# Patient Record
Sex: Female | Born: 1935 | Race: Black or African American | Hispanic: No | Smoking: Former smoker
Health system: Southern US, Community
[De-identification: ages and names within clinical notes are randomized; demographics above are authoritative.]

## PROBLEM LIST (undated history)

## (undated) DIAGNOSIS — E870 Hyperosmolality and hypernatremia: Secondary | ICD-10-CM

## (undated) DIAGNOSIS — Z8543 Personal history of malignant neoplasm of ovary: Secondary | ICD-10-CM

## (undated) DIAGNOSIS — C569 Malignant neoplasm of unspecified ovary: Secondary | ICD-10-CM

## (undated) DIAGNOSIS — C50919 Malignant neoplasm of unspecified site of unspecified female breast: Secondary | ICD-10-CM

## (undated) DIAGNOSIS — J309 Allergic rhinitis, unspecified: Secondary | ICD-10-CM

## (undated) DIAGNOSIS — C801 Malignant (primary) neoplasm, unspecified: Secondary | ICD-10-CM

## (undated) DIAGNOSIS — F329 Major depressive disorder, single episode, unspecified: Secondary | ICD-10-CM

## (undated) DIAGNOSIS — E1165 Type 2 diabetes mellitus with hyperglycemia: Secondary | ICD-10-CM

## (undated) DIAGNOSIS — D638 Anemia in other chronic diseases classified elsewhere: Secondary | ICD-10-CM

## (undated) DIAGNOSIS — G309 Alzheimer's disease, unspecified: Secondary | ICD-10-CM

## (undated) DIAGNOSIS — F32A Depression, unspecified: Secondary | ICD-10-CM

## (undated) DIAGNOSIS — F0280 Dementia in other diseases classified elsewhere without behavioral disturbance: Secondary | ICD-10-CM

## (undated) DIAGNOSIS — E785 Hyperlipidemia, unspecified: Secondary | ICD-10-CM

## (undated) HISTORY — PX: BACK SURGERY: SHX140

## (undated) HISTORY — DX: Allergic rhinitis, unspecified: J30.9

## (undated) HISTORY — DX: Hypocalcemia: E83.51

## (undated) HISTORY — DX: Malignant (primary) neoplasm, unspecified: C80.1

## (undated) HISTORY — DX: Anemia in other chronic diseases classified elsewhere: D63.8

## (undated) HISTORY — DX: Alzheimer's disease, unspecified: G30.9

## (undated) HISTORY — DX: Major depressive disorder, single episode, unspecified: F32.9

## (undated) HISTORY — PX: BREAST SURGERY: SHX581

## (undated) HISTORY — DX: Depression, unspecified: F32.A

## (undated) HISTORY — DX: Personal history of malignant neoplasm of ovary: Z85.43

## (undated) HISTORY — DX: Malignant neoplasm of unspecified ovary: C56.9

## (undated) HISTORY — DX: Hyperlipidemia, unspecified: E78.5

## (undated) HISTORY — PX: KNEE SURGERY: SHX244

## (undated) HISTORY — DX: Dementia in other diseases classified elsewhere without behavioral disturbance: F02.80

## (undated) HISTORY — DX: Type 2 diabetes mellitus with hyperglycemia: E11.65

## (undated) HISTORY — DX: Malignant neoplasm of unspecified site of unspecified female breast: C50.919

## (undated) HISTORY — PX: SKIN CANCER EXCISION: SHX779

---

## 1997-10-25 ENCOUNTER — Encounter: Admission: RE | Admit: 1997-10-25 | Discharge: 1998-01-23 | Payer: Self-pay | Admitting: Internal Medicine

## 1997-11-27 ENCOUNTER — Other Ambulatory Visit: Admission: RE | Admit: 1997-11-27 | Discharge: 1997-11-27 | Payer: Self-pay | Admitting: Obstetrics and Gynecology

## 1997-12-22 ENCOUNTER — Ambulatory Visit (HOSPITAL_COMMUNITY): Admission: RE | Admit: 1997-12-22 | Discharge: 1997-12-22 | Payer: Self-pay | Admitting: Pulmonary Disease

## 1997-12-22 ENCOUNTER — Encounter: Payer: Self-pay | Admitting: Pulmonary Disease

## 1998-01-23 ENCOUNTER — Encounter: Admission: RE | Admit: 1998-01-23 | Discharge: 1998-04-06 | Payer: Self-pay | Admitting: Internal Medicine

## 1998-04-04 ENCOUNTER — Other Ambulatory Visit: Admission: RE | Admit: 1998-04-04 | Discharge: 1998-04-04 | Payer: Self-pay | Admitting: Obstetrics and Gynecology

## 1998-05-22 ENCOUNTER — Encounter: Admission: RE | Admit: 1998-05-22 | Discharge: 1998-08-20 | Payer: Self-pay | Admitting: Pulmonary Disease

## 1999-04-05 ENCOUNTER — Other Ambulatory Visit: Admission: RE | Admit: 1999-04-05 | Discharge: 1999-04-05 | Payer: Self-pay | Admitting: Obstetrics and Gynecology

## 1999-09-23 ENCOUNTER — Other Ambulatory Visit: Admission: RE | Admit: 1999-09-23 | Discharge: 1999-09-23 | Payer: Self-pay | Admitting: Obstetrics and Gynecology

## 2000-05-21 ENCOUNTER — Ambulatory Visit (HOSPITAL_COMMUNITY): Admission: RE | Admit: 2000-05-21 | Discharge: 2000-05-21 | Payer: Self-pay | Admitting: Obstetrics and Gynecology

## 2000-05-21 ENCOUNTER — Encounter: Payer: Self-pay | Admitting: Obstetrics and Gynecology

## 2000-06-17 ENCOUNTER — Encounter (INDEPENDENT_AMBULATORY_CARE_PROVIDER_SITE_OTHER): Payer: Self-pay | Admitting: Specialist

## 2000-06-17 ENCOUNTER — Other Ambulatory Visit: Admission: RE | Admit: 2000-06-17 | Discharge: 2000-06-17 | Payer: Self-pay | Admitting: Gastroenterology

## 2000-06-18 ENCOUNTER — Other Ambulatory Visit: Admission: RE | Admit: 2000-06-18 | Discharge: 2000-06-18 | Payer: Self-pay | Admitting: Obstetrics and Gynecology

## 2000-07-23 ENCOUNTER — Encounter: Payer: Self-pay | Admitting: Neurosurgery

## 2000-07-23 ENCOUNTER — Ambulatory Visit: Admission: RE | Admit: 2000-07-23 | Discharge: 2000-07-23 | Payer: Self-pay | Admitting: Neurosurgery

## 2000-08-17 ENCOUNTER — Encounter: Payer: Self-pay | Admitting: Neurosurgery

## 2000-08-19 ENCOUNTER — Encounter: Payer: Self-pay | Admitting: Neurosurgery

## 2000-08-19 ENCOUNTER — Inpatient Hospital Stay (HOSPITAL_COMMUNITY): Admission: RE | Admit: 2000-08-19 | Discharge: 2000-08-21 | Payer: Self-pay | Admitting: Neurosurgery

## 2000-08-26 ENCOUNTER — Inpatient Hospital Stay (HOSPITAL_COMMUNITY): Admission: RE | Admit: 2000-08-26 | Discharge: 2000-09-02 | Payer: Self-pay | Admitting: Neurosurgery

## 2001-05-25 ENCOUNTER — Ambulatory Visit (HOSPITAL_COMMUNITY): Admission: RE | Admit: 2001-05-25 | Discharge: 2001-05-25 | Payer: Self-pay | Admitting: Obstetrics and Gynecology

## 2001-05-25 ENCOUNTER — Encounter: Payer: Self-pay | Admitting: Obstetrics and Gynecology

## 2001-05-28 ENCOUNTER — Other Ambulatory Visit: Admission: RE | Admit: 2001-05-28 | Discharge: 2001-05-28 | Payer: Self-pay | Admitting: Obstetrics and Gynecology

## 2001-06-03 ENCOUNTER — Encounter: Admission: RE | Admit: 2001-06-03 | Discharge: 2001-06-03 | Payer: Self-pay | Admitting: Obstetrics and Gynecology

## 2001-06-03 ENCOUNTER — Encounter: Payer: Self-pay | Admitting: Obstetrics and Gynecology

## 2001-09-24 ENCOUNTER — Encounter: Payer: Self-pay | Admitting: Orthopedic Surgery

## 2001-09-24 ENCOUNTER — Ambulatory Visit (HOSPITAL_COMMUNITY): Admission: RE | Admit: 2001-09-24 | Discharge: 2001-09-24 | Payer: Self-pay | Admitting: Orthopedic Surgery

## 2001-11-12 ENCOUNTER — Encounter: Payer: Self-pay | Admitting: Orthopedic Surgery

## 2001-11-12 ENCOUNTER — Encounter: Admission: RE | Admit: 2001-11-12 | Discharge: 2001-11-12 | Payer: Self-pay | Admitting: Orthopedic Surgery

## 2001-11-16 ENCOUNTER — Ambulatory Visit (HOSPITAL_BASED_OUTPATIENT_CLINIC_OR_DEPARTMENT_OTHER): Admission: RE | Admit: 2001-11-16 | Discharge: 2001-11-17 | Payer: Self-pay | Admitting: Orthopedic Surgery

## 2001-11-17 ENCOUNTER — Inpatient Hospital Stay (HOSPITAL_COMMUNITY): Admission: AD | Admit: 2001-11-17 | Discharge: 2001-11-19 | Payer: Self-pay | Admitting: Orthopedic Surgery

## 2002-05-30 ENCOUNTER — Ambulatory Visit (HOSPITAL_COMMUNITY): Admission: RE | Admit: 2002-05-30 | Discharge: 2002-05-30 | Payer: Self-pay | Admitting: Obstetrics and Gynecology

## 2002-05-30 ENCOUNTER — Encounter: Payer: Self-pay | Admitting: Obstetrics and Gynecology

## 2002-08-08 ENCOUNTER — Ambulatory Visit (HOSPITAL_BASED_OUTPATIENT_CLINIC_OR_DEPARTMENT_OTHER): Admission: RE | Admit: 2002-08-08 | Discharge: 2002-08-08 | Payer: Self-pay | Admitting: Pulmonary Disease

## 2002-09-01 ENCOUNTER — Ambulatory Visit (HOSPITAL_BASED_OUTPATIENT_CLINIC_OR_DEPARTMENT_OTHER): Admission: RE | Admit: 2002-09-01 | Discharge: 2002-09-01 | Payer: Self-pay | Admitting: Orthopedic Surgery

## 2003-05-29 ENCOUNTER — Encounter: Admission: RE | Admit: 2003-05-29 | Discharge: 2003-06-13 | Payer: Self-pay | Admitting: Neurology

## 2003-05-31 ENCOUNTER — Ambulatory Visit (HOSPITAL_COMMUNITY): Admission: RE | Admit: 2003-05-31 | Discharge: 2003-05-31 | Payer: Self-pay | Admitting: Obstetrics and Gynecology

## 2003-06-07 ENCOUNTER — Encounter: Admission: RE | Admit: 2003-06-07 | Discharge: 2003-06-07 | Payer: Self-pay | Admitting: Obstetrics and Gynecology

## 2003-06-07 ENCOUNTER — Encounter (INDEPENDENT_AMBULATORY_CARE_PROVIDER_SITE_OTHER): Payer: Self-pay | Admitting: *Deleted

## 2003-06-14 ENCOUNTER — Encounter (HOSPITAL_COMMUNITY): Admission: RE | Admit: 2003-06-14 | Discharge: 2003-09-12 | Payer: Self-pay | Admitting: General Surgery

## 2003-07-03 ENCOUNTER — Ambulatory Visit (HOSPITAL_COMMUNITY): Admission: RE | Admit: 2003-07-03 | Discharge: 2003-07-04 | Payer: Self-pay | Admitting: General Surgery

## 2003-07-03 ENCOUNTER — Encounter: Admission: RE | Admit: 2003-07-03 | Discharge: 2003-07-03 | Payer: Self-pay | Admitting: General Surgery

## 2003-07-03 ENCOUNTER — Encounter (INDEPENDENT_AMBULATORY_CARE_PROVIDER_SITE_OTHER): Payer: Self-pay | Admitting: *Deleted

## 2003-07-11 ENCOUNTER — Ambulatory Visit (HOSPITAL_COMMUNITY): Admission: RE | Admit: 2003-07-11 | Discharge: 2003-07-11 | Payer: Self-pay | Admitting: Neurology

## 2003-07-20 ENCOUNTER — Ambulatory Visit (HOSPITAL_COMMUNITY): Admission: RE | Admit: 2003-07-20 | Discharge: 2003-07-20 | Payer: Self-pay | Admitting: Pulmonary Disease

## 2003-07-27 ENCOUNTER — Ambulatory Visit: Admission: RE | Admit: 2003-07-27 | Discharge: 2003-09-27 | Payer: Self-pay | Admitting: Radiation Oncology

## 2003-07-27 ENCOUNTER — Ambulatory Visit (HOSPITAL_COMMUNITY): Admission: RE | Admit: 2003-07-27 | Discharge: 2003-07-27 | Payer: Self-pay | Admitting: Neurosurgery

## 2003-08-03 ENCOUNTER — Inpatient Hospital Stay (HOSPITAL_COMMUNITY): Admission: RE | Admit: 2003-08-03 | Discharge: 2003-08-05 | Payer: Self-pay | Admitting: Neurosurgery

## 2003-08-09 ENCOUNTER — Ambulatory Visit (HOSPITAL_COMMUNITY): Admission: RE | Admit: 2003-08-09 | Discharge: 2003-08-09 | Payer: Self-pay | Admitting: Neurosurgery

## 2003-09-20 ENCOUNTER — Ambulatory Visit (HOSPITAL_COMMUNITY): Admission: RE | Admit: 2003-09-20 | Discharge: 2003-09-20 | Payer: Self-pay | Admitting: Oncology

## 2003-09-21 ENCOUNTER — Encounter: Admission: RE | Admit: 2003-09-21 | Discharge: 2003-09-21 | Payer: Self-pay | Admitting: Oncology

## 2003-10-18 ENCOUNTER — Ambulatory Visit: Admission: RE | Admit: 2003-10-18 | Discharge: 2003-10-18 | Payer: Self-pay | Admitting: Oncology

## 2004-02-07 ENCOUNTER — Ambulatory Visit: Payer: Self-pay | Admitting: Pulmonary Disease

## 2004-03-29 ENCOUNTER — Ambulatory Visit: Payer: Self-pay | Admitting: Oncology

## 2004-05-10 ENCOUNTER — Ambulatory Visit: Payer: Self-pay | Admitting: Pulmonary Disease

## 2004-05-31 ENCOUNTER — Encounter: Admission: RE | Admit: 2004-05-31 | Discharge: 2004-05-31 | Payer: Self-pay | Admitting: Oncology

## 2004-08-08 ENCOUNTER — Ambulatory Visit: Payer: Self-pay | Admitting: Pulmonary Disease

## 2004-08-21 ENCOUNTER — Ambulatory Visit: Payer: Self-pay

## 2004-09-09 ENCOUNTER — Ambulatory Visit: Payer: Self-pay | Admitting: Pulmonary Disease

## 2004-10-03 ENCOUNTER — Ambulatory Visit: Payer: Self-pay | Admitting: Oncology

## 2004-11-13 ENCOUNTER — Ambulatory Visit: Payer: Self-pay | Admitting: Pulmonary Disease

## 2005-02-05 ENCOUNTER — Ambulatory Visit: Payer: Self-pay | Admitting: Oncology

## 2005-02-11 ENCOUNTER — Ambulatory Visit: Payer: Self-pay | Admitting: Pulmonary Disease

## 2005-04-22 ENCOUNTER — Ambulatory Visit: Payer: Self-pay | Admitting: Gastroenterology

## 2005-04-28 ENCOUNTER — Ambulatory Visit: Payer: Self-pay | Admitting: Gastroenterology

## 2005-04-28 ENCOUNTER — Encounter (INDEPENDENT_AMBULATORY_CARE_PROVIDER_SITE_OTHER): Payer: Self-pay | Admitting: Specialist

## 2005-06-02 ENCOUNTER — Encounter: Admission: RE | Admit: 2005-06-02 | Discharge: 2005-06-02 | Payer: Self-pay | Admitting: Oncology

## 2005-06-04 ENCOUNTER — Ambulatory Visit: Payer: Self-pay | Admitting: Pulmonary Disease

## 2005-06-09 ENCOUNTER — Ambulatory Visit: Payer: Self-pay

## 2005-07-28 ENCOUNTER — Ambulatory Visit: Payer: Self-pay | Admitting: Pulmonary Disease

## 2005-08-04 ENCOUNTER — Ambulatory Visit: Payer: Self-pay | Admitting: Pulmonary Disease

## 2005-08-07 ENCOUNTER — Ambulatory Visit (HOSPITAL_COMMUNITY): Admission: RE | Admit: 2005-08-07 | Discharge: 2005-08-07 | Payer: Self-pay | Admitting: Pulmonary Disease

## 2005-08-07 ENCOUNTER — Ambulatory Visit: Payer: Self-pay | Admitting: Pulmonary Disease

## 2005-08-15 ENCOUNTER — Ambulatory Visit: Payer: Self-pay | Admitting: Oncology

## 2005-08-15 LAB — CBC WITH DIFFERENTIAL/PLATELET
BASO%: 1.6 % (ref 0.0–2.0)
EOS%: 1.6 % (ref 0.0–7.0)
HCT: 38.2 % (ref 34.8–46.6)
LYMPH%: 24.5 % (ref 14.0–48.0)
MCH: 30 pg (ref 26.0–34.0)
MCHC: 33.3 g/dL (ref 32.0–36.0)
MCV: 90.1 fL (ref 81.0–101.0)
MONO#: 0.3 10*3/uL (ref 0.1–0.9)
MONO%: 7.6 % (ref 0.0–13.0)
NEUT%: 64.7 % (ref 39.6–76.8)
Platelets: 269 10*3/uL (ref 145–400)
RBC: 4.24 10*6/uL (ref 3.70–5.32)
WBC: 4.5 10*3/uL (ref 3.9–10.0)

## 2005-08-15 LAB — COMPREHENSIVE METABOLIC PANEL
ALT: 8 U/L (ref 0–40)
Alkaline Phosphatase: 60 U/L (ref 39–117)
CO2: 29 mEq/L (ref 19–32)
Creatinine, Ser: 0.73 mg/dL (ref 0.40–1.20)
Sodium: 139 mEq/L (ref 135–145)
Total Bilirubin: 0.4 mg/dL (ref 0.3–1.2)
Total Protein: 6.5 g/dL (ref 6.0–8.3)

## 2005-08-15 LAB — CANCER ANTIGEN 27.29: CA 27.29: 17 U/mL (ref 0–39)

## 2005-09-24 ENCOUNTER — Encounter: Admission: RE | Admit: 2005-09-24 | Discharge: 2005-09-24 | Payer: Self-pay | Admitting: Oncology

## 2005-10-07 ENCOUNTER — Ambulatory Visit: Payer: Self-pay | Admitting: Pulmonary Disease

## 2005-11-19 ENCOUNTER — Ambulatory Visit: Payer: Self-pay | Admitting: Pulmonary Disease

## 2005-12-31 ENCOUNTER — Ambulatory Visit: Payer: Self-pay | Admitting: Pulmonary Disease

## 2006-01-05 ENCOUNTER — Ambulatory Visit: Payer: Self-pay | Admitting: Endocrinology

## 2006-01-13 ENCOUNTER — Ambulatory Visit: Payer: Self-pay | Admitting: Endocrinology

## 2006-02-04 ENCOUNTER — Ambulatory Visit: Payer: Self-pay | Admitting: Endocrinology

## 2006-03-20 ENCOUNTER — Ambulatory Visit: Payer: Self-pay | Admitting: Oncology

## 2006-03-25 LAB — CBC WITH DIFFERENTIAL/PLATELET
BASO%: 0.4 % (ref 0.0–2.0)
EOS%: 0.9 % (ref 0.0–7.0)
HCT: 40.6 % (ref 34.8–46.6)
LYMPH%: 16.7 % (ref 14.0–48.0)
MCH: 28.1 pg (ref 26.0–34.0)
MCHC: 31 g/dL — ABNORMAL LOW (ref 32.0–36.0)
MONO#: 0.3 10*3/uL (ref 0.1–0.9)
NEUT%: 77 % — ABNORMAL HIGH (ref 39.6–76.8)
RBC: 4.47 10*6/uL (ref 3.70–5.32)
WBC: 6.9 10*3/uL (ref 3.9–10.0)
lymph#: 1.1 10*3/uL (ref 0.9–3.3)

## 2006-03-25 LAB — COMPREHENSIVE METABOLIC PANEL
ALT: <8 U/L (ref 0–35)
AST: 10 U/L (ref 0–37)
CO2: 27 mEq/L (ref 19–32)
Chloride: 106 mEq/L (ref 96–112)
Creatinine, Ser: 0.73 mg/dL (ref 0.40–1.20)
Sodium: 144 mEq/L (ref 135–145)
Total Bilirubin: 0.4 mg/dL (ref 0.3–1.2)
Total Protein: 6 g/dL (ref 6.0–8.3)

## 2006-03-25 LAB — LACTATE DEHYDROGENASE: LDH: 163 U/L (ref 94–250)

## 2006-04-16 ENCOUNTER — Ambulatory Visit: Payer: Self-pay | Admitting: Pulmonary Disease

## 2006-04-23 ENCOUNTER — Ambulatory Visit: Payer: Self-pay | Admitting: Pulmonary Disease

## 2006-05-19 ENCOUNTER — Ambulatory Visit: Payer: Self-pay | Admitting: Licensed Clinical Social Worker

## 2006-05-19 ENCOUNTER — Ambulatory Visit: Payer: Self-pay | Admitting: Endocrinology

## 2006-06-04 ENCOUNTER — Encounter: Admission: RE | Admit: 2006-06-04 | Discharge: 2006-06-04 | Payer: Self-pay | Admitting: Oncology

## 2006-06-30 ENCOUNTER — Ambulatory Visit: Payer: Self-pay | Admitting: Endocrinology

## 2006-07-01 ENCOUNTER — Ambulatory Visit: Payer: Self-pay | Admitting: Endocrinology

## 2006-07-14 ENCOUNTER — Ambulatory Visit: Payer: Self-pay | Admitting: Endocrinology

## 2006-08-25 ENCOUNTER — Ambulatory Visit: Payer: Self-pay | Admitting: Endocrinology

## 2006-09-08 ENCOUNTER — Ambulatory Visit: Payer: Self-pay | Admitting: Endocrinology

## 2006-09-14 ENCOUNTER — Ambulatory Visit: Payer: Self-pay | Admitting: Oncology

## 2006-09-16 LAB — COMPREHENSIVE METABOLIC PANEL
AST: 15 U/L (ref 0–37)
Albumin: 4 g/dL (ref 3.5–5.2)
Alkaline Phosphatase: 76 U/L (ref 39–117)
BUN: 19 mg/dL (ref 6–23)
Glucose, Bld: 49 mg/dL — ABNORMAL LOW (ref 70–99)
Potassium: 4.1 mEq/L (ref 3.5–5.3)
Sodium: 141 mEq/L (ref 135–145)
Total Bilirubin: 0.3 mg/dL (ref 0.3–1.2)
Total Protein: 6.7 g/dL (ref 6.0–8.3)

## 2006-09-16 LAB — CBC WITH DIFFERENTIAL/PLATELET
EOS%: 0.6 % (ref 0.0–7.0)
LYMPH%: 24.1 % (ref 14.0–48.0)
MCH: 29.8 pg (ref 26.0–34.0)
MCV: 86.9 fL (ref 81.0–101.0)
MONO%: 5.3 % (ref 0.0–13.0)
Platelets: 294 10*3/uL (ref 145–400)
RBC: 4.48 10*6/uL (ref 3.70–5.32)
RDW: 15.1 % — ABNORMAL HIGH (ref 11.3–14.5)

## 2006-09-16 LAB — CANCER ANTIGEN 27.29: CA 27.29: 6 U/mL (ref 0–39)

## 2006-09-29 ENCOUNTER — Ambulatory Visit: Payer: Self-pay | Admitting: Endocrinology

## 2006-10-29 ENCOUNTER — Ambulatory Visit: Payer: Self-pay | Admitting: Endocrinology

## 2006-11-02 ENCOUNTER — Ambulatory Visit: Payer: Self-pay | Admitting: Pulmonary Disease

## 2006-11-02 LAB — CONVERTED CEMR LAB
ALT: 11 units/L (ref 0–35)
Albumin: 3.5 g/dL (ref 3.5–5.2)
Alkaline Phosphatase: 74 units/L (ref 39–117)
BUN: 15 mg/dL (ref 6–23)
Basophils Absolute: 0 10*3/uL (ref 0.0–0.1)
Bilirubin, Direct: 0.1 mg/dL (ref 0.0–0.3)
CO2: 30 meq/L (ref 19–32)
Creatinine, Ser: 0.8 mg/dL (ref 0.4–1.2)
Eosinophils Absolute: 0.1 10*3/uL (ref 0.0–0.6)
Eosinophils Relative: 1.4 % (ref 0.0–5.0)
GFR calc Af Amer: 90 mL/min
GFR calc non Af Amer: 74 mL/min
Lymphocytes Relative: 26.3 % (ref 12.0–46.0)
MCV: 88.7 fL (ref 78.0–100.0)
Monocytes Absolute: 0.6 10*3/uL (ref 0.2–0.7)
Neutrophils Relative %: 61.8 % (ref 43.0–77.0)
Potassium: 4.2 meq/L (ref 3.5–5.1)
Sodium: 143 meq/L (ref 135–145)
Total Bilirubin: 0.7 mg/dL (ref 0.3–1.2)
Total Protein: 6.5 g/dL (ref 6.0–8.3)
WBC: 5.7 10*3/uL (ref 4.5–10.5)

## 2006-12-22 ENCOUNTER — Encounter: Payer: Self-pay | Admitting: Pulmonary Disease

## 2007-01-16 DIAGNOSIS — E119 Type 2 diabetes mellitus without complications: Secondary | ICD-10-CM | POA: Insufficient documentation

## 2007-01-16 DIAGNOSIS — K5730 Diverticulosis of large intestine without perforation or abscess without bleeding: Secondary | ICD-10-CM | POA: Insufficient documentation

## 2007-01-16 DIAGNOSIS — F411 Generalized anxiety disorder: Secondary | ICD-10-CM

## 2007-01-16 DIAGNOSIS — H409 Unspecified glaucoma: Secondary | ICD-10-CM | POA: Insufficient documentation

## 2007-01-16 DIAGNOSIS — J209 Acute bronchitis, unspecified: Secondary | ICD-10-CM | POA: Insufficient documentation

## 2007-01-16 DIAGNOSIS — M1990 Unspecified osteoarthritis, unspecified site: Secondary | ICD-10-CM | POA: Insufficient documentation

## 2007-01-16 DIAGNOSIS — R609 Edema, unspecified: Secondary | ICD-10-CM | POA: Insufficient documentation

## 2007-01-16 DIAGNOSIS — I872 Venous insufficiency (chronic) (peripheral): Secondary | ICD-10-CM | POA: Insufficient documentation

## 2007-01-16 DIAGNOSIS — M545 Low back pain: Secondary | ICD-10-CM | POA: Insufficient documentation

## 2007-01-16 DIAGNOSIS — D126 Benign neoplasm of colon, unspecified: Secondary | ICD-10-CM | POA: Insufficient documentation

## 2007-01-16 DIAGNOSIS — E039 Hypothyroidism, unspecified: Secondary | ICD-10-CM | POA: Insufficient documentation

## 2007-03-16 ENCOUNTER — Ambulatory Visit: Payer: Self-pay | Admitting: Oncology

## 2007-03-19 LAB — COMPREHENSIVE METABOLIC PANEL
ALT: 9 U/L (ref 0–35)
BUN: 14 mg/dL (ref 6–23)
CO2: 30 mEq/L (ref 19–32)
Calcium: 8.2 mg/dL — ABNORMAL LOW (ref 8.4–10.5)
Chloride: 101 mEq/L (ref 96–112)
Creatinine, Ser: 0.82 mg/dL (ref 0.40–1.20)
Glucose, Bld: 280 mg/dL — ABNORMAL HIGH (ref 70–99)
Total Bilirubin: 0.4 mg/dL (ref 0.3–1.2)

## 2007-03-19 LAB — CBC WITH DIFFERENTIAL/PLATELET
Basophils Absolute: 0 10*3/uL (ref 0.0–0.1)
Eosinophils Absolute: 0.1 10*3/uL (ref 0.0–0.5)
HCT: 39.2 % (ref 34.8–46.6)
HGB: 13.4 g/dL (ref 11.6–15.9)
LYMPH%: 19.6 % (ref 14.0–48.0)
MCHC: 34.1 g/dL (ref 32.0–36.0)
MONO#: 0.2 10*3/uL (ref 0.1–0.9)
NEUT%: 75.5 % (ref 39.6–76.8)
Platelets: 310 10*3/uL (ref 145–400)
WBC: 6.7 10*3/uL (ref 3.9–10.0)
lymph#: 1.3 10*3/uL (ref 0.9–3.3)

## 2007-03-26 ENCOUNTER — Encounter: Payer: Self-pay | Admitting: Pulmonary Disease

## 2007-04-17 ENCOUNTER — Emergency Department (HOSPITAL_COMMUNITY): Admission: EM | Admit: 2007-04-17 | Discharge: 2007-04-17 | Payer: Self-pay | Admitting: Emergency Medicine

## 2007-04-22 ENCOUNTER — Encounter: Payer: Self-pay | Admitting: Pulmonary Disease

## 2007-05-21 ENCOUNTER — Emergency Department (HOSPITAL_COMMUNITY): Admission: EM | Admit: 2007-05-21 | Discharge: 2007-05-21 | Payer: Self-pay | Admitting: Emergency Medicine

## 2007-06-07 ENCOUNTER — Encounter: Admission: RE | Admit: 2007-06-07 | Discharge: 2007-06-07 | Payer: Self-pay | Admitting: Oncology

## 2007-07-10 ENCOUNTER — Emergency Department (HOSPITAL_COMMUNITY): Admission: EM | Admit: 2007-07-10 | Discharge: 2007-07-10 | Payer: Self-pay | Admitting: Emergency Medicine

## 2007-08-30 ENCOUNTER — Ambulatory Visit: Payer: Self-pay | Admitting: Gastroenterology

## 2007-09-15 ENCOUNTER — Ambulatory Visit: Payer: Self-pay | Admitting: Gastroenterology

## 2007-09-15 ENCOUNTER — Encounter: Payer: Self-pay | Admitting: Gastroenterology

## 2007-09-20 ENCOUNTER — Ambulatory Visit: Payer: Self-pay | Admitting: Oncology

## 2007-09-20 ENCOUNTER — Encounter: Payer: Self-pay | Admitting: Gastroenterology

## 2007-09-23 LAB — CBC WITH DIFFERENTIAL/PLATELET
BASO%: 0.7 % (ref 0.0–2.0)
Eosinophils Absolute: 0.1 10*3/uL (ref 0.0–0.5)
LYMPH%: 18.1 % (ref 14.0–48.0)
MCHC: 34.1 g/dL (ref 32.0–36.0)
MCV: 88.7 fL (ref 81.0–101.0)
MONO%: 5.4 % (ref 0.0–13.0)
NEUT#: 5.9 10*3/uL (ref 1.5–6.5)
Platelets: 300 10*3/uL (ref 145–400)
RBC: 4.05 10*6/uL (ref 3.70–5.32)
RDW: 15.9 % — ABNORMAL HIGH (ref 11.3–14.5)
WBC: 7.9 10*3/uL (ref 3.9–10.0)

## 2007-09-24 LAB — COMPREHENSIVE METABOLIC PANEL
ALT: 8 U/L (ref 0–35)
AST: 10 U/L (ref 0–37)
Albumin: 4 g/dL (ref 3.5–5.2)
CO2: 27 mEq/L (ref 19–32)
Calcium: 7.6 mg/dL — ABNORMAL LOW (ref 8.4–10.5)
Chloride: 104 mEq/L (ref 96–112)
Creatinine, Ser: 0.78 mg/dL (ref 0.40–1.20)
Potassium: 4.4 mEq/L (ref 3.5–5.3)

## 2007-09-24 LAB — LACTATE DEHYDROGENASE: LDH: 148 U/L (ref 94–250)

## 2007-10-05 ENCOUNTER — Encounter: Admission: RE | Admit: 2007-10-05 | Discharge: 2007-10-05 | Payer: Self-pay | Admitting: Oncology

## 2007-12-26 ENCOUNTER — Emergency Department (HOSPITAL_COMMUNITY): Admission: EM | Admit: 2007-12-26 | Discharge: 2007-12-26 | Payer: Self-pay | Admitting: Emergency Medicine

## 2007-12-28 ENCOUNTER — Emergency Department (HOSPITAL_COMMUNITY): Admission: EM | Admit: 2007-12-28 | Discharge: 2007-12-28 | Payer: Self-pay | Admitting: Emergency Medicine

## 2008-02-17 ENCOUNTER — Ambulatory Visit: Payer: Self-pay | Admitting: Psychology

## 2008-04-05 ENCOUNTER — Ambulatory Visit: Payer: Self-pay | Admitting: Oncology

## 2008-04-11 LAB — CBC WITH DIFFERENTIAL/PLATELET
BASO%: 0.5 % (ref 0.0–2.0)
HCT: 36.3 % (ref 34.8–46.6)
MCHC: 33.8 g/dL (ref 32.0–36.0)
MONO#: 0.4 10*3/uL (ref 0.1–0.9)
RBC: 3.93 10*6/uL (ref 3.70–5.32)
WBC: 6.9 10*3/uL (ref 3.9–10.0)
lymph#: 1.2 10*3/uL (ref 0.9–3.3)

## 2008-04-12 LAB — COMPREHENSIVE METABOLIC PANEL
CO2: 27 mEq/L (ref 19–32)
Calcium: 9.1 mg/dL (ref 8.4–10.5)
Chloride: 101 mEq/L (ref 96–112)
Creatinine, Ser: 0.91 mg/dL (ref 0.40–1.20)
Glucose, Bld: 150 mg/dL — ABNORMAL HIGH (ref 70–99)
Total Bilirubin: 0.4 mg/dL (ref 0.3–1.2)
Total Protein: 6.7 g/dL (ref 6.0–8.3)

## 2008-04-12 LAB — LACTATE DEHYDROGENASE: LDH: 135 U/L (ref 94–250)

## 2008-04-12 LAB — CANCER ANTIGEN 27.29: CA 27.29: 14 U/mL (ref 0–39)

## 2008-05-10 IMAGING — CR DG FOOT COMPLETE 3+V*R*
3 series · 3 of 3 positions shown · non-contrast
Comparison: none

CLINICAL DATA: Pain.  Fall.
 RIGHT FOOT ? 3 VIEWS:
 There is a fracture at the base of the 5th metatarsal which is nondisplaced.  Patient has a hallux valgus deformity with associated soft tissue bunion.  Prominent accessory ossicle of the navicular bone noted.  The foot appears swollen.

[view not recorded (1 of 3)]
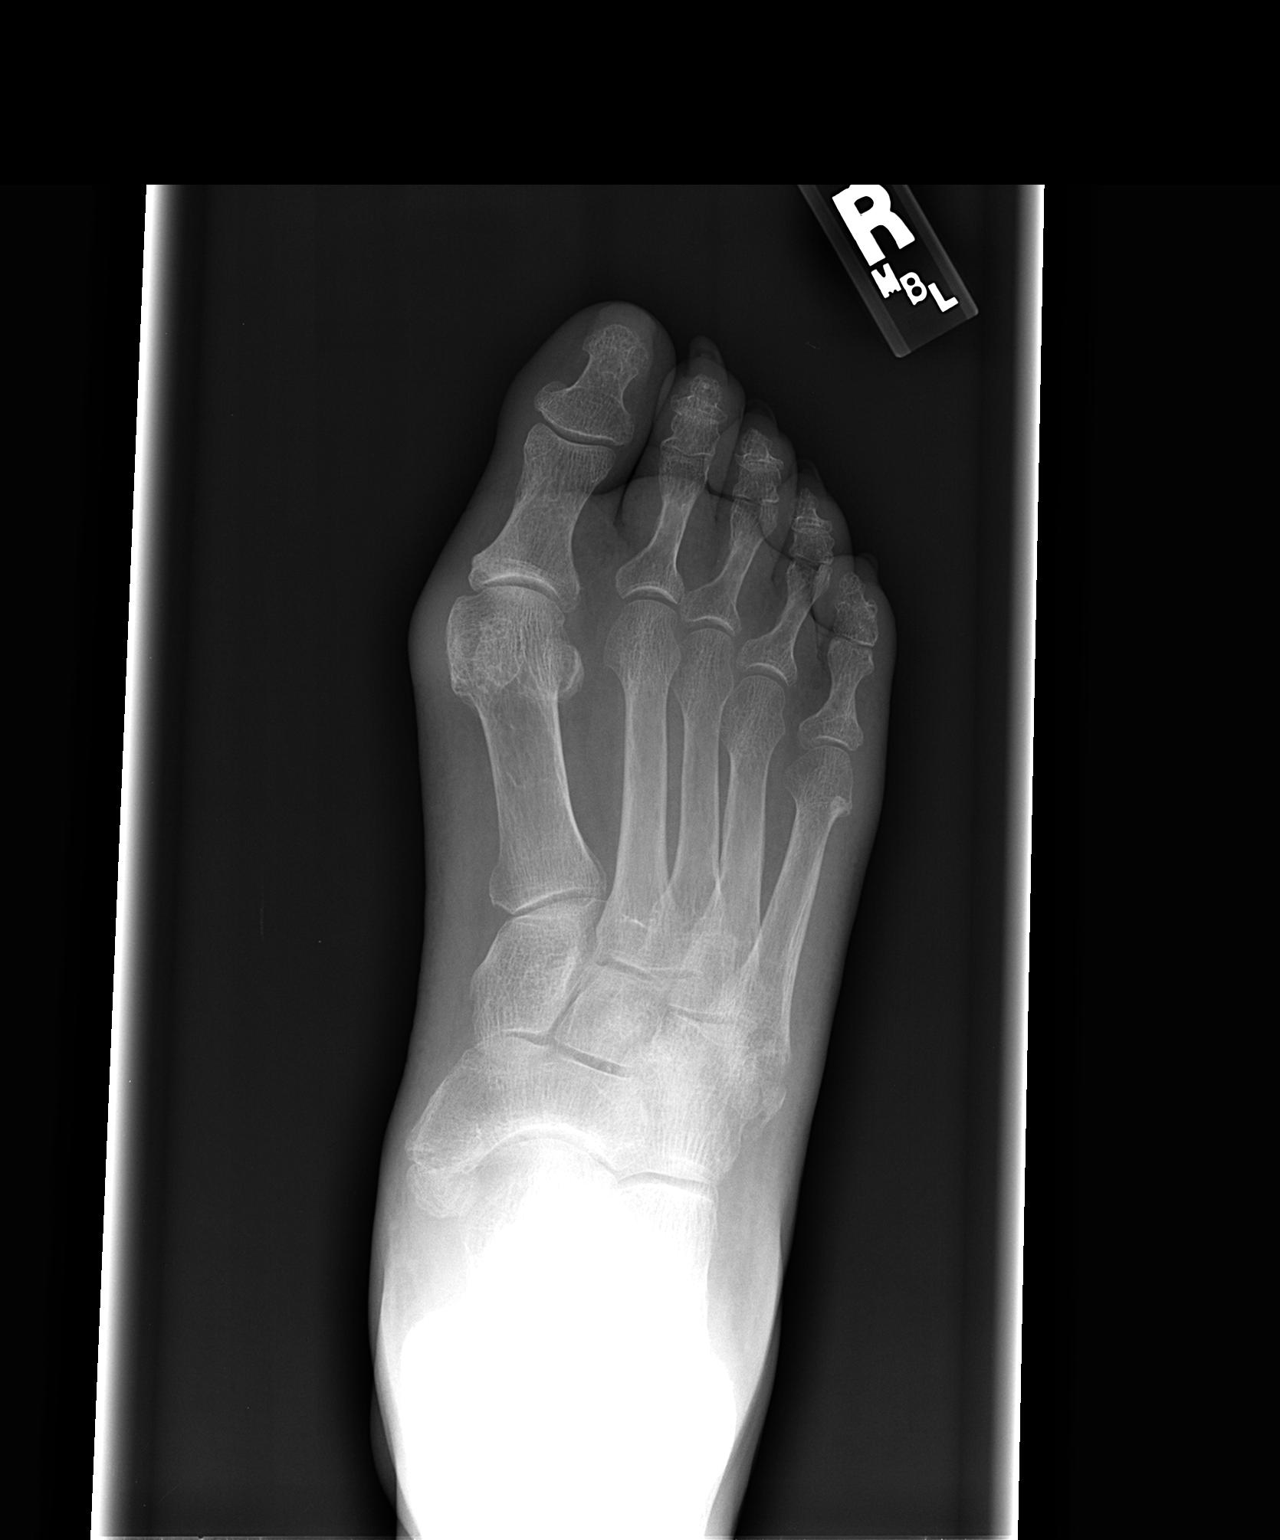

[view not recorded (2 of 3)]
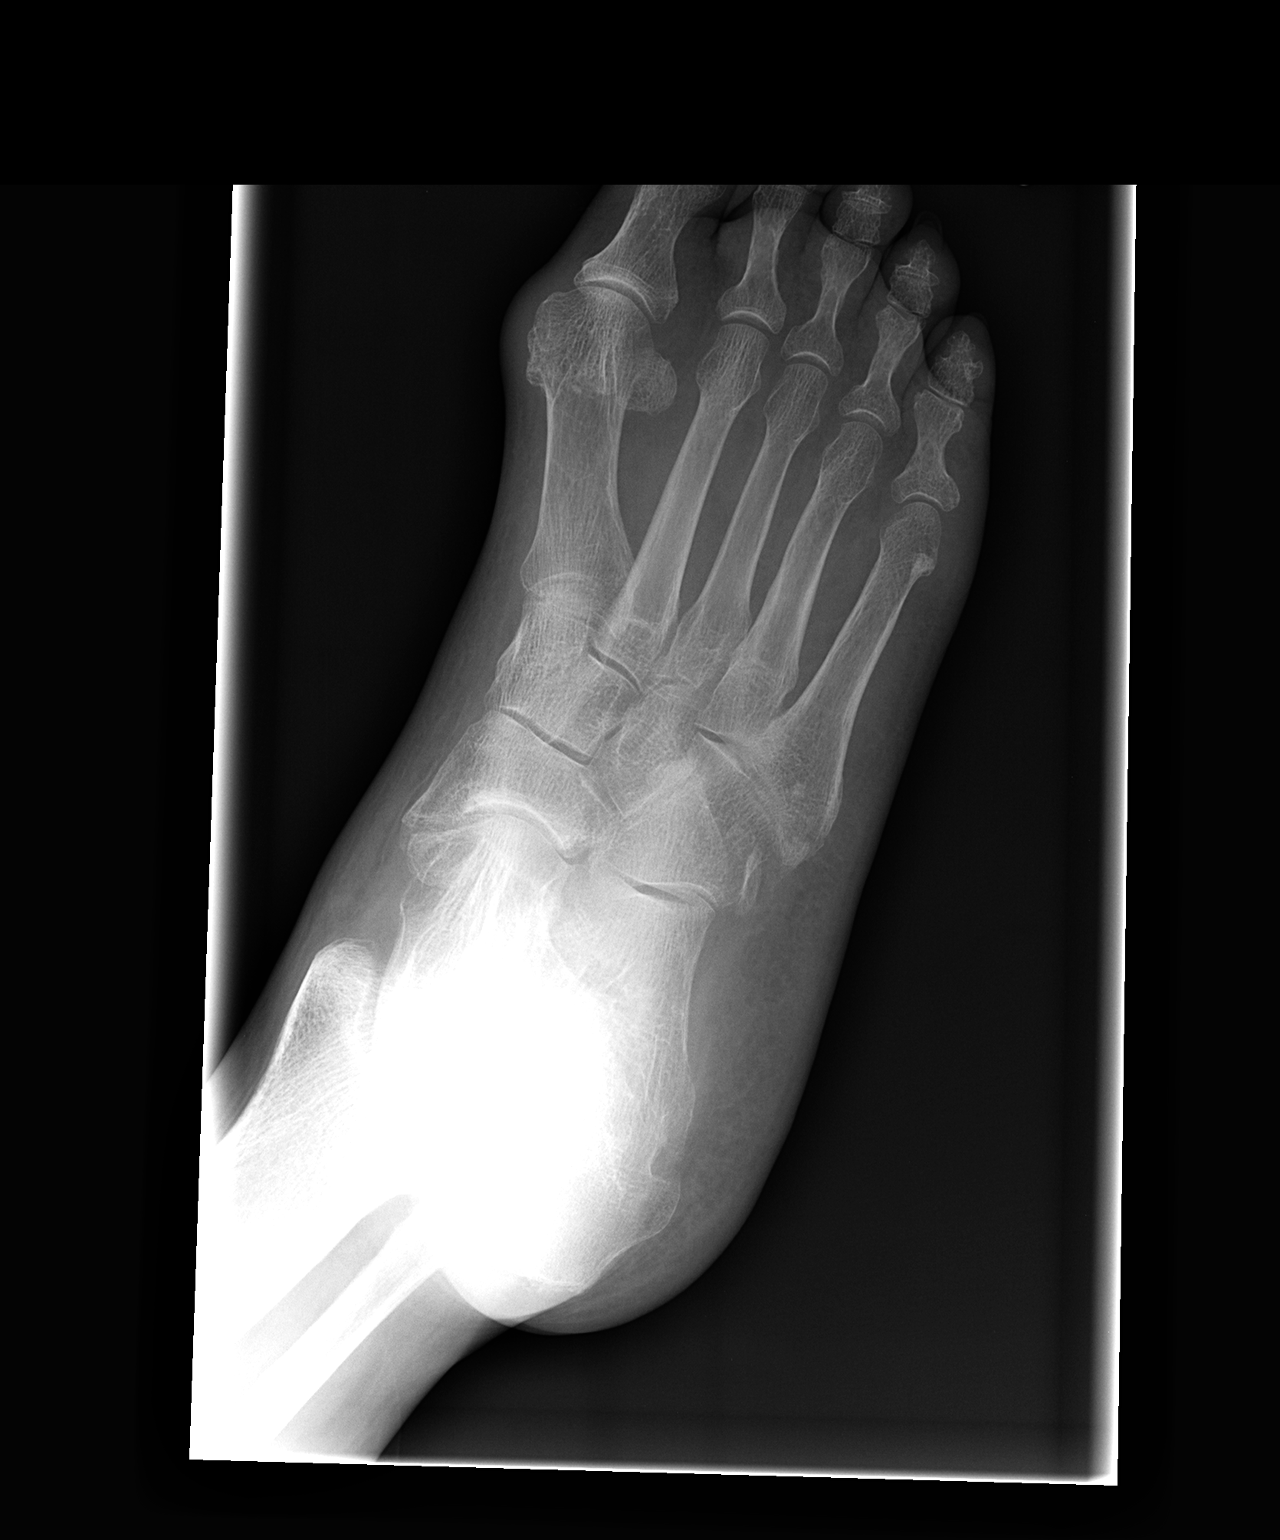

[view not recorded (3 of 3)]
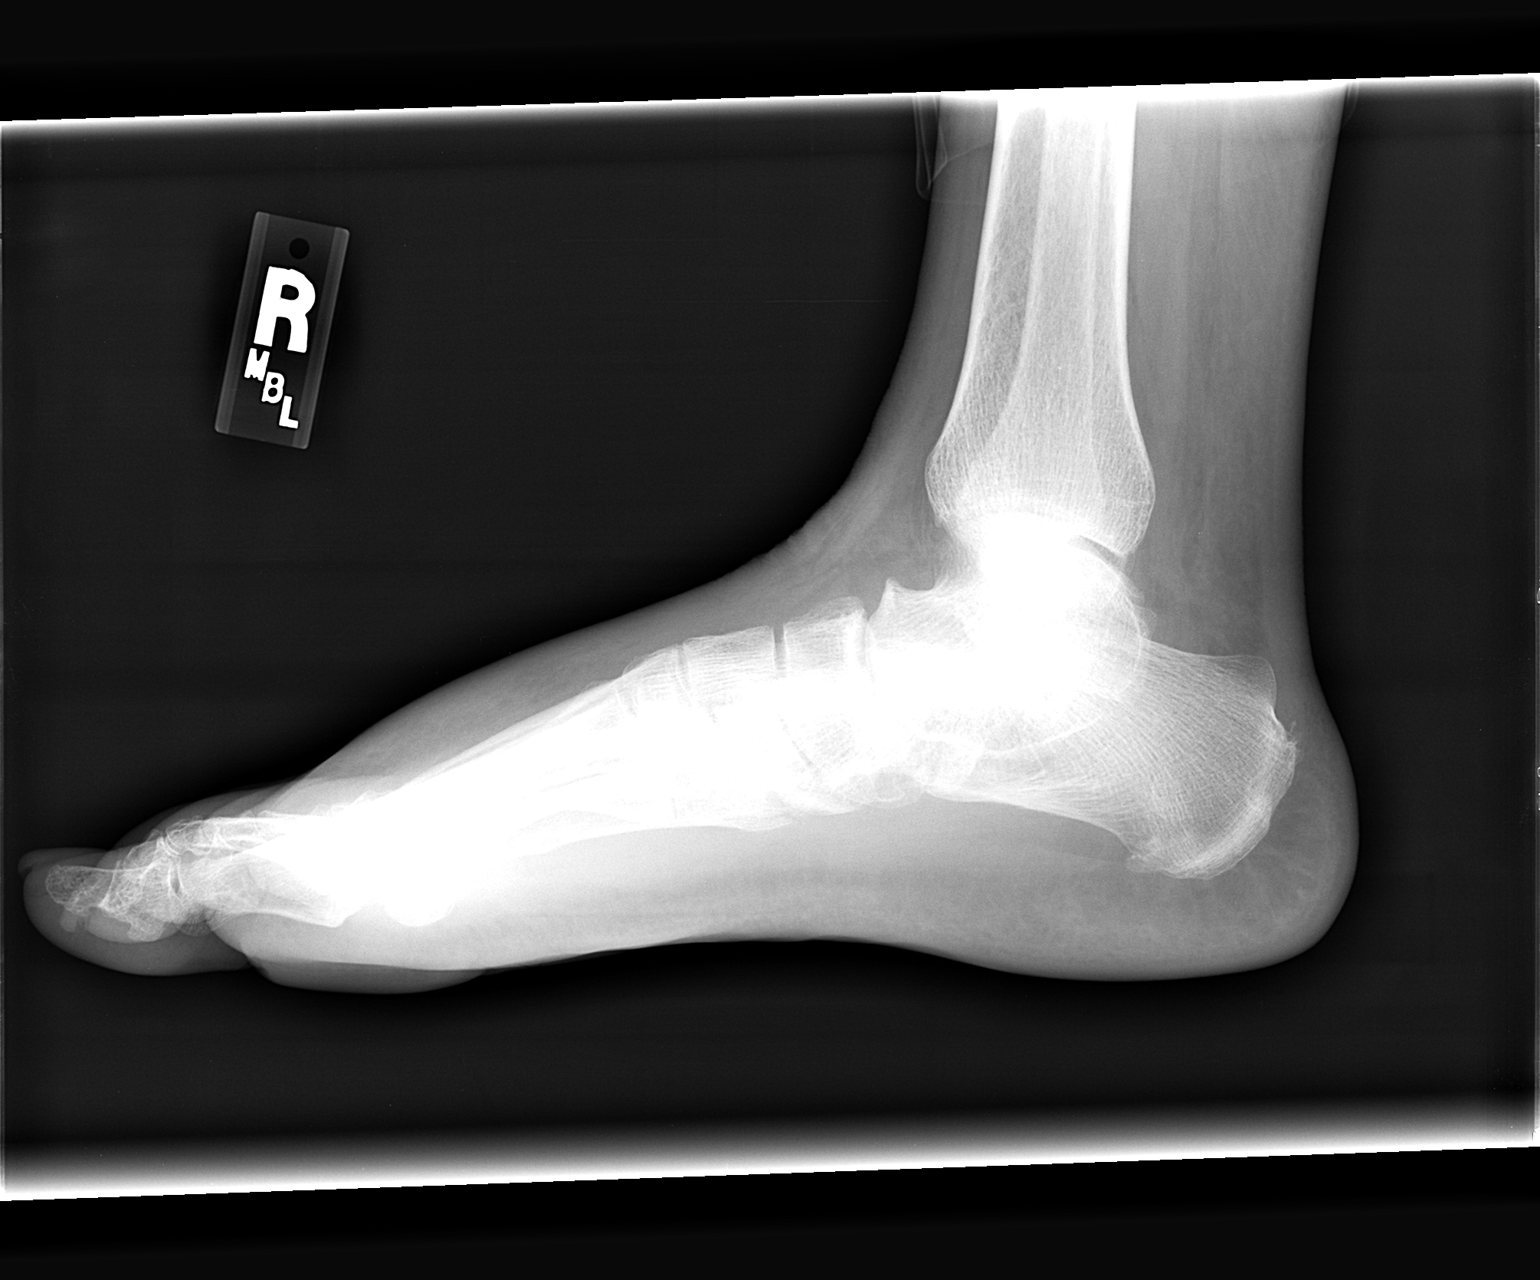

[3 of 3 positions shown; findings below may reference images not displayed]

IMPRESSION: 1.  Fracture base of 5th metatarsal.
 2.  Diffuse soft tissue swelling of the foot.
 3.  Hallux valgus.

## 2008-06-07 ENCOUNTER — Encounter: Admission: RE | Admit: 2008-06-07 | Discharge: 2008-06-07 | Payer: Self-pay | Admitting: Oncology

## 2008-06-20 ENCOUNTER — Encounter: Payer: Self-pay | Admitting: Gastroenterology

## 2008-06-29 DIAGNOSIS — Z860100 Personal history of colon polyps, unspecified: Secondary | ICD-10-CM | POA: Insufficient documentation

## 2008-06-29 DIAGNOSIS — C569 Malignant neoplasm of unspecified ovary: Secondary | ICD-10-CM | POA: Insufficient documentation

## 2008-06-30 ENCOUNTER — Ambulatory Visit: Payer: Self-pay | Admitting: Gastroenterology

## 2008-06-30 DIAGNOSIS — K219 Gastro-esophageal reflux disease without esophagitis: Secondary | ICD-10-CM

## 2008-06-30 DIAGNOSIS — C50919 Malignant neoplasm of unspecified site of unspecified female breast: Secondary | ICD-10-CM

## 2008-06-30 LAB — CONVERTED CEMR LAB
Albumin: 3.5 g/dL (ref 3.5–5.2)
BUN: 12 mg/dL (ref 6–23)
Basophils Absolute: 0 10*3/uL (ref 0.0–0.1)
Basophils Relative: 0.4 % (ref 0.0–3.0)
Creatinine, Ser: 0.8 mg/dL (ref 0.4–1.2)
Eosinophils Absolute: 0 10*3/uL (ref 0.0–0.7)
Ferritin: 17.9 ng/mL (ref 10.0–291.0)
GFR calc non Af Amer: 89.1 mL/min (ref >60)
Glucose, Bld: 95 mg/dL (ref 70–99)
Iron: 62 ug/dL (ref 42–145)
Lymphocytes Relative: 18.9 % (ref 12.0–46.0)
MCHC: 33.5 g/dL (ref 30.0–36.0)
Monocytes Absolute: 0.4 10*3/uL (ref 0.1–1.0)
Monocytes Relative: 6 % (ref 3.0–12.0)
Neutro Abs: 4.5 10*3/uL (ref 1.4–7.7)
Neutrophils Relative %: 74.5 % (ref 43.0–77.0)
Platelets: 244 10*3/uL (ref 150.0–400.0)
Potassium: 3.8 meq/L (ref 3.5–5.1)
Sed Rate: 20 mm/hr (ref 0–22)
Sodium: 143 meq/L (ref 135–145)
Vitamin B-12: 348 pg/mL (ref 211–911)

## 2008-07-14 ENCOUNTER — Telehealth: Payer: Self-pay | Admitting: Gastroenterology

## 2008-07-16 ENCOUNTER — Encounter: Payer: Self-pay | Admitting: Gastroenterology

## 2008-07-24 ENCOUNTER — Telehealth: Payer: Self-pay | Admitting: Gastroenterology

## 2008-07-26 ENCOUNTER — Encounter: Payer: Self-pay | Admitting: Gastroenterology

## 2008-07-26 ENCOUNTER — Ambulatory Visit: Payer: Self-pay | Admitting: Gastroenterology

## 2008-07-26 DIAGNOSIS — K2990 Gastroduodenitis, unspecified, without bleeding: Secondary | ICD-10-CM

## 2008-07-26 DIAGNOSIS — K2970 Gastritis, unspecified, without bleeding: Secondary | ICD-10-CM

## 2008-07-26 LAB — CONVERTED CEMR LAB: UREASE: NEGATIVE

## 2008-07-28 ENCOUNTER — Encounter: Payer: Self-pay | Admitting: Gastroenterology

## 2008-08-09 ENCOUNTER — Encounter: Payer: Self-pay | Admitting: Gastroenterology

## 2008-08-09 ENCOUNTER — Emergency Department (HOSPITAL_COMMUNITY): Admission: EM | Admit: 2008-08-09 | Discharge: 2008-08-09 | Payer: Self-pay | Admitting: Emergency Medicine

## 2008-08-29 ENCOUNTER — Ambulatory Visit: Payer: Self-pay | Admitting: Gastroenterology

## 2008-08-29 DIAGNOSIS — R413 Other amnesia: Secondary | ICD-10-CM | POA: Insufficient documentation

## 2008-09-29 ENCOUNTER — Encounter: Admission: RE | Admit: 2008-09-29 | Discharge: 2008-09-29 | Payer: Self-pay | Admitting: Internal Medicine

## 2008-10-03 ENCOUNTER — Ambulatory Visit: Payer: Self-pay | Admitting: Oncology

## 2008-10-05 LAB — CBC WITH DIFFERENTIAL/PLATELET
Basophils Absolute: 0 10*3/uL (ref 0.0–0.1)
EOS%: 1.5 % (ref 0.0–7.0)
HCT: 34.6 % — ABNORMAL LOW (ref 34.8–46.6)
HGB: 11.5 g/dL — ABNORMAL LOW (ref 11.6–15.9)
LYMPH%: 29.1 % (ref 14.0–49.7)
MCH: 29.9 pg (ref 25.1–34.0)
MCV: 90.1 fL (ref 79.5–101.0)
NEUT%: 62.3 % (ref 38.4–76.8)
Platelets: 262 10*3/uL (ref 145–400)
lymph#: 1.4 10*3/uL (ref 0.9–3.3)

## 2008-10-06 LAB — COMPREHENSIVE METABOLIC PANEL
AST: 11 U/L (ref 0–37)
BUN: 16 mg/dL (ref 6–23)
Calcium: 8.4 mg/dL (ref 8.4–10.5)
Chloride: 107 mEq/L (ref 96–112)
Creatinine, Ser: 0.98 mg/dL (ref 0.40–1.20)
Total Bilirubin: 0.4 mg/dL (ref 0.3–1.2)

## 2008-10-06 LAB — LACTATE DEHYDROGENASE: LDH: 140 U/L (ref 94–250)

## 2008-10-06 LAB — VITAMIN D 25 HYDROXY (VIT D DEFICIENCY, FRACTURES): Vit D, 25-Hydroxy: 26 ng/mL — ABNORMAL LOW (ref 30–89)

## 2008-11-22 ENCOUNTER — Encounter: Admission: RE | Admit: 2008-11-22 | Discharge: 2008-11-22 | Payer: Self-pay | Admitting: Oncology

## 2009-05-18 ENCOUNTER — Inpatient Hospital Stay (HOSPITAL_COMMUNITY): Admission: RE | Admit: 2009-05-18 | Discharge: 2009-05-22 | Payer: Self-pay | Admitting: Orthopedic Surgery

## 2009-06-22 ENCOUNTER — Inpatient Hospital Stay (HOSPITAL_COMMUNITY): Admission: RE | Admit: 2009-06-22 | Discharge: 2009-06-25 | Payer: Self-pay | Admitting: Orthopedic Surgery

## 2009-08-20 ENCOUNTER — Encounter: Admission: RE | Admit: 2009-08-20 | Discharge: 2009-08-20 | Payer: Self-pay | Admitting: General Surgery

## 2010-04-07 ENCOUNTER — Encounter: Payer: Self-pay | Admitting: Oncology

## 2010-04-08 ENCOUNTER — Encounter: Payer: Self-pay | Admitting: Internal Medicine

## 2010-04-08 ENCOUNTER — Encounter: Payer: Self-pay | Admitting: Oncology

## 2010-04-16 NOTE — Procedures (Signed)
Summary: Jill Francis  Jill Francis   Imported By: Lennie Odor 08/17/2009 15:18:23  _____________________________________________________________________  External Attachment:    Type:   Image     Comment:   External Document

## 2010-06-05 LAB — DIFFERENTIAL
Eosinophils Relative: 1 % (ref 0–5)
Lymphocytes Relative: 27 % (ref 12–46)
Lymphs Abs: 1.3 10*3/uL (ref 0.7–4.0)
Monocytes Relative: 5 % (ref 3–12)
Neutrophils Relative %: 67 % (ref 43–77)

## 2010-06-05 LAB — TYPE AND SCREEN

## 2010-06-05 LAB — GLUCOSE, CAPILLARY
Glucose-Capillary: 101 mg/dL — ABNORMAL HIGH (ref 70–99)
Glucose-Capillary: 109 mg/dL — ABNORMAL HIGH (ref 70–99)
Glucose-Capillary: 127 mg/dL — ABNORMAL HIGH (ref 70–99)
Glucose-Capillary: 156 mg/dL — ABNORMAL HIGH (ref 70–99)
Glucose-Capillary: 160 mg/dL — ABNORMAL HIGH (ref 70–99)
Glucose-Capillary: 177 mg/dL — ABNORMAL HIGH (ref 70–99)
Glucose-Capillary: 190 mg/dL — ABNORMAL HIGH (ref 70–99)
Glucose-Capillary: 98 mg/dL (ref 70–99)

## 2010-06-05 LAB — URINALYSIS, ROUTINE W REFLEX MICROSCOPIC
Hgb urine dipstick: NEGATIVE
Ketones, ur: NEGATIVE mg/dL
Leukocytes, UA: NEGATIVE
Nitrite: NEGATIVE
Protein, ur: NEGATIVE mg/dL
Specific Gravity, Urine: 1.031 — ABNORMAL HIGH (ref 1.005–1.030)
Urobilinogen, UA: 0.2 mg/dL (ref 0.0–1.0)
pH: 5 (ref 5.0–8.0)

## 2010-06-05 LAB — BASIC METABOLIC PANEL
CO2: 31 mEq/L (ref 19–32)
GFR calc Af Amer: >60 mL/min (ref >60)
Potassium: 3.5 mEq/L (ref 3.5–5.1)
Sodium: 140 mEq/L (ref 135–145)

## 2010-06-05 LAB — CBC
HCT: 38.9 % (ref 36.0–46.0)
Hemoglobin: 12.9 g/dL (ref 12.0–15.0)
MCV: 93.8 fL (ref 78.0–100.0)
RBC: 4.15 MIL/uL (ref 3.87–5.11)
RDW: 14.7 % (ref 11.5–15.5)

## 2010-06-05 LAB — APTT: aPTT: 34 seconds (ref 24–37)

## 2010-06-05 LAB — PROTIME-INR
INR: 0.89 (ref 0.00–1.49)
Prothrombin Time: 12 seconds (ref 11.6–15.2)

## 2010-06-05 LAB — ABO/RH: ABO/RH(D): A POS

## 2010-06-09 LAB — BASIC METABOLIC PANEL
BUN: 14 mg/dL (ref 6–23)
BUN: 7 mg/dL (ref 6–23)
BUN: 7 mg/dL (ref 6–23)
CO2: 26 mEq/L (ref 19–32)
Chloride: 105 mEq/L (ref 96–112)
Creatinine, Ser: 0.75 mg/dL (ref 0.4–1.2)
GFR calc Af Amer: >60 mL/min (ref >60)
GFR calc Af Amer: >60 mL/min (ref >60)
GFR calc non Af Amer: >60 mL/min (ref >60)
GFR calc non Af Amer: >60 mL/min (ref >60)
Glucose, Bld: 218 mg/dL — ABNORMAL HIGH (ref 70–99)
Potassium: 3.5 mEq/L (ref 3.5–5.1)
Potassium: 3.8 mEq/L (ref 3.5–5.1)
Potassium: 4.1 mEq/L (ref 3.5–5.1)
Potassium: 4.1 mEq/L (ref 3.5–5.1)
Sodium: 138 mEq/L (ref 135–145)
Sodium: 138 mEq/L (ref 135–145)

## 2010-06-09 LAB — CBC
HCT: 25.5 % — ABNORMAL LOW (ref 36.0–46.0)
HCT: 26.1 % — ABNORMAL LOW (ref 36.0–46.0)
HCT: 31.6 % — ABNORMAL LOW (ref 36.0–46.0)
HCT: 36 % (ref 36.0–46.0)
Hemoglobin: 10.3 g/dL — ABNORMAL LOW (ref 12.0–15.0)
Hemoglobin: 8.8 g/dL — ABNORMAL LOW (ref 12.0–15.0)
MCHC: 32.5 g/dL (ref 30.0–36.0)
MCV: 92.9 fL (ref 78.0–100.0)
MCV: 93.2 fL (ref 78.0–100.0)
MCV: 94.5 fL (ref 78.0–100.0)
Platelets: 188 10*3/uL (ref 150–400)
Platelets: 201 10*3/uL (ref 150–400)
Platelets: 241 10*3/uL (ref 150–400)
RBC: 2.74 MIL/uL — ABNORMAL LOW (ref 3.87–5.11)
RDW: 14.4 % (ref 11.5–15.5)
RDW: 14.7 % (ref 11.5–15.5)
WBC: 7.6 10*3/uL (ref 4.0–10.5)
WBC: 8.1 10*3/uL (ref 4.0–10.5)

## 2010-06-09 LAB — GLUCOSE, CAPILLARY
Glucose-Capillary: 120 mg/dL — ABNORMAL HIGH (ref 70–99)
Glucose-Capillary: 123 mg/dL — ABNORMAL HIGH (ref 70–99)
Glucose-Capillary: 129 mg/dL — ABNORMAL HIGH (ref 70–99)
Glucose-Capillary: 137 mg/dL — ABNORMAL HIGH (ref 70–99)
Glucose-Capillary: 142 mg/dL — ABNORMAL HIGH (ref 70–99)
Glucose-Capillary: 144 mg/dL — ABNORMAL HIGH (ref 70–99)
Glucose-Capillary: 156 mg/dL — ABNORMAL HIGH (ref 70–99)
Glucose-Capillary: 164 mg/dL — ABNORMAL HIGH (ref 70–99)
Glucose-Capillary: 177 mg/dL — ABNORMAL HIGH (ref 70–99)
Glucose-Capillary: 222 mg/dL — ABNORMAL HIGH (ref 70–99)

## 2010-06-09 LAB — URINALYSIS, ROUTINE W REFLEX MICROSCOPIC
Bilirubin Urine: NEGATIVE
Ketones, ur: 40 mg/dL — AB
Nitrite: NEGATIVE
Protein, ur: NEGATIVE mg/dL
Specific Gravity, Urine: 1.025 (ref 1.005–1.030)
Urobilinogen, UA: 0.2 mg/dL (ref 0.0–1.0)
Urobilinogen, UA: 0.2 mg/dL (ref 0.0–1.0)
pH: 5 (ref 5.0–8.0)

## 2010-06-09 LAB — DIFFERENTIAL
Basophils Absolute: 0 10*3/uL (ref 0.0–0.1)
Eosinophils Absolute: 0.1 10*3/uL (ref 0.0–0.7)
Lymphocytes Relative: 23 % (ref 12–46)
Lymphs Abs: 1.3 10*3/uL (ref 0.7–4.0)
Neutro Abs: 3.8 10*3/uL (ref 1.7–7.7)

## 2010-06-09 LAB — HEMOGLOBIN AND HEMATOCRIT, BLOOD: HCT: 26.1 % — ABNORMAL LOW (ref 36.0–46.0)

## 2010-06-09 LAB — URINE MICROSCOPIC-ADD ON

## 2010-06-09 LAB — TYPE AND SCREEN: Antibody Screen: NEGATIVE

## 2010-06-09 LAB — ABO/RH: ABO/RH(D): A POS

## 2010-06-25 LAB — URINALYSIS, ROUTINE W REFLEX MICROSCOPIC
Ketones, ur: 15 mg/dL — AB
Nitrite: NEGATIVE
Protein, ur: NEGATIVE mg/dL
Urobilinogen, UA: 1 mg/dL (ref 0.0–1.0)
pH: 5.5 (ref 5.0–8.0)

## 2010-06-25 LAB — DIFFERENTIAL
Basophils Absolute: 0 10*3/uL (ref 0.0–0.1)
Basophils Relative: 0 % (ref 0–1)
Eosinophils Relative: 1 % (ref 0–5)
Lymphocytes Relative: 17 % (ref 12–46)

## 2010-06-25 LAB — POCT CARDIAC MARKERS
CKMB, poc: <1 ng/mL — ABNORMAL LOW (ref 1.0–8.0)
Myoglobin, poc: 115 ng/mL (ref 12–200)
Troponin i, poc: <0.05 ng/mL (ref 0.00–0.09)

## 2010-06-25 LAB — CBC
HCT: 39.1 % (ref 36.0–46.0)
Hemoglobin: 13 g/dL (ref 12.0–15.0)
MCHC: 33.3 g/dL (ref 30.0–36.0)
MCV: 92.7 fL (ref 78.0–100.0)
Platelets: 253 10*3/uL (ref 150–400)
RBC: 4.22 MIL/uL (ref 3.87–5.11)
RDW: 14.8 % (ref 11.5–15.5)
WBC: 5.2 10*3/uL (ref 4.0–10.5)

## 2010-06-25 LAB — BASIC METABOLIC PANEL
BUN: 17 mg/dL (ref 6–23)
CO2: 30 mEq/L (ref 19–32)
Calcium: 9.1 mg/dL (ref 8.4–10.5)
GFR calc non Af Amer: >60 mL/min (ref >60)
Glucose, Bld: 66 mg/dL — ABNORMAL LOW (ref 70–99)
Potassium: 4.2 mEq/L (ref 3.5–5.1)

## 2010-06-25 LAB — GLUCOSE, CAPILLARY
Glucose-Capillary: 156 mg/dL — ABNORMAL HIGH (ref 70–99)
Glucose-Capillary: 217 mg/dL — ABNORMAL HIGH (ref 70–99)

## 2010-06-25 LAB — URINE MICROSCOPIC-ADD ON

## 2010-06-25 LAB — HEMOCCULT GUIAC POC 1CARD (OFFICE): Fecal Occult Bld: NEGATIVE

## 2010-06-25 LAB — BRAIN NATRIURETIC PEPTIDE: Pro B Natriuretic peptide (BNP): 87 pg/mL (ref 0.0–100.0)

## 2010-07-24 ENCOUNTER — Encounter (INDEPENDENT_AMBULATORY_CARE_PROVIDER_SITE_OTHER): Payer: Self-pay | Admitting: General Surgery

## 2010-07-29 ENCOUNTER — Other Ambulatory Visit (HOSPITAL_BASED_OUTPATIENT_CLINIC_OR_DEPARTMENT_OTHER): Payer: Self-pay | Admitting: Internal Medicine

## 2010-07-29 DIAGNOSIS — Z9889 Other specified postprocedural states: Secondary | ICD-10-CM

## 2010-07-30 NOTE — Letter (Signed)
November 03, 2006    Dorisann Frames, M.D.  [address], [place] [address]   RE:  SCHWANDA, Jill Francis  MRN:  [id number]  /  DOB:  16-Apr-1927   Dear Dr. Talmage Nap:   Ms. India Jolin is a 77 year old lady whom I have seen for general  medical purposes.  She has insulin dependent diabetes and has previously  seen Dr. Everardo All to aid in control.  She is a delightful lady and has  requested a second opinion regarding her diabetes, and I have requested  an appointment to see you.   As noted, she has a long history of diabetes and has been on insulin  since 2007.  On reviewing her chart, it appears as though she has been  on a combination of insulin regimens, including Lantus and Humalog, and  most recently on Novolin 70/30, and is supposed to be taking 40 units  b.i.d.  The patient notes, however, that her sugars fluctuate quite  widely, and that she has had some low sugar reactions, which caused her  great concern.  Her random blood sugar yesterday showed 89 and her  hemoglobin A1c was 8.8.  We are very much appreciative of your seeing  her for diabetic treatment.   Bailie's other medical problems include a remote history of asthmatic  bronchitis and allergic rhinitis; diverticulosis and colon polyps;  history of breast cancer that is followed by Dr. Pierce Crane at the  oncology center; hypothyroidism for which she takes Synthroid 0.125 mg  p.o. daily;  degenerative arthritis and osteopenia, for which she takes Fosamax 70 mg  weekly, along with multivitamin and calcium supplementation daily.   Thank you very much for evaluation of this nice lady.  I look forward to  hearing your comments.    Sincerely,      Lonzo Cloud. Kriste Basque, MD  Electronically Signed    SMN/MedQ  DD: 11/03/2006  DT: 11/04/2006  Job #: [id number]

## 2010-08-02 NOTE — Consult Note (Signed)
[facility]                            ENDOCRINOLOGY CONSULTATION   Jill Francis, Jill Francis                      MRN:          [id number]  DATE:01/05/2006                            DOB:          [birth year]    REFERRING PHYSICIAN:  Lonzo Cloud. Kriste Basque, MD   REASON FOR REFERRAL:  Diabetes.   HISTORY OF PRESENT ILLNESS:  A 77 year old woman with a 15-year history of  diabetes.  She has no complications.  She has been on insulin for several  years.  Her diabetes is complicated by recurrent cellulitis of the left  ankle.  She also has several years of slight swelling of both ankles, but no  associated numbness.  She states she does not check her glucose at home as  often as she should.   PAST MEDICAL HISTORY:  1. Glaucoma.  2. Ovarian cancer.  3. Breast cancer.  4. Osteoporosis.  5. Hypothyroidism.  6. Anxiety.  7. Osteoarthritis.  8. Venous insufficiency.   MEDICATIONS FOR DIABETES:  1. Actos 45 mg a day.  2. Glucovance 5/500 one twice daily.  3. NPH insulin 15 units q.h.s.   SOCIAL HISTORY:  She is retired.  She has been divorced for many years.   FAMILY HISTORY:  Negative for diabetes in her immediate family.   REVIEW OF SYSTEMS:  She has lost a few pounds recently.  She denies chest  pain and hypoglycemia.   PHYSICAL EXAMINATION:  VITAL SIGNS:  Blood pressure 118/59, heart rate 64,  temperature 97.9.  The weight is 212.  GENERAL:  In no distress.  She is obese.  SKIN:  Not diaphoretic.  No rash.  HEENT:  No proptosis, no periorbital swelling.  Pharynx - no erythema.  NECK:  No goiter.  CHEST:  Clear to auscultation.  No respiratory distress.  CARDIOVASCULAR:  No JVD.  There was 1+ bilateral pretibial edema.  Regular  rate and rhythm.  No murmur.  EXTREMITIES:  Feet normal color and temperature, except for some venous  stasis discoloration.  There is no ulcer present on her feet.  NEUROLOGIC:  Alert and oriented.  Does not appear anxious  or depressed.  Sensation is decreased on the feet, worse on the left than the right.   IMPRESSION:  1. Hemoglobin A1C on October 07, 2005 is 8.8.  Despite her lack of family      history, she most likely has insulin-requiring type 2 diabetes.  2. Edema is a relative contraindication to her Actos.  3. History of cellulitis, which may be worsened by her edema.   PLAN:  1. We discussed the risk of diabetes and the importance of diet and      exercise therapy.  2. Discontinue oral agents.  3. Increase NPH insulin p.r.n. elevated glucoses.  4. Return in 7-10 days with a home glucose record.            ______________________________  Cleophas Dunker. Everardo All, MD     SAE/MedQ  DD:  01/06/2006  DT:  01/07/2006  Job #:  [id number]   cc:   Lonzo Cloud. Kriste Basque, MD

## 2010-08-02 NOTE — Op Note (Signed)
NAME:  Jill Francis, Jill Francis                         ACCOUNT NO.:  [id number]   MEDICAL RECORD NO.:  [id number]                   PATIENT TYPE:  AMB   LOCATION:  [facility]                                  FACILITY:  [facility]   PHYSICIAN:  Katy Fitch. Naaman Plummer., M.D.          DATE OF BIRTH:  [birth year]   DATE OF PROCEDURE:  11/16/2001  DATE OF DISCHARGE:                                 OPERATIVE REPORT   PREOPERATIVE DIAGNOSES:  1. Chronic retracted right massive rotator cuff tear.  2. Chronic stage III impingement, right shoulder with acromioclavicular     arthropathy and type 3 acromion with calcified coracoacromial ligament.  3. Acromioclavicular degenerative arthritis.   POSTOPERATIVE DIAGNOSES:  1. Chronic retracted right massive rotator cuff tear.  2. Chronic stage III impingement, right shoulder with acromioclavicular     arthropathy and type 3 acromion with calcified coracoacromial ligament.  3. Acromioclavicular degenerative arthritis.   OPERATION PERFORMED:  1. Reconstruction of massive right rotator cuff tear with partial resection     of greater tuberosity, generous acromioplasty and repair of     supraspinatus, infraspinatus and teres minor to greater tuberosity, right     humerus.  2. Resection of right distal clavicle.   SURGEON:  Katy Fitch. Sypher, M.D.   ASSISTANT:  Jonni Sanger, P.A.   DEGREE OF DIFFICULTY OF PROCEDURE::  Extremely difficult due to chronic  retracted necrotic rotator cuff tear.   ANESTHESIA:  General orotracheal anesthesia supplemented by interscalene  block.   SUPERVISING ANESTHESIOLOGIST:  Dr. Katrinka Blazing.   INDICATIONS FOR PROCEDURE:  The patient is a 77 year old right hand dominant  woman long term patient of our practice who has had previous left rotator  cuff reconstruction.  Recently, she developed progressive pain, loss of  range of motion in her right shoulder and inability to sleep.  She has  multiple background medical problems including  hypertension and diabetes.  She is allergic to SULFA.   After evaluation in the office she was noted to have signs of chronic  retracted rotator cuff tear.  MRI confirmed severe AC arthropathy, a type 3  acromion and a large retracted rotator cuff tear.  Due to severe and chronic  pain as well as loss of ability to elevate and use the right upper extremity  for overhead activities, she was brought to the operating room at this time  in an effort to reconstruct the rotator cuff as best as possible.  Preoperatively, she was advised that we could not guarantee a complete  repair of her cuff or recovery of full function of her shoulder; however,  our goal is to provide her pain relief, improve strength and improve range  of motion.   Preoperatively, she was advised of the potential risks and benefits of  surgery and has gone through previous left rotator cuff surgery and is quite  familiar with the rehab process.  Questions were invited and answered.  DESCRIPTION OF PROCEDURE:  The patient was brought to the operating room and  placed in supine position on the operating table.  Following anesthesia  consult by Dr. Katrinka Blazing and review of pertinent lab data an interscalene block  was placed.  She was transferred to the recovery room and placed in supine  position on the operating table.  Subsequently general orotracheal  anesthesia was induced followed by careful positioning in the beach chair  position with the aid of a torso and head holder designed for shoulder  arthroscopy.   The entire right upper extremity and forequarter were prepped with DuraPrep  and draped with impervious arthroscopy drapes.  The procedure commenced with  planning of surgical incision between the distal clavicle and the anterior  margin of the acromion.  A longitudinal incision was fashioned approximately  5 cm in length down to the level of the anterior third of the deltoid at its  insertion on the acromion.  The  cutting cautery was used to incise the  capsule of the [facility] joint and to elevate the anterior third of the deltoid off  of the acromion.  The coracoacromial ligament was very calcified and a large  type 3 anterior acromial spur was noted.  The periosteum overlying the  distal clavicle was elevated a distance of 18 mm followed by use of an  oscillating saw to remove the distal clavicle.  A very satisfactory  resection was accomplished with removal of a very large AC osteophyte.   The acromion was then leveled to a type 1 morphology with the combined use  of a power saw, power bur and osteotome.  A hand rasp was used to smooth the  lateral margin posteriorly.  Care was taken to preserve the insertion of the  posterior and lateral deltoid.  A small deltoid splitting incision was  fashioned approximately 2 cm in length affording an excellent view of the  complex rotator cuff predicament.  There was a chronic retracted necrotic  tear of the entire supraspinatus, infraspinatus and most of the teres minor.  There was a hypertrophic osteophyte of the greater tuberosity.   Osteotomes were used along with the rongeur to remove the greater tuberosity  and lower its profile approximately 4 mm.  A large cancellous bed was  created.  A rongeur was used to smooth the margins of the necrotic cuff and  a scalpel was used to create a triangular shaped defect.  This was then  gathered with mattress sutures of #2 Kevlar followed by use of a 6.5 mm  suture anchor in the cancellous to place two mattress sutures.   The supraspinatus, infraspinatus and teres minor were advanced into the  defect created in the greater tuberosity and two Kevlar sutures were placed  with McLaughlin technique, tied over the lateral cortex relatively distal on  the humerus to obtain excellent purchase.  The cuff defect was reduced by  more than 85% to a very small area of cancellous bone exposed laterally. The infraspinatus,  supraspinatus and teres minor tendons were advanced back  to the tuberosity region.  They were in direct continuity with cancellous  bone and should heal with satisfactory scar in approximately three months.   Given the complexity of the patient's tear and the poor blood supply to the  tendon, however, we will need to be very judicious in our postoperative  rehabilitation program.  The wound was thoroughly lavaged with sterile  saline followed by triple antibiotic solution.  The anterior  third of the  deltoid was repaired to the trapezium closing the dead space at the site of  distal clavicle resection.  The anterior deltoid was repaired to acromion  with mattress sutures of #2 Kevlar followed by repair of the deltoid split  with mattress sutures of the 0 Vicryl.  The wound was then repaired with  subdermal sutures of 3-0 Vicryl and intradermal 3-0 Prolene suture.   A compressive dressing was applied with Xeroflo, sterile gauze and HypaFix  followed by placement of an abduction DonJoy pillow.  There were no apparent  complications.  The patient tolerated surgery and anesthesia well.  She was  transferred to the recovery room with stable vital signs.  She will be  admitted to the recovery care center for  observation of her vitals signs and probable admission to the hospital for  approximately 48 hours postoperative observation given her multiple medical  problems and the fact that her primary caretaker has had recent cardiac  illness and in general, improved support would be beneficial for the  patient.                                                Katy Fitch Naaman Plummer., M.D.    RVS/MEDQ  D:  11/16/2001  T:  11/16/2001  Job:  [id number]   cc:   Lonzo Cloud. Kriste Basque, M.D. [facility]

## 2010-08-02 NOTE — Letter (Signed)
October 15, 2005      RE:  Jill, Francis  MRN:  [id number]  /  DOB:  1927/09/12   To Whom It May Concern,   Mrs. Jill Francis is a 77 year old lady whom I follow for general medical  purposes.  She has multiple medical problems including asthmatic bronchitis,  diabetes mellitus, hypothyroidism, and degenerative arthritis.  It has come  to my attention that Mrs. Jill Francis had signed up for a fitness contract at a  local fitness center.  She did this without consulting me prior to arranging  for the fitness program.  Due to her multiple medical problems including her  arthritis and peripheral neuropathy and recent cellulitis infection in her  leg, I feel it is not appropriate for Mrs. Jill Francis to engage in a regular  exercise program as indicated at the fitness center.   This letter is a request to cancel Mrs. Jill Francis's fitness contract due to  health reasons.  If I can be of further assistance in this matter, please  feel free to contact my office.    Sincerely,      Lonzo Cloud. Kriste Basque, MD   SMN/MedQ  DD:  10/15/2005  DT:  10/15/2005  Job #:  [id number]

## 2010-08-02 NOTE — H&P (Signed)
[facility]  Patient:    TASHAWNDA, BLEILER                      MRN: [id number] Adm. Date:  [id number] Attending:  Barton Fanny                         History and Physical  HISTORY OF PRESENT ILLNESS:  The patient is a 76 year old right-handed black female who was evaluated for lumbar stenosis.  The patient has been having difficulty with discomfort that extends to her buttock, posterior thigh, and legs with walking, typically worse with the left as compared to the right lower extremity.  She describes numbness and tingling in her feet and ankles, left worse than right.  She denies any weakness.  She denies any low back pain.  She has found the symptoms have gradually worsened over the past year and a half.  She has a significant history of diabetes, managed by her primary physician, Lonzo Cloud. Kriste Basque, M.D., a history of ovarian cancer, status post resection, followed by Samul Dada, M.D., but has currently not required radiation therapy or chemotherapy.  Nerve conduction studies of the lower extremities did not reveal a significant polyneuropathy, but MRI of the lumbar spine was obtained and revealed significant lumbar stenosis.  The patient is admitted now for a multilevel lumbar laminectomy for relief of lumbar stenosis.  PAST MEDICAL HISTORY:  Notable for history of diabetes, treated for the past 15 years.  History of ovarian cancer, status post resection in 1999.  She has not required adjuvant therapy.  There is no history of hypertension, myocardial infarction, stroke, peptic ulcer disease, or lung disease.  PAST SURGICAL HISTORY:  Tonsillectomy, hysterectomy, left rotator cuff surgery, thyroid and parathyroid surgery by Velora Heckler, M.D., oophorectomy, and umbilical herniorrhaphy.  ALLERGIES:  SULFA and IODINE.  MEDICATIONS:  Glucophage 1000 mg b.i.d., Amaryl 4 mg q.a.m., Actos 45 mg q.p.m., Humulin N 4 units q.h.s., Advair  500/50 one puff b.i.d., Singulair 10 mg p.o. q.d., Ventolin two puffs q.4h. p.r.n., Synthroid 0.125 mg q.d.  FAMILY HISTORY:  Her mother died of pancreatic cancer.  Her father died of a mesothelioma.  There is a family history of diabetes as well.  SOCIAL HISTORY:  The patient is widowed.  She is retired.  She smokes about a half-pack a day.  She has been smoking for 50 years.  She does not drink alcoholic beverages.  She denies a history of substance abuse.  REVIEW OF SYSTEMS:  Patient has had some swelling of her feet and ankles that apparently was due to Vioxx.  She has had a cataract removal, history of glaucoma, and she had a recent bout of asthmatic bronchitis that was treated by Dr. Kriste Basque and has resolved, and she has been cleared for surgery.  PHYSICAL EXAMINATION:  VITAL SIGNS:  Temperature is 97.7, pulse is 77, blood pressure 116/54, respiratory rate 18.  Height 5 feet 6 inches, weight 218 pounds.  GENERAL:  The patient is an obese black female in no acute distress.  CHEST:  Clear to auscultation.  She has symmetrical respiratory excursion.  CARDIAC:  Regular rate and rhythm, normal S1, S2.  There is no murmur.  ABDOMEN:  Soft, nondistended.  Bowel sounds are present.  EXTREMITIES:  No clubbing, cyanosis, or edema.  MUSCULOSKELETAL:  No tenderness to palpation of the lumbar spinous processes or paralumbar musculature.  She is  able to flex 90 degrees, able to extend well.  Straight leg raising is negative bilaterally.  NEUROLOGIC:  Neurologic examination shows 5/5 strength in the lower extremities, including dorsiflexor, plantar flexor, and extensor hallucis longus.  Sensation is intact to pinprick throughout the lower extremities. Reflexes are absent at the quadriceps and gastrocnemius.  They are symmetrical bilaterally.  Toes are downgoing bilaterally.  She has a normal gait and stance.  DIAGNOSTIC STUDIES:  MRI of the lumbar spine shows multifactorial  multilevel lumbar stenosis, marked to severe at L4-5, moderate at L3-4, due to a combination of degenerative disk disease and ligament and facet hypertrophy.  IMPRESSION:  Patient with neurogenic claudication, left worse than right, secondary to multilevel, multifactorial lumbar stenosis, L4-5 worse than L3-4.  PLAN:  The patient will be admitted for a multilevel L3 to L5 lumbar laminectomy.  We have discussed the nature of surgery, alternatives to the surgical procedure, typical length of surgery, hospital stay, and overall recuperation, her limitations postoperatively, and risks of surgery, including risks of infection, bleeding, possibility of transfusion, the risks of damage to nerve structures with pain, numbness, weakness, and paresthesias, the risk of dural tear and CSF leak and the possible need for further surgery, anesthetic risks of myocardial infarction, stroke, pneumonia, and death. Understanding all this, she does wish to proceed with surgery and is admitted for such.DD:  08/19/00 TD:  08/19/00 Job: [id number]

## 2010-08-02 NOTE — H&P (Signed)
NAME:  Jill Francis, Jill Francis                         ACCOUNT NO.:  [id number]   MEDICAL RECORD NO.:  [id number]                   PATIENT TYPE:  INP   LOCATION:  3011                                 FACILITY:  [facility]   PHYSICIAN:  Hewitt Shorts, M.D.            DATE OF BIRTH:  06-Dec-1927   DATE OF ADMISSION:  08/03/2003  DATE OF DISCHARGE:                                HISTORY & PHYSICAL   HISTORY OF PRESENT ILLNESS:  The patient is a 77 year old woman, a former  patient of mine, status post an L3 to S1 decompressive lumbar laminectomy in  June of 2002.  Surgery was complicated by a postoperative deep venous  thrombosis and was treated with Coumadin for 9 months.  She has more  recently developed severe, disabling, unremitting pain in the left buttock,  through the lateral left hip and thigh into the anterolateral left leg and  into the dorsum of the left foot towards the left great toe.  The patient  has undergone extensive workup including x-rays and MRI scan.  These showed  a dynamic degenerative spondylolisthesis of L4 on 5 that increases with  flexion and decreases with extension, with large L4-5 disk herniation with  fragments extending foraminally and extraforaminally to the left,  corresponding to her radicular pain.  As such, the patient is admitted now  for L4-5 diskectomy, posterior lumbar interbody fusion procedure and  posterolateral arthrodesis with interbody implants and bone graft.   PAST MEDICAL HISTORY:  Past medical history is notable for:  1. Diabetes.  2. Ovarian cancer.  3. Breast cancer.  4. Asthma.   No history of hypertension, myocardial infarction, stroke or peptic ulcer  disease.   PAST SURGICAL HISTORY:  Previous surgery includes:  1. Right breast lumpectomy a month ago.  2. L3 to S1 decompressive lumbar laminectomy 3 years ago.  3. Ovarian cancer surgery in 1998.   ALLERGIES:  She reports an allergy to SULFA.   CURRENT MEDICATIONS:  Current  medications include:  1. Actos 45 mg q.a.m.  2. Glucovance 5/500 mg b.i.d.  3. Potassium chloride 10 mEq q.a.m.  4. Humulin N 15 units nightly.  5. Fosamax 70 mg q.wk.  6. Synthroid 0.125 mg q.a.m.  7. Neurontin 600 mg t.i.d.  8. Pilocarpine 1 drop in right eye nightly.   FAMILY HISTORY:  Her parents have both passed on from cancer.  Her family  history is relative for diabetes.   SOCIAL HISTORY:  The patient is unmarried.  She is retired.  She smokes a  half a pack a day; she has been smoking for 53 years.  She does not drink  alcoholic beverages.  She denies a history of substance abuse.   REVIEW OF SYSTEMS:  Review of systems is notable for those difficulties  described in the history of present illness and past medical history but is  otherwise unremarkable.   PHYSICAL EXAMINATION:  GENERAL:  The patient is an obese black female in  obvious pain and discomfort.  VITAL SIGNS:  Temperature 98.6, pulse is 59, blood pressure 119/64,  respiratory rate 20.  Height 5 foot 5, weight 217 pounds.  LUNGS:  Lungs are clear to auscultation.  She has symmetric respiratory  excursion.  HEART:  Heart has a regular rate and rhythm with normal S1 and S2.  There is  no murmur.  ABDOMEN:  Abdomen is soft and nondistended.  Bowel sounds are present.  EXTREMITIES:  Extremity examination shows no clubbing, cyanosis or edema.  MUSCULOSKELETAL:  Examination shows no tenderness to palpation over the  lumbar spinous processes or paralumbar musculature.  She is limited in  mobility.  She is limited in flexion to about 45 degrees; she is able to  extend more easily.  Straight leg raising is negative bilaterally.  NEUROLOGIC:  Examination shows 5/5 strength to dorsiflexion, plantar flexion  and extensor hallucis longi bilaterally.  Sensation is intact to pinprick  through the distal lower extremities.  Reflexes are absent at the quadriceps  and gastrocnemii; they are symmetrical bilaterally.  Toes are  downgoing  bilaterally.  Her gait and stance favor the left lower extremity due to  pain.   IMPRESSION:  Acute left lumbar radiculopathy secondary to L4-5 disk  herniation, superimposed upon a degenerative dynamic spondylolisthesis at L4  and 5 with significant underlying degenerative disk disease and spondylosis.  Decision is made to proceed with decompression and arthrodesis.   RECOMMENDATIONS AND PLAN:  The patient will be admitted for bilateral L4-5  lumbar diskectomy, posterior lumbar interbody fusion procedure and  posterolateral arthrodesis with interbody implants and posterior  instrumentation and bone graft.  I discussed the nature of the surgery,  typical length of surgery, hospital stay and overall recuperation, the  limitations postoperatively, the need for postoperative immobilization in a  lumbar corset and risks of surgery including risks of infection and bleeding  with possible need for transfusion, the risk of nerve dysfunction with pain,  weakness, numbness or paresthesias, the risks of failure of the arthrodesis  and anesthetic risks of myocardial infarction, stroke, pneumonia and death,  also risk of deep venous thrombosis particularly with her history of DVT in  the past; understanding all of this though, she wishes to proceed with  surgery because of the severe disabling nature of her pain and she is  therefore admitted for such.                                                Hewitt Shorts, M.D.    RWN/MEDQ  D:  08/03/2003  T:  08/04/2003  Job:  [id number]

## 2010-08-02 NOTE — Op Note (Signed)
NAME:  QUINTA, EIMER                         ACCOUNT NO.:  [id number]   MEDICAL RECORD NO.:  [id number]                   PATIENT TYPE:  OIB   LOCATION:  5734                                 FACILITY:  [facility]   PHYSICIAN:  Adolph Pollack, M.D.            DATE OF BIRTH:  Aug 24, 1927   DATE OF PROCEDURE:  07/03/2003  DATE OF DISCHARGE:                                 OPERATIVE REPORT   PREOPERATIVE DIAGNOSIS:  Right breast cancer.   POSTOPERATIVE DIAGNOSIS:  Right breast cancer.   OPERATION PERFORMED:  1. Lymphazurin blue injection into the right breast.  2. Right axillary sentinel lymph node biopsy.  3. Right partial mastectomy after needle localization.   SURGEON:  Adolph Pollack, M.D.   ANESTHESIA:  General.   INDICATIONS FOR PROCEDURE:  Ms. Lober is a 77 year old female going for her  routine annual mammogram and was found to have an abnormality in the right  breast at the 2 o'clock position and an ultrasound was performed and the  mass was demonstrated.  A biopsy was performed under radiologic guidance and  was positive for invasive mammary cancer.  MRI was negative showing any  other disease.  She now presents for breast conservation therapy.  Preoperatively, she has had a successful needle localization and also an  injection of radioactive technetium sulfur colloid into the right  periareolar area.   DESCRIPTION OF PROCEDURE:  She was seen in the holding area and brought to  the operating room, placed supine on the operating table and general  anesthetic was administered.  Lymphatic mapping was performed with a  NeoProbe and a hot spot in the right axilla was marked with counts as high  as 800, background being 0.  Lymphazurin blue dye was then injected into the  four quadrants in the periareolar region after cleansing the skin with  alcohol.  The right breast and axillary regions were then sterilely prepped  and draped.  A curvilinear incision was made in the  right axillary region  and carried down to the subcutaneous tissue.  Using the NeoProbe, a hot blue  node was identified with counts as high as 3300.  Using careful blunt  dissection and cautery, this lymph node was removed and sent for Touch Prep  which was reported to be negative by Dr. Delila Spence.  There was also another  lymph nodes that was not hot but was in the region and this was sent  separately.   Local anesthetic consisting of 0.5% plain Marcaine was infiltrated to the  site.  Once hemostasis was adequate, I then closed the wound in two layers  closing subcutaneous tissue with a 3-0 Vicryl running suture and closed the  skin with a 4-0 Monocryl subcuticular stitch.  Steri-Strips were applied.   Next, the right breast was approached in the upper inner quadrant.  A radial  incision was made the upper inner quadrant 2 o'clock position  and carrying  the incision toward the nipple areolar complex.  The subcutaneous flaps were  made and the wire was passed into the wound.  Using electrocautery, partial  mastectomy of the upper inner quadrant was performed.  The specimen was  marked anteriorly with two sutures and medially with one.  It was sent for  specimen mammography and the area of concern was in the specimen.   The partial mastectomy bed was inspected and bleeding was controlled with  interrupted Vicryl sutures and with cautery.  The wound was irrigated.  Once  hemostasis was adequate, I then closed the wound in two layers closing  subcutaneous tissue loosely with interrupted 3-0 Vicryl sutures and closed  the skin with a 4-0 Monocryl subcuticular stitch.  Steri-Strips were  applied.  Sterile dressings were then applied to both wounds.   The patient tolerated the procedure well without any apparent complications.  She subsequently was extubated and taken to the recovery room in  satisfactory condition.  Plan will be to keep her overnight for 23 hour  observation.                                                Adolph Pollack, M.D.    Kari Baars  D:  07/03/2003  T:  07/04/2003  Job:  [id number]   cc:   Malachi Pro. Ambrose Mantle, M.D.  [address]  [address]  [place]  [place] [address]  Fax: [phone]   Lonzo Cloud. Kriste Basque, M.D. [facility]   [facility]

## 2010-08-02 NOTE — Discharge Summary (Signed)
NAME:  Jill Francis, Jill Francis                         ACCOUNT NO.:  [id number]   MEDICAL RECORD NO.:  [id number]                   PATIENT TYPE:  INP   LOCATION:  5018                                 FACILITY:  [facility]   PHYSICIAN:  Katy Fitch. Sypher, M.D.              DATE OF BIRTH:  [birth year]   DATE OF ADMISSION:  11/17/2001  DATE OF DISCHARGE:                                 DISCHARGE SUMMARY   ADMISSION DIAGNOSES:  1. Status post right shoulder arthroscopy with repair of large rotator cuff     tear.  2. Noninsulin-dependent diabetes mellitus.  3. Hypothyroidism.  4. Hiatal hernia..  5. Mild peripheral neuropathy.  6. History of peripheral edema.  7. Status post spinal surgery June 2002.   DISCHARGE DIAGNOSES:  1. Status post right shoulder arthroscopy with repair of large rotator cuff     tear.  2. Noninsulin-dependent diabetes mellitus.  3. Hypothyroidism.  4. Hiatal hernia..  5. Mild peripheral neuropathy.  6. History of peripheral edema.  7. Status post spinal surgery June 2002.   OPERATION PERFORMED:  Right shoulder arthroscopy with repair of large  rotator cuff tear of the right shoulder.   OPERATING SURGEON:  Katy Fitch. Sypher, M.D.   HISTORY:  The patient is a 77 year old female who was brought to the  operating room on November 16, 2001 for repair of a right rotator cuff tear  with right shoulder arthroscopy, subacromial decompression and distal  clavicle excision.  The patient had this procedure done at [facility] on an outpatient basis.  We had initially  planned to admit her for one night for postoperative pain medication and IV  antibiotics and care.  She had a lot of postoperative nausea.  She has also  recently developed problems with social issues which would not allow her to  go home as she lives alone and could not care for herself in her present  condition.  She had a very large rotator cuff tear which necessitated repair  and  the use of a 45-degree adduction pillow.  She basically has no use of  her right upper extremity at this time.  In addition to her problems with  peripheral neuropathy, she is having a great deal of problems getting  around.  She also is status post spinal surgery.  She is morbidly obese and  has problems with mobility.  It was decided initially to admit her to [facility]  for postoperative care including antiemetics, diabetic  control and initiation of physical therapy for her right shoulder.   LABORATORY DATA:  Labs on admission revealed slightly elevated glucose at  185.  The remainder of labs were within normal limits.   Chest x-ray revealed mild cardiomegaly.  There is no active lung disease.   EKG was read as normal sinus rhythm with low voltage QRS and borderline  changes.  HOSPITAL COURSE:  The patient was admitted on November 17, 2001.  She was  placed on IV PCA for pain control in addition to p.o. medications.  She  continued her adduction pillow.  CBGs were running elevated in the 200-250  range. She was having quite a bit of nausea and was not eating well.  We  continued her pain control, diabetes management and hydration.  Her second  day postoperative on November 18, 2001, CBGs were improving. Vital signs  were also improving.  Temperature was 99.5.  Nausea was better. She was  eating fairly well, complaining of moderate to severe pain.  She was voiding  without difficulty, having minimal flatus and no bowel movement.  She was  using a combination of p.o. pain medications and muscle relaxants.  On exam  her dressing was dry.  Neurovascularly she was intact and her adduction  pillow was readjusted.  At this time we contacted her case manager for  discharge disposition.  She has no family or friends to help take care of  her.  She was seen by nurse case manager and she was able to obtain a bed at  [facility] in [place].  We discussed this  with the  patient at length and she was in agreement with this transfer.   DISCHARGE MEDICATIONS:  1. Glycosamine 1500 mg p.o. daily  2. Centrum Silver p.o. daily.  3. Demadex 20 mg p.o. daily.  4. Synthroid 0.125 mg p.o. daily.  5. Actos 45 mg one p.o. daily.  6. Actonel 35 mg q. week.  7. Glucovance 500 mg p.o. b.i.d.  8. Percocet 5 mg 1-2 p.o. q.4-6 h. p.r.n. pain.  9. Robaxin 500 mg p.o. q.8h. p.r.n. muscle spasms.  10.      Motrin 600 mg one p.o. q.6 h.  11.      Enema of choice and laxative of choice.  12.      Ice to her shoulder p.r.n.   DIET:  2000 calorie ADA.   ACTIVITY:  She can be out of bed with assistance.  She needs to be seen by  physical therapy on a daily basis, working on pendulum exercises.  She will  continue with her adduction pillow full time.   WOUND CARE:  Can be changed on an as-needed basis.  We will leave her  sutures in for approximately two weeks' time at which time they will be  removed.   RETURN APPOINTMENT:  We will need to see the patient in our office as soon  as possible following her discharge from the nursing center to continue with  her wound care and physical therapy.  The patient should not be doing any  active full flexion or abduction of her shoulder until otherwise notified.   CONDITION ON DISCHARGE:  Stable.   DISCHARGE DIAGNOSIS:  Status post right shoulder arthroscopy with repair of  large rotator cuff tear.     Marveen Reeks. P.A.-C [place]                  Katy Fitch. Sypher, M.D.    RPD/MEDQ  D:  11/18/2001  T:  11/18/2001  Job:  [id number]

## 2010-08-02 NOTE — Op Note (Signed)
[place]. [facility]  Patient:    Jill Francis, Jill Francis                      MRN: [id number] Proc. Date: 08/19/00 Adm. Date:  [id number] Attending:  Barton Fanny                           Operative Report  PREOPERATIVE DIAGNOSIS:  Lumbar stenosis.  POSTOPERATIVE DIAGNOSIS:  Lumbar stenosis.  OPERATION PERFORMED:  L3 to S1 lumbar laminectomy.  SURGEON:  Hewitt Shorts, M.D.  ASSISTANT:  Danae Orleans. Venetia Maxon, M.D.  ANESTHESIA:  General endotracheal.  INDICATIONS FOR PROCEDURE:  The patient is a 77 year old woman who presented with neurogenic claudication secondary to multilevel lumbar stenosis.  The decision was made to proceed with decompressive lumbar laminectomy.  DESCRIPTION OF PROCEDURE:  The patient was brought to the operating room and placed under general endotracheal anesthesia.  The patient was turned to a prone position.  The lumbar region was prepped with DuraPrep and draped in a sterile fashion.  A localizing x-ray was taken.  The L4-5 and L3-4 levels were identified.  The midline was infiltrated with local anesthetic with epinephrine and then a midline incision was made and carried down through the subcutaneous tissues.  Bipolar cautery and electrocautery were used to maintain hemostasis.  Dissection was carried down to the lumbar fascia which was incised bilaterally.  The paraspinal muscles were dissected from the spinous process and lamina in subperiosteal fashion.  Self-retaining retractors were placed and another x-ray was taken for localization.  Then we proceeded with a multilevel lumbar laminectomy using double action rongeurs, the [facility] Max drill and Kerrison punches.  The worst stenosis was at the L4-5 level.  The next worst level at L3-4 and in fact, there was some stenosis at L5-S1 such that we had to remove the superior aspect of the S1 lamina and spinous process.  The thecal sac was well decompressed.  Care was taken  to leave the thecal sac undisturbed.  We examined the foramina and ensured that the nerve roots were decompressed at the S1 nerve root, L5 nerve root, L4 nerve root and L3 nerve roots bilaterally.  In the end after good decompression was achieved, the wound was irrigated extensively with bacitracin solution and then checked for hemostasis which was established and confirmed.  We placed Gelfoam soaked in thrombin in the laminectomy defect and then proceeded with closure.  The paraspinal muscles were approximated with interrupted undyed 1 Vicryl sutures.  The deep fascia closed with interrupted undyed 1 Vicryl sutures and the subcutaneous layer was closed with interrupted undyed 1 Vicryl sutures and the subcutaneous and subcuticular layer were closed with interrupted inverted 2-0 undyed Vicryl sutures.  Skin edges were approximated with Dermabond.  The patient tolerated the procedure well. Estimated blood loss was 100 cc.  Sponge, needle and instrument counts were correct.  Following surgery the patient is to be turned to a supine position, reversed from anesthetic, extubated and transferred to the recovery room for further care. DD:  08/19/00 TD:  08/19/00 Job: [id number]

## 2010-08-02 NOTE — Op Note (Signed)
NAME:  Jill Francis, Jill Francis                         ACCOUNT NO.:  [id number]   MEDICAL RECORD NO.:  [id number]                   PATIENT TYPE:  INP   LOCATION:  3011                                 FACILITY:  [facility]   PHYSICIAN:  Hewitt Shorts, M.D.            DATE OF BIRTH:  [birth year]   DATE OF PROCEDURE:  08/03/2003  DATE OF DISCHARGE:                                 OPERATIVE REPORT   PREOPERATIVE DIAGNOSES:  L4-5 lumbar disk herniation, L4-5 dynamic  degenerative spondylolisthesis, lumbar spondylosis, lumbar degenerative disk  disease, and lumbar radiculopathy.   POSTOPERATIVE DIAGNOSES:  L4-5 lumbar disk herniation, L4-5 dynamic  degenerative spondylolisthesis, lumbar spondylosis, lumbar degenerative disk  disease, and lumbar radiculopathy.   PROCEDURE:  Bilateral L4-5 facetectomy, microdiskectomy, and posterior  lumbar interbody fusion with Globus interbody implants and Vitoss with bone  marrow aspirate, and L4-5 posterolateral arthrodesis with 90D posterior  instrumentation and Vitoss bone marrow aspirate, and microdissection.   SURGEON:  Hewitt Shorts, M.D.   ASSISTANT:  Coletta Memos, M.D.   ANESTHESIA:  General endotracheal.   INDICATIONS:  The patient is a 77 year old woman who is three years status  post an L3 to S1 decompressive lumbar laminectomy.  She presented with an  acute left lumbar radiculopathy with severe pain.  She was found to have an  L4-5 disk herniation as well as a dynamic degenerative spondylolisthesis at  the L4-5 level, __________ for underlying spinal stenosis and degenerative  disk disease.  A decision was made to proceed with decompression and  arthrodesis.   PROCEDURE:  Patient brought to the operating room and placed under general  endotracheal anesthesia.  The patient was turned to a prone position.  The  lumbar region was prepped with Betadine soap and solution, draped in a  sterile fashion.  The __________ was infiltrated with local  anesthetic with  epinephrine.  The previous midline incision was incised and reopened, and  dissection was carried down through the subcutaneous tissue with bipolar  electrocautery and electrocautery used to maintain hemostasis.  Dissection  was carried down to the lumbar fascia, and we continued to dissect down  through the midline.  We were able to identify the superior aspect of the  remaining portion of the L3 spinous process.  We were able to dissect  subperiosteally along the lamina, and from there we were able to dissect  inferiorly, exposing the facet complexes at L3-4, L4-5, and L5-S1.  We were  then able to further dissect laterally, exposing the transverse processes of  L4 and L5 bilaterally.  The microscope was then draped and brought into the  field to provide additional magnification, illumination, and visualization,  and the reminder of the decompression was performed using microdissection  and microsurgical technique.  Careful dissection was carried out along the  edges of the scar along the previous laminectomy margin.  We then took x-  rays to confirm the localization  of the L4-5 level.  We then proceeded with  facetectomy bilaterally at L4-5, exposing the L4-5 neural foramen  bilaterally and decompressing it.  The thecal sac and exiting L4 and L5  nerve roots were identified bilaterally and we were able to identify the  annulus of the disk.  We began the diskectomy on the left side, incising the  annulus and entering into the disk space.  We then were mobilizing disk  material and a very large of disk herniation, which had extruded foraminally  and extraforaminally to the left side at L4-5 and was compressing the left  L4 nerve root, was removed with good decompression of the neural structures.  We then incised the annulus on the right side and proceeded with a  diskectomy, removing spondylitic overgrowth from the posterior aspect of the  L4 and L5 vertebral bodies  bilaterally.  It was found that the disk space  overall had less height on the right side as compared to the left side.  The  cartilaginous end plates of the vertebral bodies were removed using a  variety of curettes, and good decompression of the thecal sac and L5 and L4  nerve roots was achieved bilaterally.  In the end all loose fragments of  disk material were removed and once the end plates had been properly  prepared, we used intervertebral disk space sizers.  We selected an 11 mm  implant for the left side and a 9 mm implant for the right side.  We then  went ahead and identified the pedicle entry sites for the L5 pedicles  bilaterally.  The overlying cortex was perforated, and we were able to  carefully pass a pedicle probe into the pedicles bilaterally.  We then  aspirated bone marrow aspirate from each of the pedicles, which was injected  over a 10 mL strip of Vitoss foam.  It was saturated with the bone marrow  aspirate, and then we packed the Globus interbody cages with the Vitoss with  bone marrow aspirate and placed in a 9 mm implant on the right and an 11 mm  implant on the left.  We then took additional Vitoss with bone marrow  aspirate and packed it in the intervertebral disk space lateral to the  implanted cages.  This was tamped into place with a small tamp.  We then  draped the C-arm fluoroscopic unit.  It was brought into the field.  The  pedicles were well-probed at L5 with good trajectories.  We then identified  the pedicle entry sites at L4.  The overlying cortex was again opened with a  drill and we were able to probe with a pedicle probe into the L4 pedicle.  All four pedicles were examined with a ball probe, and no cut-outs were  found.  Good bone was found.  Each of them was tapped with a 5.25 tap, and  we placed 6.75 x 45 mm screws at L5 and 6.75 x 40 mm screws at L5.  Each  screw hole was tapped, examined with a ball probe.  No cut-outs were found, good  threading was found, and all four screws were placed.  We then measured  the space between the screws and cut a 60 mm rod in half.  It was positioned  within the screw heads and secured with locking caps, all four of which were  fully tightened.  We then decorticated the transverse process of L4 and L5  bilaterally and packed the remaining Vitoss  with bone marrow aspirate over  the transverse process and intertransverse space and then the wound was  irrigated, as it had been throughout the procedure on numerous occasions,  with bacitracin solution.  Once hemostasis was established and confirmed, we  proceeded with closure.  The paraspinal muscles were approximated with  interrupted, undyed 1 Vicryl suture, the deep fascia closed with  interrupted, undyed 1 Vicryl suture, the deep subcutaneous layer was closed  with interrupted undyed inverted 1 Vicryl sutures, and the subcutaneous and  subcuticular layer were closed with interrupted, inverted 2-0 undyed Vicryl  sutures.  The skin was reapproximated with Dermabond.  The patient tolerated  the procedure well.  The estimated blood loss was 200 mL.  We did use the  Cell Saver during this surgery, but there was insufficient blood loss to  spin down the collected blood.  Sponge and needle count were correct.  Following the surgery the patient was to be turned back to a supine  position, to be reversed from the anesthetic, extubated, and transferred to  the recovery room for further care.                                               Hewitt Shorts, M.D.    RWN/MEDQ  D:  08/03/2003  T:  08/04/2003  Job:  [id number]

## 2010-08-02 NOTE — H&P (Signed)
[place]  Patient:    Jill Francis, Jill Francis                      MRN: [id number] Adm. Date:  [id number] Attending:  Coletta Memos                         History and Physical  REASON FOR ADMISSION:  Deep venous thrombosis.  HISTORY OF ILLNESS:  Jill Francis is a 77 year old woman who underwent lumbar laminectomy by Dr. Newell Coral on August 19, 2000.  She was discharged to [facility] earlier this week and called Dr. Franky Macho yesterday with complaint of leg swelling.  She came today for vascular lab evaluation and deep venous thrombosis was identified in the left superficial femoral vein. The patient is admitted for anticoagulation.  PAST MEDICAL HISTORY: Significant for diabetes, hypothyroidism, COPD.  PHYSICAL EXAMINATION:  She is awake, alert, pleasant, cooperative, elderly white female.  She has full strength in both lower extremities and no complaint of numbness.  Her incision is well apposed and healing nicely.  She has areflexic at both knees and both ankles, great toes are downgoing to plantar stimulation.  RECOMMENDATIONS: I discussed options with the patient including risks of anticoagulation such as bleeding or paralysis versus no anticoagulation with the risk of pulmonary embolism.  I advised bed rest, heparin, followed by Coumadin for three to six months and the option of a vena cava filter was also explained to the patient. She deferred to my judgement, but I recommended not going ahead with surgical procedure with placement of a filter but to go ahead with anticoagulation for this deep venous thrombosis. DD:  08/26/00 TD:  08/26/00 Job: [id number]

## 2010-08-02 NOTE — Op Note (Signed)
NAME:  Jill Francis, Jill Francis                         ACCOUNT NO.:  [id number]   MEDICAL RECORD NO.:  [id number]                   PATIENT TYPE:  AMB   LOCATION:  DSC                                  FACILITY:  [facility]   PHYSICIAN:  Katy Fitch. Naaman Plummer., M.D.          DATE OF BIRTH:  September 10, 1927   DATE OF PROCEDURE:  09/01/2002  DATE OF DISCHARGE:                                 OPERATIVE REPORT   PREOPERATIVE DIAGNOSIS:  Chronic entrapment neuropathy, left median nerve at  carpal tunnel.   POSTOPERATIVE DIAGNOSIS:  Chronic entrapment neuropathy, left median nerve  at carpal tunnel.   OPERATION:  Release of left transverse carpal ligament.   SURGEON:  Katy Fitch. Sypher, M.D.   ASSISTANT:  Jonni Sanger, P.A.   ANESTHESIA:  General by LMA supervised by the anesthesiologist,   INDICATIONS:  Jill Francis is a 77 year old woman with a history of type 2  diabetes, hypothyroidism, and hypertension.  She is a primary care patient  of Lonzo Cloud. Kriste Basque, M.D.   She has had a history of a right rotator cuff tear that was repaired and  bilateral carpal tunnel syndrome.   Electrodiagnostic studies confirm significant carpal tunnel syndrome, worse  left than right.   After informed consent, she is brought to the operating room at this time  for release of her left transverse carpal ligament.   PROCEDURE:  Jill Francis is brought to the operating room and placed in  supine position on the operating table.  Following induction of general  anesthesia by LMA, the left arm was prepped with Betadine soap and solution  and sterilely draped.   Following exsanguination of the left limb with an Esmarch bandage, an  arterial tourniquet on the proximal brachium was inflated to 220 mmHg.   The procedure commenced with a short incision in the line of the ring finger  in the palm.  The subcutaneous tissues were carefully divided to reveal the  palmar fascia.  This was split longitudinally to reveal the  common sensory  branch of the median nerve.   These were followed back to the transverse carpal ligament, which was  carefully isolated from the median nerve.   The ligament is released along its ulnar border, extending into the distal  forearm.  This widely opened the carpal canal.  No masses or other  predicaments were noted.   Bleeding points along the margin of the released ligament were  electrocauterized with bipolar current, followed by repair of the skin with  intradermal 3-0 Prolene suture.   A compressive dressing is applied with the volar plaster splint maintaining  the wrist in 5 degrees or dorsiflexion.  Katy Fitch Naaman Plummer., M.D.    RVS/MEDQ  D:  09/01/2002  T:  09/02/2002  Job:  [id number]

## 2010-08-02 NOTE — Assessment & Plan Note (Signed)
[facility]                             PULMONARY OFFICE NOTE   Jill Francis, Jill Francis                      MRN:          [id number]  DATE:04/16/2006                            DOB:          Oct 07, 1927    HISTORY OF PRESENT ILLNESS:  The patient is a 77 year old African-  American female patient of Dr. Kriste Basque.  She has a known history of  diabetes mellitus, osteoarthritis, asthmatic bronchitis,  who presents  for an acute office visit.  The patient complains over the last 3 days  she has had some irritation along the left big toenail with some noted  drainage this morning.  The patient denies any chest pain, shortness of  breath, calf pain, swelling, or fever.  The patient has had a previous  history of cellulitis in the past.  The patient denies any recent travel  or antibiotic use.   PAST MEDICAL HISTORY:  Reviewed.   CURRENT MEDICATIONS:  Reviewed.   PHYSICAL EXAM:  The patient is an obese female in no acute distress.  She is afebrile with stable vital signs.  O2 saturation is 100% on room  air.  HEENT:  Unremarkable.  NECK:  Supple without adenopathy.  LUNGS:  Sounds are clear bilaterally.  CARDIAC:  Irregular rate.  ABDOMEN:  Morbidly obese, soft, and nontender.  EXTREMITIES:  Warm without any calf cyanosis, clubbing, or edema.  Along  the left great toe at the lateral nailbed is noted swelling and mild  erythema with some clear drainage.  Pulses are intact bilaterally.   IMPRESSION AND PLAN:  Left great toe cellulitis.  The patient is to  begin Duricef 500 mg b.i.d. x7 days.  Warm soaks to the area and keep  area clean and dry, and apply some antibiotic ointment twice daily.  The  patient will recheck here in 1 week or sooner if needed.      Rubye Oaks, NP  Electronically Signed      Lonzo Cloud. Kriste Basque, MD  Electronically Signed   TP/MedQ  DD: 04/16/2006  DT: 04/16/2006  Job #: [id number]

## 2010-08-02 NOTE — Discharge Summary (Signed)
[place]. [facility]  Patient:    Jill Francis, Jill Francis                      MRN: [id number] Adm. Date:  [id number] Disc. Date: 09/02/00 Attending:  Coletta Memos CC:         [facility]   Discharge Summary  ADMISSION HISTORY AND PHYSICAL:  The patient is a 77 year old woman who underwent a decompressive lumbar laminectomy on August 19, 2000.  She was discharged to [facility] but called because of leg swelling and vascular study showed a left superficial femoral vein, deep vein thrombosis, and she was admitted for anticoagulation.  History was notable for diabetes, hyperthyroidism and COPD.  PRIMARY PHYSICIAN:  Dr. Alroy Dust.  PHYSICAL EXAMINATION:  Revealed a well-healing incision.  HOSPITAL COURSE:  The patient was admitted by Dr. Maeola Harman.  She was started on heparin then subsequently started on Coumadin.  After the INR reached 2.0, the heparin was discontinued.  She was initially on 2.5 mg of Coumadin but that was found to be insufficient.  She was switched to 5 mg and her INR has remained 2.0 with a PT of 18.8 on the day of discharge.  She is being discharged back to [facility] with her usual medical regimen.  In addition she has been placed on Coumadin 5 mg q.4. p.m.  We prescribed 30 tablets of brand name only with no refills.  Her case has been discussed with Dr. Kriste Basque who will follow her as an outpatient and in fact we have made arrangements for the patient to see Dr. Kriste Basque on Friday, September 04, 2000.  He will have coagulation studies drawn at that time.  She is to return to see me on September 28, 2000, at 3:45 in the afternoon.  She is to let us know if she needs anything else before then.  At this point her wound is healing well and she is not really taking any medications for pain or discomfort.  She is up and ambulating actively and has in fact recovered well from her lumbar  surgery.  DISCHARGE CONDITION:  Improved.  DISCHARGE DIAGNOSES:  Left superficial femoral vein deep vein thrombosis. DD:  09/02/00 TD:  09/02/00 Job: [id number]

## 2010-08-26 ENCOUNTER — Ambulatory Visit: Payer: Self-pay

## 2010-10-08 ENCOUNTER — Ambulatory Visit
Admission: RE | Admit: 2010-10-08 | Discharge: 2010-10-08 | Disposition: A | Discharge status: Home / Self Care | Payer: Medicare Other | Source: Ambulatory Visit | Attending: Internal Medicine | Admitting: Internal Medicine

## 2010-10-08 ENCOUNTER — Encounter (INDEPENDENT_AMBULATORY_CARE_PROVIDER_SITE_OTHER): Payer: Self-pay | Admitting: General Surgery

## 2010-10-08 DIAGNOSIS — Z9889 Other specified postprocedural states: Secondary | ICD-10-CM

## 2010-11-01 ENCOUNTER — Encounter (INDEPENDENT_AMBULATORY_CARE_PROVIDER_SITE_OTHER): Payer: Self-pay | Admitting: General Surgery

## 2010-11-04 ENCOUNTER — Ambulatory Visit (INDEPENDENT_AMBULATORY_CARE_PROVIDER_SITE_OTHER): Payer: Medicare Other | Admitting: General Surgery

## 2010-11-04 ENCOUNTER — Encounter (INDEPENDENT_AMBULATORY_CARE_PROVIDER_SITE_OTHER): Payer: Self-pay | Admitting: General Surgery

## 2010-11-04 DIAGNOSIS — C50919 Malignant neoplasm of unspecified site of unspecified female breast: Secondary | ICD-10-CM

## 2010-11-04 DIAGNOSIS — C50911 Malignant neoplasm of unspecified site of right female breast: Secondary | ICD-10-CM

## 2010-11-04 NOTE — Progress Notes (Signed)
Operation:  Right partial mastectomy  Date: June 23, 2003  Stage: T1 C. N0  Hormone receptor status: Positive  HPI:  She is here for long-term followup of her right breast cancer. Her only complaint is that she's having problems with her right hand. No breast complaints. Currently she is living in a nursing home and [place] form. She is here with her cousin.  PE:  Jill Francis is in a wheelchair. She is in no acute distress.  Right breast-well-healed scars present. No masses noted. No nipple discharge.  Left breast-no palpable masses, nipple discharge, suspicious skin changes.  Nodes-no palpable cervical, supraclavicular, axillary adenopathy.  Assessment:  Right breast cancer-right mmg BI-RADS 2.  No clinical evidence of recurrence  Plan:  Return visit 12 months. Suggest hand surgery evaluation of right hand.

## 2010-12-09 LAB — CBC
HCT: 39.8
Platelets: 264
RDW: 15.3
WBC: 5.6

## 2010-12-09 LAB — URINALYSIS, ROUTINE W REFLEX MICROSCOPIC
Glucose, UA: NEGATIVE
Specific Gravity, Urine: 1.03
pH: 5.5

## 2010-12-09 LAB — URINE MICROSCOPIC-ADD ON

## 2010-12-09 LAB — COMPREHENSIVE METABOLIC PANEL
AST: 18
Albumin: 3.3 — ABNORMAL LOW
Calcium: 8.5
Creatinine, Ser: 0.63
GFR calc Af Amer: >60
Total Protein: 6.4

## 2010-12-09 LAB — DIFFERENTIAL
Eosinophils Relative: 0
Lymphocytes Relative: 18
Lymphs Abs: 1
Monocytes Absolute: 0.3
Monocytes Relative: 6

## 2010-12-09 LAB — POCT CARDIAC MARKERS
Operator id: 5451
Troponin i, poc: <0.05

## 2010-12-10 LAB — URINALYSIS, ROUTINE W REFLEX MICROSCOPIC
Bilirubin Urine: NEGATIVE
Ketones, ur: 40 — AB
Protein, ur: 100 — AB
Urobilinogen, UA: 0.2

## 2010-12-10 LAB — CBC
HCT: 39.5
Hemoglobin: 12.8
Platelets: 264
WBC: 8.1

## 2010-12-10 LAB — COMPREHENSIVE METABOLIC PANEL
Albumin: 3.4 — ABNORMAL LOW
Alkaline Phosphatase: 50
BUN: 13
Chloride: 106
Potassium: 4.3
Total Bilirubin: 0.6

## 2010-12-10 LAB — POCT CARDIAC MARKERS
CKMB, poc: 1.9
Myoglobin, poc: 118
Troponin i, poc: <0.05

## 2010-12-10 LAB — URINE MICROSCOPIC-ADD ON

## 2010-12-16 LAB — GLUCOSE, CAPILLARY
Glucose-Capillary: 105 — ABNORMAL HIGH
Glucose-Capillary: 216 — ABNORMAL HIGH

## 2010-12-16 LAB — COMPREHENSIVE METABOLIC PANEL
ALT: 9
AST: 16
Calcium: 8.6
Creatinine, Ser: 0.79
GFR calc Af Amer: >60
Glucose, Bld: 138 — ABNORMAL HIGH
Sodium: 140
Total Protein: 5.7 — ABNORMAL LOW

## 2010-12-16 LAB — DIFFERENTIAL
Eosinophils Absolute: 0
Lymphocytes Relative: 17
Lymphs Abs: 1
Monocytes Relative: 6
Neutrophils Relative %: 77

## 2010-12-16 LAB — POCT I-STAT, CHEM 8
Chloride: 103
Glucose, Bld: 133 — ABNORMAL HIGH
HCT: 40
Hemoglobin: 13.6
Potassium: 4
Sodium: 141

## 2010-12-16 LAB — URINALYSIS, ROUTINE W REFLEX MICROSCOPIC
Bilirubin Urine: NEGATIVE
Ketones, ur: NEGATIVE
Nitrite: NEGATIVE
Protein, ur: NEGATIVE
Urobilinogen, UA: 0.2

## 2010-12-16 LAB — CBC
MCHC: 32.8
MCV: 93.2
RDW: 15.2

## 2011-09-05 ENCOUNTER — Other Ambulatory Visit (HOSPITAL_COMMUNITY): Payer: Self-pay | Admitting: Obstetrics & Gynecology

## 2011-09-05 DIAGNOSIS — Z1231 Encounter for screening mammogram for malignant neoplasm of breast: Secondary | ICD-10-CM

## 2011-10-13 ENCOUNTER — Ambulatory Visit (HOSPITAL_COMMUNITY)
Admission: RE | Admit: 2011-10-13 | Discharge: 2011-10-13 | Disposition: A | Discharge status: Home / Self Care | Payer: Medicare Other | Source: Ambulatory Visit | Attending: Obstetrics & Gynecology | Admitting: Obstetrics & Gynecology

## 2011-10-13 ENCOUNTER — Ambulatory Visit (HOSPITAL_COMMUNITY): Payer: Medicare Other

## 2011-10-13 DIAGNOSIS — Z1231 Encounter for screening mammogram for malignant neoplasm of breast: Secondary | ICD-10-CM | POA: Insufficient documentation

## 2012-06-17 ENCOUNTER — Non-Acute Institutional Stay (SKILLED_NURSING_FACILITY): Payer: Medicare Other | Admitting: Internal Medicine

## 2012-06-17 DIAGNOSIS — F3289 Other specified depressive episodes: Secondary | ICD-10-CM

## 2012-06-17 DIAGNOSIS — F0390 Unspecified dementia without behavioral disturbance: Secondary | ICD-10-CM

## 2012-06-17 DIAGNOSIS — F329 Major depressive disorder, single episode, unspecified: Secondary | ICD-10-CM

## 2012-06-17 DIAGNOSIS — D509 Iron deficiency anemia, unspecified: Secondary | ICD-10-CM

## 2012-06-17 DIAGNOSIS — IMO0001 Reserved for inherently not codable concepts without codable children: Secondary | ICD-10-CM

## 2012-06-19 NOTE — Progress Notes (Signed)
PROGRESS NOTE  DATE: 06/17/12  FACILITY: [facility]  LEVEL OF CARE: SNF  Routine Visit  CHIEF COMPLAINT:  Manage diabetes mellitus and dementia  HISTORY OF PRESENT ILLNESS:  REASSESSMENT OF ONGOING PROBLEM(S):  1. DM:pt's DM remains stable.  Pt denies polyuria, polydipsia, polyphagia, changes in vision or hypoglycemic episodes.  No complications noted from the medication presently being used.  Last hemoglobin A1c is: 11.2 in 1/14.  2.DEMENTIA: The dementia remaines stable and continues to function adequately in the current living environment with supervision.  The patient has had little changes in behavior. No complications noted from the medications presently being used.  PAST MEDICAL HISTORY : Reviewed.  No changes.  CURRENT MEDICATIONS: Reviewed per [facility]  REVIEW OF SYSTEMS:  GENERAL: no change in appetite, no fatigue, no weight changes, no fever, chills or weakness RESPIRATORY: no cough, SOB, DOE, wheezing, hemoptysis CARDIAC: no chest pain, edema or palpitations GI: no abdominal pain, diarrhea, constipation, heart burn, nausea or vomiting  PHYSICAL EXAMINATION  VS:  T 97.3       P 75      RR 20      BP 112/57     POX %     WT (Lb) 216.4  GENERAL: no acute distress, normal body habitus NECK: supple, trachea midline, no neck masses, no thyroid tenderness, no thyromegaly RESPIRATORY: breathing is even & unlabored, BS CTAB CARDIAC: RRR, no murmur,no extra heart sounds, +1 bilateral edema GI: abdomen soft, normal BS, no masses, no tenderness, no hepatomegaly, no splenomegaly PSYCHIATRIC: the patient is alert & oriented to person, affect & behavior appropriate  LABS/RADIOLOGY:  2/14 hemoglobin 11.6, MCV 84.8 otherwise CBC normal, glucose 100, total protein 5.6, albumin 2.8 otherwise CMP normal  ASSESSMENT/PLAN:  1. diabetes mellitus-uncontrolled secondary to noncompliance. Lantus was increased. 2. dementia-Aricept was started. 3. iron deficiency anemia-stable. 4.  depression-stable.  CPT CODE: 91478

## 2012-07-19 ENCOUNTER — Non-Acute Institutional Stay (SKILLED_NURSING_FACILITY): Payer: Medicare HMO | Admitting: Adult Health

## 2012-07-19 ENCOUNTER — Encounter: Payer: Self-pay | Admitting: Adult Health

## 2012-07-19 DIAGNOSIS — D509 Iron deficiency anemia, unspecified: Secondary | ICD-10-CM

## 2012-07-19 DIAGNOSIS — F29 Unspecified psychosis not due to a substance or known physiological condition: Secondary | ICD-10-CM

## 2012-07-19 DIAGNOSIS — F0280 Dementia in other diseases classified elsewhere without behavioral disturbance: Secondary | ICD-10-CM

## 2012-07-19 DIAGNOSIS — IMO0001 Reserved for inherently not codable concepts without codable children: Secondary | ICD-10-CM

## 2012-07-19 NOTE — Progress Notes (Signed)
Patient ID: Jill Francis, female   DOB: [birth year], 79 y.o.   MRN: [id number]  Allergies  Allergen Reactions  . Gabapentin   . Iodine   . Sulfonamide Derivatives     Chief Complaint  Patient presents with  . Medical Managment of Chronic Issues    HPI: She is being seen for the management of chronic illnesses; she is without significant change in status she is declining her medications at times; staff is wanting to know if medications can be somewhat consolidated to help with this.    Past Medical History  Diagnosis Date  . Cancer     Skin cancer  . Diabetes mellitus   . Depression   . Asthma     Past Surgical History  Procedure Laterality Date  . Knee surgery    . Skin cancer excision    . Back surgery    . Breast surgery  right partial mastectomy  06/2003    VITAL SIGNS BP 135/52  Pulse 68  Ht 5\' 6"  (1.676 m)  Wt 219 lb (99.338 kg)  BMI 35.36 kg/m2   Patient's Medications  New Prescriptions   No medications on file  Previous Medications   CALCIUM-VITAMIN D (OSCAL WITH D) 500-200 MG-UNIT PER TABLET    Take 1 tablet by mouth 3 (three) times daily.   DONEPEZIL (ARICEPT) 10 MG TABLET    daily.   FERROUS SULFATE 325 (65 FE) MG TABLET    Take 325 mg by mouth 2 (two) times daily.   INSULIN ASPART (NOVOLOG) 100 UNIT/ML INJECTION    Inject 3 Units into the skin 3 (three) times daily with meals. Give an additional 5 units prior to meals for cbg >=200   INSULIN GLARGINE (LANTUS) 100 UNIT/ML INJECTION    Inject 15 Units into the skin daily. And 25 units nightly   RISPERIDONE (RISPERDAL) 0.5 MG TABLET    Take 0.5 mg by mouth at bedtime.  Modified Medications   No medications on file  Discontinued Medications   ACTOS 45 MG TABLET    daily.   CITALOPRAM (CELEXA) 10 MG TABLET    daily.   IPRATROPIUM-ALBUTEROL (DUONEB) 0.5-2.5 (3) MG/3ML SOLN    Ad lib.   JANUVIA 50 MG TABLET    BID times 48H.   METFORMIN (GLUCOPHAGE) 850 MG TABLET    BID times 48H.   QUETIAPINE  (SEROQUEL) 25 MG TABLET    BID times 48H.   VENTOLIN HFA 108 (90 BASE) MCG/ACT INHALER    Ad lib.    SIGNIFICANT DIAGNOSTIC EXAMS  04-12-12: wbc 7.1; hgb 12.6; hct 36.9; mcv 84.1; plt 361; glucose 164; bun 13; creat 0.89; k+ 4.3 Na++ 143; liver normal albumin 3.0; chol 173; ldl 110; trig 124; hgb a1c 11.2 05-10-12: wbc 7.6 hgb 11.6; hct 34.7; mcv 84.8; plt 369; glucose 100; bun 11; creat 0.80; k+ 3.8;  Na++ 139; liver normal albumin 2.8    Review of Systems  Respiratory: Negative for cough.   Cardiovascular: Negative for chest pain.  Gastrointestinal: Negative for abdominal pain.  Musculoskeletal: Negative for back pain.  Skin: Negative.   Neurological: Negative for headaches.  Psychiatric/Behavioral: Negative for depression.    Physical Exam  Constitutional:  overweight  Neck: Neck supple. No JVD present. No thyromegaly present.  Cardiovascular: Normal rate, regular rhythm and intact distal pulses.   Respiratory: Effort normal and breath sounds normal. No respiratory distress.  GI: Soft. Bowel sounds are normal. She exhibits no distension. There is no tenderness.  Musculoskeletal: Normal range of motion. She exhibits no edema.  Neurological: She is alert.  Skin: Skin is warm and dry.  Psychiatric: She has a normal mood and affect.      ASSESSMENT/ PLAN:  DM She is declining her insulin at night will change her lantus to 45 units in the am will change her novolog to 5 units prior to meals with an additional 5 units for cbg >= 150 will check hgb a1c   Type II or unspecified type diabetes mellitus without mention of complication, uncontrolled Will change her lantus to 45 units in the am to help with her declining her medication; will change her novolog to 5 units prior to meals with an additional 5 units for cbg >=150 will check hgb a1c   Alzheimer's disease Is without significant change in status will continue aricept 10 mg nightly   Iron deficiency anemia,  unspecified Will continue iron twice daily   Psychosis Will continue risperdal 0.5 mg nightly; will monitor her status

## 2012-08-17 ENCOUNTER — Non-Acute Institutional Stay (SKILLED_NURSING_FACILITY): Payer: Medicare HMO | Admitting: Internal Medicine

## 2012-08-17 DIAGNOSIS — F0280 Dementia in other diseases classified elsewhere without behavioral disturbance: Secondary | ICD-10-CM

## 2012-08-17 DIAGNOSIS — G309 Alzheimer's disease, unspecified: Secondary | ICD-10-CM

## 2012-08-17 DIAGNOSIS — D509 Iron deficiency anemia, unspecified: Secondary | ICD-10-CM

## 2012-08-17 DIAGNOSIS — F329 Major depressive disorder, single episode, unspecified: Secondary | ICD-10-CM

## 2012-08-19 DIAGNOSIS — F0280 Dementia in other diseases classified elsewhere without behavioral disturbance: Secondary | ICD-10-CM

## 2012-08-19 DIAGNOSIS — D509 Iron deficiency anemia, unspecified: Secondary | ICD-10-CM | POA: Insufficient documentation

## 2012-08-19 DIAGNOSIS — IMO0001 Reserved for inherently not codable concepts without codable children: Secondary | ICD-10-CM

## 2012-08-19 DIAGNOSIS — F329 Major depressive disorder, single episode, unspecified: Secondary | ICD-10-CM | POA: Insufficient documentation

## 2012-08-19 DIAGNOSIS — F0391 Unspecified dementia with behavioral disturbance: Secondary | ICD-10-CM | POA: Insufficient documentation

## 2012-08-19 HISTORY — DX: Dementia in other diseases classified elsewhere, unspecified severity, without behavioral disturbance, psychotic disturbance, mood disturbance, and anxiety: F02.80

## 2012-08-19 HISTORY — DX: Reserved for inherently not codable concepts without codable children: IMO0001

## 2012-08-19 NOTE — Progress Notes (Signed)
PROGRESS NOTE  DATE: 08/17/12  FACILITY: [facility]  LEVEL OF CARE: SNF  Routine Visit  CHIEF COMPLAINT:  Manage diabetes mellitus and dementia  HISTORY OF PRESENT ILLNESS:  REASSESSMENT OF ONGOING PROBLEM(S):  1. DM:pt's DM remains stable.  Pt denies polyuria, polydipsia, polyphagia, changes in vision or hypoglycemic episodes.  No complications noted from the medication presently being used.  Last hemoglobin A1c is: 11.2 in 1/14, in 5/14 hemoglobin A1c 8.5.  2.DEMENTIA: The dementia remaines stable and continues to function adequately in the current living environment with supervision.  The patient has had little changes in behavior. No complications noted from the medications presently being used.  PAST MEDICAL HISTORY : Reviewed.  No changes.  CURRENT MEDICATIONS: Reviewed per [facility]  REVIEW OF SYSTEMS:  GENERAL: no change in appetite, no fatigue, no weight changes, no fever, chills or weakness RESPIRATORY: no cough, SOB, DOE, wheezing, hemoptysis CARDIAC: no chest pain, edema or palpitations GI: no abdominal pain, diarrhea, constipation, heart burn, nausea or vomiting  PHYSICAL EXAMINATION  VS:  T 97.6       P 68      RR 20      BP 103/62     POX %     WT (Lb) 214.8  GENERAL: no acute distress, normal body habitus NECK: supple, trachea midline, no neck masses, no thyroid tenderness, no thyromegaly RESPIRATORY: breathing is even & unlabored, BS CTAB CARDIAC: RRR, no murmur,no extra heart sounds, +1 bilateral edema GI: abdomen soft, normal BS, no masses, no tenderness, no hepatomegaly, no splenomegaly PSYCHIATRIC: the patient is alert & oriented to person, affect & behavior appropriate  LABS/RADIOLOGY:  5/14 TSH 4.498  2/14 hemoglobin 11.6, MCV 84.8 otherwise CBC normal, glucose 100, total protein 5.6, albumin 2.8 otherwise CMP normal  ASSESSMENT/PLAN:  1. diabetes mellitus-uncontrolled secondary to noncompliance. Lantus was increased. 2. dementia-stable. 3.  iron deficiency anemia-stable. 4. depression-stable.  CPT CODE: 16109

## 2012-09-06 ENCOUNTER — Other Ambulatory Visit (HOSPITAL_BASED_OUTPATIENT_CLINIC_OR_DEPARTMENT_OTHER): Payer: Self-pay | Admitting: Internal Medicine

## 2012-09-06 DIAGNOSIS — Z1231 Encounter for screening mammogram for malignant neoplasm of breast: Secondary | ICD-10-CM

## 2012-09-20 ENCOUNTER — Non-Acute Institutional Stay (SKILLED_NURSING_FACILITY): Payer: Medicare HMO | Admitting: Adult Health

## 2012-09-20 DIAGNOSIS — F29 Unspecified psychosis not due to a substance or known physiological condition: Secondary | ICD-10-CM

## 2012-09-20 DIAGNOSIS — D509 Iron deficiency anemia, unspecified: Secondary | ICD-10-CM

## 2012-09-22 DIAGNOSIS — F29 Unspecified psychosis not due to a substance or known physiological condition: Secondary | ICD-10-CM | POA: Insufficient documentation

## 2012-09-22 NOTE — Assessment & Plan Note (Addendum)
She is declining her insulin at night will change her lantus to 45 units in the am will change her novolog to 5 units prior to meals with an additional 5 units for cbg >= 150 will check hgb a1c

## 2012-09-22 NOTE — Assessment & Plan Note (Signed)
Is without significant change in status will continue aricept 10 mg nightly

## 2012-09-22 NOTE — Assessment & Plan Note (Signed)
Will continue iron twice daily

## 2012-09-22 NOTE — Assessment & Plan Note (Signed)
Will change her lantus to 45 units in the am to help with her declining her medication; will change her novolog to 5 units prior to meals with an additional 5 units for cbg >=150 will check hgb a1c

## 2012-09-22 NOTE — Assessment & Plan Note (Signed)
Will continue risperdal 0.5 mg nightly; will monitor her status

## 2012-10-13 ENCOUNTER — Ambulatory Visit (HOSPITAL_COMMUNITY): Payer: Medicare Other

## 2012-10-20 ENCOUNTER — Ambulatory Visit (HOSPITAL_COMMUNITY): Payer: Medicare Other

## 2012-10-28 ENCOUNTER — Ambulatory Visit (HOSPITAL_COMMUNITY)
Admission: RE | Admit: 2012-10-28 | Discharge: 2012-10-28 | Disposition: A | Discharge status: Home / Self Care | Payer: Medicare PPO | Source: Ambulatory Visit | Attending: Internal Medicine | Admitting: Internal Medicine

## 2012-10-28 DIAGNOSIS — Z1231 Encounter for screening mammogram for malignant neoplasm of breast: Secondary | ICD-10-CM | POA: Insufficient documentation

## 2012-11-09 ENCOUNTER — Non-Acute Institutional Stay (SKILLED_NURSING_FACILITY): Payer: Medicare HMO | Admitting: Internal Medicine

## 2012-11-09 DIAGNOSIS — F329 Major depressive disorder, single episode, unspecified: Secondary | ICD-10-CM

## 2012-11-09 DIAGNOSIS — F0280 Dementia in other diseases classified elsewhere without behavioral disturbance: Secondary | ICD-10-CM

## 2012-11-09 DIAGNOSIS — D509 Iron deficiency anemia, unspecified: Secondary | ICD-10-CM

## 2012-11-09 NOTE — Progress Notes (Signed)
PROGRESS NOTE  DATE: 11/09/12  FACILITY: [facility]  LEVEL OF CARE: SNF  Routine Visit  CHIEF COMPLAINT:  Manage diabetes mellitus and dementia  HISTORY OF PRESENT ILLNESS:  REASSESSMENT OF ONGOING PROBLEM(S):  DM:pt's DM remains stable.  Pt denies polyuria, polydipsia, polyphagia, changes in vision or hypoglycemic episodes.  No complications noted from the medication presently being used.  Last hemoglobin A1c is: 11.2 in 1/14, in 5/14 hemoglobin A1c 8.5.  DEMENTIA: The dementia remaines stable and continues to function adequately in the current living environment with supervision.  The patient has had little changes in behavior. No complications noted from the medications presently being used.  PAST MEDICAL HISTORY : Reviewed.  No changes.  CURRENT MEDICATIONS: Reviewed per [facility]  REVIEW OF SYSTEMS:  GENERAL: no change in appetite, no fatigue, no weight changes, no fever, chills or weakness RESPIRATORY: no cough, SOB, DOE, wheezing, hemoptysis CARDIAC: no chest pain, edema or palpitations GI: no abdominal pain, diarrhea, constipation, heart burn, nausea or vomiting  PHYSICAL EXAMINATION  VS:  T 98       P 68      RR 20      BP 128/80     POX %     WT (Lb) 219.6  GENERAL: no acute distress, normal body habitus NECK: supple, trachea midline, no neck masses, no thyroid tenderness, no thyromegaly RESPIRATORY: breathing is even & unlabored, BS CTAB CARDIAC: RRR, no murmur,no extra heart sounds, +1 bilateral edema GI: abdomen soft, normal BS, no masses, no tenderness, no hepatomegaly, no splenomegaly PSYCHIATRIC: the patient is alert & oriented to person, affect & behavior appropriate  LABS/RADIOLOGY:  5/14 TSH 4.498  2/14 hemoglobin 11.6, MCV 84.8 otherwise CBC normal, glucose 100, total protein 5.6, albumin 2.8 otherwise CMP normal  ASSESSMENT/PLAN:  diabetes mellitus-uncontrolled secondary to noncompliance. Lantus was increased. dementia-stable. iron deficiency  anemia-stable. Recheck hemoglobin level depression-stable. Check CBC and CMP  CPT CODE: 16109

## 2012-11-30 ENCOUNTER — Non-Acute Institutional Stay (SKILLED_NURSING_FACILITY): Payer: Medicare HMO | Admitting: Internal Medicine

## 2012-11-30 DIAGNOSIS — D509 Iron deficiency anemia, unspecified: Secondary | ICD-10-CM

## 2012-11-30 DIAGNOSIS — F0280 Dementia in other diseases classified elsewhere without behavioral disturbance: Secondary | ICD-10-CM

## 2012-11-30 DIAGNOSIS — F3289 Other specified depressive episodes: Secondary | ICD-10-CM

## 2012-11-30 DIAGNOSIS — IMO0001 Reserved for inherently not codable concepts without codable children: Secondary | ICD-10-CM

## 2012-11-30 DIAGNOSIS — F329 Major depressive disorder, single episode, unspecified: Secondary | ICD-10-CM

## 2012-12-01 NOTE — Progress Notes (Signed)
PROGRESS NOTE  DATE: 11/30/12  FACILITY: [facility]  LEVEL OF CARE: SNF  Routine Visit  CHIEF COMPLAINT:  Manage diabetes mellitus and dementia  HISTORY OF PRESENT ILLNESS:  REASSESSMENT OF ONGOING PROBLEM(S):  DM:pt's DM is unstable.  Pt denies polyuria, polydipsia, polyphagia, changes in vision or hypoglycemic episodes.  No complications noted from the medication presently being used.  Last hemoglobin A1c is: 11.2 in 1/14, in 5/14 hemoglobin A1c 8.5.  DEMENTIA: The dementia remaines stable and continues to function adequately in the current living environment with supervision.  The patient has had little changes in behavior. No complications noted from the medications presently being used.  PAST MEDICAL HISTORY : Reviewed.  No changes.  CURRENT MEDICATIONS: Reviewed per [facility]  REVIEW OF SYSTEMS:  GENERAL: no change in appetite, no fatigue, no weight changes, no fever, chills or weakness RESPIRATORY: no cough, SOB, DOE, wheezing, hemoptysis CARDIAC: no chest pain, edema or palpitations GI: no abdominal pain, diarrhea, constipation, heart burn, nausea or vomiting  PHYSICAL EXAMINATION  VS:  T 97.1       P 68      RR 20      BP 125/83     POX %     WT (Lb) 216.6  GENERAL: no acute distress, obese body habitus NECK: supple, trachea midline, no neck masses, no thyroid tenderness, no thyromegaly RESPIRATORY: breathing is even & unlabored, BS CTAB CARDIAC: RRR, no murmur,no extra heart sounds, +1 bilateral edema GI: abdomen soft, normal BS, no masses, no tenderness, no hepatomegaly, no splenomegaly PSYCHIATRIC: the patient is alert & oriented to person, affect & behavior appropriate  LABS/RADIOLOGY:  5/14 TSH 4.498  2/14 hemoglobin 11.6, MCV 84.8 otherwise CBC normal, glucose 100, total protein 5.6, albumin 2.8 otherwise CMP normal  ASSESSMENT/PLAN:  diabetes mellitus-uncontrolled secondary to noncompliance. Lantus was increased. Check hemoglobin  A1c dementia-stable. iron deficiency anemia-stable. Recheck hemoglobin level depression-stable. Check CBC and CMP-staff reports that patient is very noncompliant with lab draws.  CPT CODE: 41324

## 2012-12-07 ENCOUNTER — Non-Acute Institutional Stay (SKILLED_NURSING_FACILITY): Payer: Medicare HMO | Admitting: Internal Medicine

## 2012-12-28 ENCOUNTER — Non-Acute Institutional Stay (SKILLED_NURSING_FACILITY): Payer: Medicare HMO | Admitting: Internal Medicine

## 2012-12-28 DIAGNOSIS — F329 Major depressive disorder, single episode, unspecified: Secondary | ICD-10-CM

## 2012-12-28 DIAGNOSIS — D509 Iron deficiency anemia, unspecified: Secondary | ICD-10-CM

## 2012-12-28 DIAGNOSIS — F0280 Dementia in other diseases classified elsewhere without behavioral disturbance: Secondary | ICD-10-CM

## 2013-01-01 NOTE — Progress Notes (Signed)
PROGRESS NOTE  DATE: 12/28/12  FACILITY: [facility]  LEVEL OF CARE: SNF  Routine Visit  CHIEF COMPLAINT:  Manage diabetes mellitus and dementia  HISTORY OF PRESENT ILLNESS:  REASSESSMENT OF ONGOING PROBLEM(S):  DM:pt's DM is unstable.  Pt denies polyuria, polydipsia, polyphagia, changes in vision or hypoglycemic episodes.  No complications noted from the medication presently being used.  Last hemoglobin A1c is: 11.2 in 1/14, in 5/14 hemoglobin A1c 8.5, in 9/14 HbA1c 8.6.  DEMENTIA: The dementia remaines stable and continues to function adequately in the current living environment with supervision.  The patient has had little changes in behavior. No complications noted from the medications presently being used.  PAST MEDICAL HISTORY : Reviewed.  No changes.  CURRENT MEDICATIONS: Reviewed per [facility]  REVIEW OF SYSTEMS:  GENERAL: no change in appetite, no fatigue, no weight changes, no fever, chills or weakness RESPIRATORY: no cough, SOB, DOE, wheezing, hemoptysis CARDIAC: no chest pain, edema or palpitations GI: no abdominal pain, diarrhea, constipation, heart burn, nausea or vomiting  PHYSICAL EXAMINATION  VS:  T 96.5      P 64     RR 18      BP 135/68    POX %     WT (Lb) 224  GENERAL: no acute distress, obese body habitus NECK: supple, trachea midline, no neck masses, no thyroid tenderness, no thyromegaly RESPIRATORY: breathing is even & unlabored, BS CTAB CARDIAC: RRR, no murmur,no extra heart sounds, +1 bilateral edema GI: abdomen soft, normal BS, no masses, no tenderness, no hepatomegaly, no splenomegaly PSYCHIATRIC: the patient is alert & oriented to person, affect & behavior appropriate  LABS/RADIOLOGY:  9-14 cbc nl, glc 312, alb 3 ow cmp nl  5/14 TSH 4.498  2/14 hemoglobin 11.6, MCV 84.8 otherwise CBC normal, glucose 100, total protein 5.6, albumin 2.8 otherwise CMP normal  ASSESSMENT/PLAN:  diabetes mellitus-uncontrolled secondary to noncompliance.  Lantus was increased. dementia-stable. iron deficiency anemia-Hb nl. depression-stable.  CPT CODE: 82956

## 2013-01-06 NOTE — Progress Notes (Signed)
Patient ID: Jill Francis, female   DOB: October 25, 1927, 79 y.o.   MRN: [id number]        PROGRESS NOTE  DATE: 12/07/2012  FACILITY:  [facility]  LEVEL OF CARE: SNF (31)  Acute Visit  CHIEF COMPLAINT:  Manage diabetes mellitus    HISTORY OF PRESENT ILLNESS: I was requested by the staff to assess the patient regarding above problem(s):  DM:pt's DM is unstable.  Pt denies polyuria, polydipsia, polyphagia, changes in vision or hypoglycemic episodes.  No complications noted from the medication presently being used.  Last hemoglobin A1c is: 10.6 on 12/03/2012.    PAST MEDICAL HISTORY : Reviewed.  No changes.  CURRENT MEDICATIONS: Reviewed per [facility]  REVIEW OF SYSTEMS:  GENERAL: no change in appetite, no fatigue, no weight changes, no fever, chills or weakness RESPIRATORY: no cough, SOB, DOE,, wheezing, hemoptysis CARDIAC: no chest pain, edema or palpitations GI: no abdominal pain, diarrhea, constipation, heart burn, nausea or vomiting  PHYSICAL EXAMINATION  GENERAL: no acute distress, moderately obese body habitus NECK: supple, trachea midline, no neck masses, no thyroid tenderness, no thyromegaly RESPIRATORY: breathing is even & unlabored, BS CTAB CARDIAC: RRR, no murmur,no extra heart sounds EDEMA/VARICOSITIES:  +1 bilateral lower extremity edema  ARTERIAL:  pedal pulses diminished   GI: abdomen soft, normal BS, no masses, no tenderness, no hepatomegaly, no splenomegaly PSYCHIATRIC: the patient is alert & oriented to person, affect & behavior appropriate  ASSESSMENT/PLAN:  Diabetes mellitus.  Uncontrolled.  The patient is noncompliant.  Increase Lantus to 48 U q.a.m.    CPT CODE: 40981

## 2013-01-16 ENCOUNTER — Encounter (HOSPITAL_COMMUNITY): Payer: Self-pay | Admitting: Emergency Medicine

## 2013-01-16 ENCOUNTER — Observation Stay (HOSPITAL_COMMUNITY)
Admission: EM | Admit: 2013-01-16 | Discharge: 2013-01-18 | Disposition: A | Discharge status: Skilled Nursing Facility | Payer: Medicare PPO | Attending: Internal Medicine | Admitting: Internal Medicine

## 2013-01-16 ENCOUNTER — Emergency Department (HOSPITAL_COMMUNITY): Payer: Medicare PPO

## 2013-01-16 DIAGNOSIS — D126 Benign neoplasm of colon, unspecified: Secondary | ICD-10-CM

## 2013-01-16 DIAGNOSIS — F0390 Unspecified dementia without behavioral disturbance: Secondary | ICD-10-CM

## 2013-01-16 DIAGNOSIS — F0280 Dementia in other diseases classified elsewhere without behavioral disturbance: Secondary | ICD-10-CM

## 2013-01-16 DIAGNOSIS — Z79899 Other long term (current) drug therapy: Secondary | ICD-10-CM | POA: Insufficient documentation

## 2013-01-16 DIAGNOSIS — Z794 Long term (current) use of insulin: Secondary | ICD-10-CM | POA: Insufficient documentation

## 2013-01-16 DIAGNOSIS — F329 Major depressive disorder, single episode, unspecified: Secondary | ICD-10-CM | POA: Insufficient documentation

## 2013-01-16 DIAGNOSIS — C569 Malignant neoplasm of unspecified ovary: Secondary | ICD-10-CM

## 2013-01-16 DIAGNOSIS — Z23 Encounter for immunization: Secondary | ICD-10-CM | POA: Insufficient documentation

## 2013-01-16 DIAGNOSIS — IMO0001 Reserved for inherently not codable concepts without codable children: Secondary | ICD-10-CM

## 2013-01-16 DIAGNOSIS — I10 Essential (primary) hypertension: Secondary | ICD-10-CM | POA: Insufficient documentation

## 2013-01-16 DIAGNOSIS — C50919 Malignant neoplasm of unspecified site of unspecified female breast: Secondary | ICD-10-CM

## 2013-01-16 DIAGNOSIS — D509 Iron deficiency anemia, unspecified: Secondary | ICD-10-CM

## 2013-01-16 DIAGNOSIS — G309 Alzheimer's disease, unspecified: Secondary | ICD-10-CM | POA: Insufficient documentation

## 2013-01-16 DIAGNOSIS — E1169 Type 2 diabetes mellitus with other specified complication: Principal | ICD-10-CM | POA: Insufficient documentation

## 2013-01-16 DIAGNOSIS — F411 Generalized anxiety disorder: Secondary | ICD-10-CM

## 2013-01-16 DIAGNOSIS — F3289 Other specified depressive episodes: Secondary | ICD-10-CM

## 2013-01-16 DIAGNOSIS — E162 Hypoglycemia, unspecified: Secondary | ICD-10-CM

## 2013-01-16 DIAGNOSIS — F0391 Unspecified dementia with behavioral disturbance: Secondary | ICD-10-CM | POA: Diagnosis present

## 2013-01-16 LAB — COMPREHENSIVE METABOLIC PANEL
ALT: 6 U/L (ref 0–35)
AST: 14 U/L (ref 0–37)
Albumin: 2.7 g/dL — ABNORMAL LOW (ref 3.5–5.2)
Alkaline Phosphatase: 78 U/L (ref 39–117)
CO2: 32 mEq/L (ref 19–32)
Calcium: 7.7 mg/dL — ABNORMAL LOW (ref 8.4–10.5)
GFR calc non Af Amer: 63 mL/min — ABNORMAL LOW (ref >90)
Glucose, Bld: 103 mg/dL — ABNORMAL HIGH (ref 70–99)
Potassium: 4 mEq/L (ref 3.5–5.1)
Sodium: 138 mEq/L (ref 135–145)
Total Protein: 6.4 g/dL (ref 6.0–8.3)

## 2013-01-16 LAB — URINALYSIS, ROUTINE W REFLEX MICROSCOPIC
Bilirubin Urine: NEGATIVE
Glucose, UA: >1000 mg/dL — AB
Hgb urine dipstick: NEGATIVE
Ketones, ur: NEGATIVE mg/dL
Leukocytes, UA: NEGATIVE
Nitrite: NEGATIVE
Protein, ur: NEGATIVE mg/dL
Specific Gravity, Urine: 1.025 (ref 1.005–1.030)
Urobilinogen, UA: 0.2 mg/dL (ref 0.0–1.0)
pH: 7 (ref 5.0–8.0)

## 2013-01-16 LAB — CBC WITH DIFFERENTIAL/PLATELET
Eosinophils Absolute: 0.1 10*3/uL (ref 0.0–0.7)
Eosinophils Relative: 0 % (ref 0–5)
HCT: 38.7 % (ref 36.0–46.0)
Lymphocytes Relative: 17 % (ref 12–46)
Lymphs Abs: 1.9 10*3/uL (ref 0.7–4.0)
MCH: 29 pg (ref 26.0–34.0)
MCV: 87.8 fL (ref 78.0–100.0)
Monocytes Absolute: 0.5 10*3/uL (ref 0.1–1.0)
RBC: 4.41 MIL/uL (ref 3.87–5.11)
RDW: 15.1 % (ref 11.5–15.5)
WBC: 11.2 10*3/uL — ABNORMAL HIGH (ref 4.0–10.5)

## 2013-01-16 LAB — GLUCOSE, CAPILLARY
Glucose-Capillary: 222 mg/dL — ABNORMAL HIGH (ref 70–99)
Glucose-Capillary: 93 mg/dL (ref 70–99)
Glucose-Capillary: 52 mg/dL — ABNORMAL LOW (ref 70–99)

## 2013-01-16 LAB — BASIC METABOLIC PANEL
BUN: 18 mg/dL (ref 6–23)
CO2: 27 mEq/L (ref 19–32)
Calcium: 7.6 mg/dL — ABNORMAL LOW (ref 8.4–10.5)
Chloride: 101 mEq/L (ref 96–112)
Creatinine, Ser: 0.82 mg/dL (ref 0.50–1.10)
GFR calc Af Amer: 75 mL/min — ABNORMAL LOW (ref >90)
GFR calc non Af Amer: 64 mL/min — ABNORMAL LOW (ref >90)
Glucose, Bld: 264 mg/dL — ABNORMAL HIGH (ref 70–99)
Potassium: 3.5 mEq/L (ref 3.5–5.1)
Sodium: 135 mEq/L (ref 135–145)

## 2013-01-16 LAB — CBC
HCT: 39 % (ref 36.0–46.0)
Hemoglobin: 12.9 g/dL (ref 12.0–15.0)
MCH: 29.1 pg (ref 26.0–34.0)
MCHC: 33.1 g/dL (ref 30.0–36.0)
MCV: 88 fL (ref 78.0–100.0)
Platelets: 303 10*3/uL (ref 150–400)
RBC: 4.43 MIL/uL (ref 3.87–5.11)
RDW: 15.1 % (ref 11.5–15.5)
WBC: 11.7 10*3/uL — ABNORMAL HIGH (ref 4.0–10.5)

## 2013-01-16 LAB — APTT: aPTT: 32 seconds (ref 24–37)

## 2013-01-16 LAB — PROTIME-INR: INR: 0.98 (ref 0.00–1.49)

## 2013-01-16 LAB — TSH: TSH: 5.253 u[IU]/mL — ABNORMAL HIGH (ref 0.350–4.500)

## 2013-01-16 LAB — URINE MICROSCOPIC-ADD ON

## 2013-01-16 LAB — PHOSPHORUS: Phosphorus: 3.5 mg/dL (ref 2.3–4.6)

## 2013-01-16 MED ORDER — RESOURCE INSTANT PROTEIN PO PWD PACKET
1.0000 | Freq: Two times a day (BID) | ORAL | Status: DC
  Administered 2013-01-16 – 2013-01-17 (×4): 6 g via ORAL
  Filled 2013-01-16 (×7): qty 6

## 2013-01-16 MED ORDER — SODIUM CHLORIDE 0.9 % IJ SOLN
3.0000 mL | Freq: Two times a day (BID) | INTRAMUSCULAR | Status: DC
  Administered 2013-01-16 (×2): 3 mL via INTRAVENOUS

## 2013-01-16 MED ORDER — RISPERIDONE 0.5 MG PO TABS
0.5000 mg | ORAL_TABLET | Freq: Every day | ORAL | Status: DC
  Administered 2013-01-16 – 2013-01-17 (×2): 0.5 mg via ORAL
  Filled 2013-01-16 (×3): qty 1

## 2013-01-16 MED ORDER — MEMANTINE HCL 10 MG PO TABS
10.0000 mg | ORAL_TABLET | Freq: Two times a day (BID) | ORAL | Status: DC
  Administered 2013-01-16 – 2013-01-17 (×4): 10 mg via ORAL
  Filled 2013-01-16 (×6): qty 1

## 2013-01-16 MED ORDER — FERROUS SULFATE 325 (65 FE) MG PO TABS
325.0000 mg | ORAL_TABLET | Freq: Two times a day (BID) | ORAL | Status: DC
  Administered 2013-01-16 – 2013-01-17 (×4): 325 mg via ORAL
  Filled 2013-01-16 (×6): qty 1

## 2013-01-16 MED ORDER — ONDANSETRON HCL 4 MG PO TABS: 4.0000 mg | ORAL_TABLET | Freq: Four times a day (QID) | ORAL | Status: DC | PRN

## 2013-01-16 MED ORDER — ACETAMINOPHEN 650 MG RE SUPP: 650.0000 mg | Freq: Four times a day (QID) | RECTAL | Status: DC | PRN

## 2013-01-16 MED ORDER — PNEUMOCOCCAL VAC POLYVALENT 25 MCG/0.5ML IJ INJ
0.5000 mL | INJECTION | INTRAMUSCULAR | Status: AC
  Administered 2013-01-17: 11:00:00 0.5 mL via INTRAMUSCULAR
  Filled 2013-01-16 (×2): qty 0.5

## 2013-01-16 MED ORDER — SODIUM CHLORIDE 0.9 % IV SOLN
INTRAVENOUS | Status: DC
  Administered 2013-01-16: 12:00:00 via INTRAVENOUS

## 2013-01-16 MED ORDER — ONDANSETRON HCL 4 MG/2ML IJ SOLN: 4.0000 mg | Freq: Four times a day (QID) | INTRAMUSCULAR | Status: DC | PRN

## 2013-01-16 MED ORDER — CALCIUM CARBONATE-VITAMIN D 500-200 MG-UNIT PO TABS
1.0000 | ORAL_TABLET | Freq: Three times a day (TID) | ORAL | Status: DC
  Administered 2013-01-16 – 2013-01-17 (×6): 1 via ORAL
  Filled 2013-01-16 (×9): qty 1

## 2013-01-16 MED ORDER — ACETAMINOPHEN 325 MG PO TABS: 650.0000 mg | ORAL_TABLET | Freq: Four times a day (QID) | ORAL | Status: DC | PRN

## 2013-01-16 MED ORDER — DONEPEZIL HCL 10 MG PO TABS
10.0000 mg | ORAL_TABLET | Freq: Every day | ORAL | Status: DC
  Administered 2013-01-16 – 2013-01-17 (×2): 10 mg via ORAL
  Filled 2013-01-16 (×3): qty 1

## 2013-01-16 NOTE — Progress Notes (Signed)
Pt ate less than half of lunch today, but drank protein powder in ensure.  Blood sugar was 55 before supper,pt  asymptomatic.  Had OJ and ate 75% of supper, recheck was 105.

## 2013-01-16 NOTE — ED Provider Notes (Signed)
CSN: [id number]     Arrival date & time 01/16/13  4098 History   First MD Initiated Contact with Patient 01/16/13 0645     Chief Complaint  Patient presents with  . Hypoglycemia   (Consider location/radiation/quality/duration/timing/severity/associated sxs/prior Treatment) Patient is a 79 y.o. female presenting with hypoglycemia. The history is provided by the nursing home and the EMS personnel.  Hypoglycemia Pt is an 79yo female BIB EMS from [facility] for recurrent hypoglycemia. Hx limited to staff and EMS as pt is demented but alert-pt's baseline per staff.  Staff report CBG of 45 last night, was given glucagon at 2100 and again this morning prior to EMS arrival.  EMS administered 1 amp of D50 which brought pt's CBG to 235 which is close to pt's baseline.  Pt denies pain or nausea.  No reports of fever.  No other reported symptoms.   Past Medical History  Diagnosis Date  . Cancer     Skin cancer  . Diabetes mellitus   . Depression   . Asthma    Past Surgical History  Procedure Laterality Date  . Knee surgery    . Skin cancer excision    . Back surgery    . Breast surgery  right partial mastectomy  06/2003   No family history on file. History  Substance Use Topics  . Smoking status: Former Games developer  . Smokeless tobacco: Not on file  . Alcohol Use: No   OB History   Grav Para Term Preterm Abortions TAB SAB Ect Mult Living                 Review of Systems  Unable to perform ROS: Dementia    Allergies  Gabapentin; Iodine; and Sulfonamide derivatives  Home Medications   Current Outpatient Rx  Name  Route  Sig  Dispense  Refill  . calcium-vitamin D (OSCAL WITH D) 500-200 MG-UNIT per tablet   Oral   Take 1 tablet by mouth 3 (three) times daily.         Marland Kitchen donepezil (ARICEPT) 10 MG tablet   Oral   Take 10 mg by mouth at bedtime.          . ferrous sulfate 325 (65 FE) MG tablet   Oral   Take 325 mg by mouth 2 (two) times daily.         . insulin aspart  (NOVOLOG) 100 UNIT/ML injection   Subcutaneous   Inject 3 Units into the skin 3 (three) times daily with meals. Give an additional 5 units prior to meals for cbg >=200         . insulin glargine (LANTUS) 100 UNIT/ML injection   Subcutaneous   Inject 48 Units into the skin daily.          . memantine (NAMENDA) 10 MG tablet   Oral   Take 10 mg by mouth 2 (two) times daily.         . protein supplement (RESOURCE BENEPROTEIN) POWD   Oral   Take 1 scoop by mouth 2 (two) times daily.         . risperiDONE (RISPERDAL) 0.5 MG tablet   Oral   Take 0.5 mg by mouth at bedtime.          BP 142/62  Pulse 55  Temp(Src) 97.7 F (36.5 C) (Oral)  Resp 19  SpO2 100% Physical Exam  Nursing note and vitals reviewed. Constitutional: She appears well-developed and well-nourished. No distress.  Pt lying comfortably  in exam bed. Nasal canula in place. NAD.  HENT:  Head: Normocephalic and atraumatic.  Eyes: Conjunctivae are normal. No scleral icterus.  Neck: Normal range of motion.  Cardiovascular: Normal rate, regular rhythm and normal heart sounds.   Pulmonary/Chest: Effort normal. No respiratory distress. She has wheezes. She has rales. She exhibits no tenderness.  Course breath sounds throughout  Abdominal: Soft. Bowel sounds are normal. She exhibits no distension and no mass. There is no tenderness. There is no rebound and no guarding.  Musculoskeletal: Normal range of motion.  Neurological: She is alert.  Pt alert, nods head for 'yes' and 'no' questions.  Skin: Skin is warm and dry. She is not diaphoretic.    ED Course  Procedures (including critical care time) Labs Review Labs Reviewed  GLUCOSE, CAPILLARY - Abnormal; Notable for the following:    Glucose-Capillary 222 (*)    All other components within normal limits  CBC - Abnormal; Notable for the following:    WBC 11.7 (*)    All other components within normal limits  BASIC METABOLIC PANEL - Abnormal; Notable for the  following:    Glucose, Bld 264 (*)    Calcium 7.6 (*)    GFR calc non Af Amer 64 (*)    GFR calc Af Amer 75 (*)    All other components within normal limits  URINALYSIS, ROUTINE W REFLEX MICROSCOPIC - Abnormal; Notable for the following:    Glucose, UA >1000 (*)    All other components within normal limits  GLUCOSE, CAPILLARY - Abnormal; Notable for the following:    Glucose-Capillary 211 (*)    All other components within normal limits  URINE MICROSCOPIC-ADD ON   Imaging Review Dg Chest Portable 1 View  01/16/2013   CLINICAL DATA:  Shortness of breath.  EXAM: PORTABLE CHEST - 1 VIEW  COMPARISON:  08/09/2008  FINDINGS: Portable view of the chest demonstrate slightly prominent interstitial lung markings. Heart size is upper limits of normal. Postsurgical changes in the lower neck. No focal airspace disease.  IMPRESSION: Slightly prominent lung markings may be associated with mild edema or decreased lung volumes.  Heart appears to be enlarged but may be accentuated by the technique and low lung volumes.   Electronically Signed   By: Richarda Overlie M.D.   On: 01/16/2013 07:28    EKG Interpretation   None       MDM   1. Hypoglycemia   2. Dementia    Pt brought in by EMS for recurrent hypoglycemia that was not responding to glucagon at [facility].  CBG increased to 235 after D50 was administered by EMS.  On PE-pt is alert, nods her head to 'yes/no' questions. Denies pain or nausea.  NAD. Lungs-course lung sounds.  Will likely need to admit pt for recurrent hypoglycemia.  Will get basic labs: CBC and BMP.  Will get CXR due to course breath sounds.    CBG upon arrival (06:42): 222  CBC- unremarkable BMP: unremarkable CXR: mild edema vs decreased lung volumes  CBG (08:42): 211  UA: unremarkable.  Will consult hospitalist to come see pt to determine possible admittance for observation vs discharge home.   Consulted with Dr. Elisabeth Pigeon, hospitalist, will admit pt to observation tele for  hypoglycemia.     Junius Finner, PA-C 01/16/13 1010

## 2013-01-16 NOTE — ED Notes (Signed)
Report just phoned to [place], RN on 4th floor.  I will transport her their shortly with ecg monitoring.

## 2013-01-16 NOTE — Progress Notes (Signed)
Hypoglycemic Event  CBG: 55   Treatment: 15 GM carbohydrate snack  Symptoms: None  Follow-up CBG: Time:1715 CBG Result:105  Possible Reasons for Event: Inadequate meal intake  Comments/MD notified:Pt ate very little at lunch, drank protein powder in an ensure    Jill Francis  Remember to initiate Hypoglycemia Order Set & complete

## 2013-01-16 NOTE — ED Notes (Signed)
Pt. Remains in no distress.  She is quietly visiting with a female relative.  Our tech.Raynelle Fanning performed I & O cath. With no urine obtained. It was her observation that her brief was wet and that she had probably had just voided--will attempt again presently.

## 2013-01-16 NOTE — H&P (Signed)
[facility] History and Physical  Jill Francis:[id number] DOB: [birth year] DOA: 01/16/2013  Referring physician: ER physician PCP: Terald Sleeper, MD   Chief Complaint: hypoglycemia  HPI:  79 year old female with past medical history of dementia, hypertension, diabetes who presented to [facility] ED from NH after she was found to have low blood sugars around 40's. Patient was given 2 rounds of glucagon in SNF and CBG improved to 80's. Patient is not good historian due to dementia. Once EMS arrived, pt received 1 amp D50 by EMS and her CBG improved to about 200 range In ED, BP was 142/62 and then 119/47. HR was 52-54, T 97.F. Oxygen saturation was 100% on room air. Glucose was 264. CXR did not reveal acute cardiopulmonary process.  Assessment and Plan:  Principal Problem:   Hypoglycemia - hold insulin regimen (home dosages) - appreciate diabetic coordinator consult  Active Problems:   Alzheimer's disease - continue namenda and aricept   Iron deficiency anemia, unspecified - continue ferrous sulfate supplementation   Depressive disorder, not elsewhere classified - continue risperidone  Radiological Exams on Admission: Dg Chest Portable 1 View 01/16/2013  IMPRESSION: Slightly prominent lung markings may be associated with mild edema or decreased lung volumes.  Heart appears to be enlarged but may be accentuated by the technique and low lung volumes.     Code Status: Full Family Communication: Pt at bedside Disposition Plan: Admit for further evaluation  Manson Passey, MD  [facility] Pager [phone]  Review of Systems:  Unable to obtain due to dementia    Past Medical History  Diagnosis Date  . Cancer     Skin cancer  . Diabetes mellitus   . Depression   . Asthma    Past Surgical History  Procedure Laterality Date  . Knee surgery    . Skin cancer excision    . Back surgery    . Breast surgery  right partial mastectomy  06/2003   Social History:   reports that she has quit smoking. She does not have any smokeless tobacco history on file. She reports that she does not drink alcohol or use illicit drugs.  Allergies  Allergen Reactions  . Gabapentin     Per mar  . Iodine     Per mar  . Sulfonamide Derivatives     Per mar    Family History: unable to obtain due to dementia  Prior to Admission medications   Medication Sig Start Date End Date Taking? Authorizing Provider  calcium-vitamin D (OSCAL WITH D) 500-200 MG-UNIT per tablet Take 1 tablet by mouth 3 (three) times daily.   Yes Historical Provider, MD  donepezil (ARICEPT) 10 MG tablet Take 10 mg by mouth at bedtime.  10/28/10  Yes Historical Provider, MD  ferrous sulfate 325 (65 FE) MG tablet Take 325 mg by mouth 2 (two) times daily.   Yes Historical Provider, MD  insulin aspart (NOVOLOG) 100 UNIT/ML injection Inject 3 Units into the skin 3 (three) times daily with meals. Give an additional 5 units prior to meals for cbg >=200   Yes Historical Provider, MD  insulin glargine (LANTUS) 100 UNIT/ML injection Inject 48 Units into the skin daily.    Yes Historical Provider, MD  memantine (NAMENDA) 10 MG tablet Take 10 mg by mouth 2 (two) times daily.   Yes Historical Provider, MD  protein supplement (RESOURCE BENEPROTEIN) POWD Take 1 scoop by mouth 2 (two) times daily.   Yes Historical Provider, MD  risperiDONE (RISPERDAL)  0.5 MG tablet Take 0.5 mg by mouth at bedtime.   Yes Historical Provider, MD   Physical Exam: Filed Vitals:   01/16/13 0637  BP: 142/62  Pulse: 55  Temp: 97.7 F (36.5 C)  TempSrc: Oral  Resp: 19  SpO2: 100%    Physical Exam  Constitutional: Appears in No distress.  HENT: Normocephalic. External right and left ear normal. Dry mucus membranes  Eyes: Conjunctivae are normal. PERRLA, no scleral icterus.  Neck: Neck supple. No JVD. No tracheal deviation. No thyromegaly.  CVS: RRR, S1/S2 +, no murmurs, no gallops, no carotid bruit.  Pulmonary: Effort and breath  sounds normal, no stridor, rhonchi, wheezes, rales.  Abdominal: Soft. BS +,  no distension, tenderness, rebound or guarding.  Musculoskeletal: Normal range of motion. No edema and no tenderness.  Lymphadenopathy: No lymphadenopathy noted, cervical, inguinal. Neuro: Alert. Normal reflexes, no focal neuro deficits Skin: Skin is warm and dry.   Labs on Admission:  Basic Metabolic Panel:  Recent Labs Lab 01/16/13 0720  NA 135  K 3.5  CL 101  CO2 27  GLUCOSE 264*  BUN 18  CREATININE 0.82  CALCIUM 7.6*   Liver Function Tests: No results found for this basename: AST, ALT, ALKPHOS, BILITOT, PROT, ALBUMIN,  in the last 168 hours No results found for this basename: LIPASE, AMYLASE,  in the last 168 hours No results found for this basename: AMMONIA,  in the last 168 hours CBC:  Recent Labs Lab 01/16/13 0720  WBC 11.7*  HGB 12.9  HCT 39.0  MCV 88.0  PLT 303   Cardiac Enzymes: No results found for this basename: CKTOTAL, CKMB, CKMBINDEX, TROPONINI,  in the last 168 hours BNP: No components found with this basename: POCBNP,  CBG:  Recent Labs Lab 01/16/13 0642 01/16/13 0842  GLUCAP 222* 211*    If 7PM-7AM, please contact night-coverage [email or web address] Password [email or web address] 01/16/2013, 10:18 AM

## 2013-01-16 NOTE — ED Notes (Signed)
Bed: WA20 Expected date:  Expected time:  Means of arrival:  Comments: Ems/hypoglycemia-CBG 48 glucogan

## 2013-01-16 NOTE — ED Notes (Signed)
Per EMS pt was picked up at [facility] d/t staff reported CBG of 45.  Per EMS CBG was 86.  Staff at SNF gave 2 rounds of glucagon at 2100 then again this am prior to EMS arrival.  EMS administered 1 amp of d50 bringing pt back to 235 which is close to her baseline.  Pt has dementia is alert but confused at baseline.

## 2013-01-16 NOTE — Progress Notes (Signed)
Hypoglycemic Event  CBG: 52  Treatment: 15 GM carbohydrate snack  Symptoms: None  Follow-up CBG: Time:2130 CBG Result:70  Possible Reasons for Event: Unknown  Comments/MD notified:[place]ahan NP    Jill Francis, Jill Francis  Remember to initiate Hypoglycemia Order Set & complete

## 2013-01-17 LAB — GLUCOSE, CAPILLARY
Glucose-Capillary: 169 mg/dL — ABNORMAL HIGH (ref 70–99)
Glucose-Capillary: 93 mg/dL (ref 70–99)
Glucose-Capillary: 197 mg/dL — ABNORMAL HIGH (ref 70–99)

## 2013-01-17 LAB — CBC
HCT: 35.3 % — ABNORMAL LOW (ref 36.0–46.0)
Hemoglobin: 11.6 g/dL — ABNORMAL LOW (ref 12.0–15.0)
MCH: 29 pg (ref 26.0–34.0)
MCHC: 32.9 g/dL (ref 30.0–36.0)
MCV: 88.3 fL (ref 78.0–100.0)
RBC: 4 MIL/uL (ref 3.87–5.11)

## 2013-01-17 LAB — COMPREHENSIVE METABOLIC PANEL
ALT: 7 U/L (ref 0–35)
AST: 11 U/L (ref 0–37)
Albumin: 2.5 g/dL — ABNORMAL LOW (ref 3.5–5.2)
CO2: 28 mEq/L (ref 19–32)
Calcium: 7.9 mg/dL — ABNORMAL LOW (ref 8.4–10.5)
GFR calc Af Amer: 70 mL/min — ABNORMAL LOW (ref >90)
Glucose, Bld: 49 mg/dL — ABNORMAL LOW (ref 70–99)
Sodium: 139 mEq/L (ref 135–145)
Total Protein: 6 g/dL (ref 6.0–8.3)

## 2013-01-17 LAB — HEMOGLOBIN A1C
Hgb A1c MFr Bld: 7.4 % — ABNORMAL HIGH (ref <5.7)
Mean Plasma Glucose: 166 mg/dL — ABNORMAL HIGH (ref <117)

## 2013-01-17 LAB — MRSA PCR SCREENING: MRSA by PCR: NEGATIVE

## 2013-01-17 MED ORDER — DEXTROSE-NACL 5-0.9 % IV SOLN
INTRAVENOUS | Status: DC
  Administered 2013-01-17: 1000 mL via INTRAVENOUS
  Administered 2013-01-17: 22:00:00 via INTRAVENOUS

## 2013-01-17 MED ORDER — DEXTROSE 50 % IV SOLN
INTRAVENOUS | Status: AC
  Administered 2013-01-17: 02:00:00 50 mL
  Filled 2013-01-17: qty 50

## 2013-01-17 MED ORDER — GLUCOSE 4 G PO CHEW
1.0000 | CHEWABLE_TABLET | Freq: Once | ORAL | Status: AC | PRN
  Filled 2013-01-17: qty 1

## 2013-01-17 MED ORDER — DEXTROSE 50 % IV SOLN
INTRAVENOUS | Status: AC
  Administered 2013-01-17: 07:00:00 50 mL
  Filled 2013-01-17: qty 50

## 2013-01-17 NOTE — Clinical Social Work Psychosocial (Signed)
     Clinical Social Work Department BRIEF PSYCHOSOCIAL ASSESSMENT 01/17/2013  Patient:  Jill Francis, Jill Francis     Account Number:  [id number]     Admit date:  01/16/2013  Clinical Social Worker:  Hattie Perch  Date/Time:  01/17/2013 12:00 M  Referred by:  Physician  Date Referred:  01/17/2013 Referred for  SNF Placement   Other Referral:   Interview type:  Family Other interview type:    PSYCHOSOCIAL DATA Living Status:  FACILITY Admitted from facility:  [facility] Level of care:  Skilled Nursing Facility Primary support name:  Carolann Littler Primary support relationship to patient:  FAMILY Degree of support available:   good    CURRENT CONCERNS Current Concerns  Post-Acute Placement   Other Concerns:    SOCIAL WORK ASSESSMENT / PLAN Patient has dementia and is unable to participate in assessment. CSW called patients niece, Larita Fife, she confirms that patient is a resident of [facility] and would like for patient to return there upon discharge. CSW called [facility] and they state that patient is fine to return upon discharge.   Assessment/plan status:   Other assessment/ plan:   Information/referral to community resources:    PATIENTS/FAMILYS RESPONSE TO PLAN OF CARE: Niece is happy with patient's care at [facility] and looks forward to her being medically stable and being able to return.

## 2013-01-17 NOTE — Progress Notes (Signed)
Patient pulling at New IV site. Mitten placed to prevent IV from being pulled out. Will continue to monitor.

## 2013-01-17 NOTE — ED Provider Notes (Signed)
Medical screening examination/treatment/procedure(s) were conducted as a shared visit with non-physician practitioner(s) and myself.  I personally evaluated the patient during the encounter.  EKG Interpretation     Ventricular Rate:    PR Interval:    QRS Duration:   QT Interval:    QTC Calculation:   R Axis:     Text Interpretation:              Patient with multiple episodes of hypoglycemia over night. Given her age and inability to cry for help if she was feeling symptomatic, will observe under hospitalist care and do med reconciliation.  Audree Camel, MD 01/17/13 [phone]

## 2013-01-17 NOTE — Progress Notes (Signed)
Hypoglycemic Event  CBG: 35  Treatment: D50 IV 50 mL  Symptoms: None  Follow-up CBG: Time:0735 CBG Result:112  Possible Reasons for Event: Unknown  Comments/MD notified: Dr. Elisabeth Pigeon  Charted for Otho Perl   Remember to initiate Hypoglycemia Order Set & complete

## 2013-01-17 NOTE — Care Management Note (Signed)
    Page 1 of 1   01/17/2013     1:08:33 PM   CARE MANAGEMENT NOTE 01/17/2013  Patient:  Jill Francis, Jill Francis   Account Number:  [id number]  Date Initiated:  01/17/2013  Documentation initiated by:  Lanier Clam  Subjective/Objective Assessment:   79 Y/O F ADMITTED W/HYPOGLYCEMIA.     Action/Plan:   FROM HOME.HAS PCP,PHARMACY.   Anticipated DC Date:  01/18/2013   Anticipated DC Plan:  HOME/SELF CARE      DC Planning Services  CM consult      Choice offered to / List presented to:             Status of service:  In process, will continue to follow Medicare Important Message given?   (If response is "NO", the following Medicare IM given date fields will be blank) Date Medicare IM given:   Date Additional Medicare IM given:    Discharge Disposition:    Per UR Regulation:  Reviewed for med. necessity/level of care/duration of stay  If discussed at Long Length of Stay Meetings, dates discussed:    Comments:  01/17/13 [facility] Gerline Ratto RN,BSN NCM [phone]

## 2013-01-17 NOTE — Evaluation (Signed)
Physical Therapy Evaluation Patient Details Name: Jill Francis MRN: [id number] DOB: [birth year] Today's Date: 01/17/2013 Time: 4098-1191 PT Time Calculation (min): 24 min  PT Assessment / Plan / Recommendation History of Present Illness  79 year old female with past medical history of dementia, hypertension, diabetes who presented to [facility] ED from NH after she was found to have low blood sugars around 40's. Patient was given 2 rounds of glucagon in SNF and CBG improved to 80's. Patient is not good historian due to dementia. Once EMS arrived, pt received 1 amp D50 by EMS and her CBG improved to about 200 range  Clinical Impression  Pt will benefit from PT to address deficits below;     PT Assessment  Patient needs continued PT services    Follow Up Recommendations  SNF;Supervision/Assistance - 24 hour    Does the patient have the potential to tolerate intense rehabilitation      Barriers to Discharge        Equipment Recommendations  None recommended by PT    Recommendations for Other Services     Frequency Min 3X/week    Precautions / Restrictions Precautions Precautions: Fall   Pertinent Vitals/Pain No c/o pain      Mobility  Bed Mobility Bed Mobility: Supine to Sit;Sit to Supine;Sitting - Scoot to Edge of Bed Supine to Sit: HOB elevated;3: Mod assist Sitting - Scoot to Edge of Bed: 2: Max assist Sit to Supine: 4: Min assist;HOB flat Details for Bed Mobility Assistance: incr time and assist with UB?LB to come to sit; pt required less assist (LEs only) to return to supine Transfers Transfers: Sit to Stand;Stand to Sit;Stand Pivot Transfers (x 2) Sit to Stand: From chair/3-in-1;From bed;1: +2 Total assist Sit to Stand: Patient Percentage: 50% Stand to Sit: To bed;To chair/3-in-1;1: +2 Total assist Stand to Sit: Patient Percentage: 50% Stand Pivot Transfers: 1: +2 Total assist Stand Pivot Transfers: Patient Percentage: 50% Details for Transfer Assistance: +2 for  safety, wt shift, balance; verbal and visual cues for all aspects of transfer; pt is cooperative; had been incontinent of urine in bed but was able to express toileting need during PT session Ambulation/Gait Ambulation/Gait Assistance: Not tested (comment)    Exercises     PT Diagnosis: Difficulty walking;Generalized weakness  PT Problem List: Decreased activity tolerance;Decreased balance;Decreased mobility;Decreased knowledge of use of DME;Obesity PT Treatment Interventions: DME instruction;Gait training;Functional mobility training;Therapeutic activities;Therapeutic exercise     PT Goals(Current goals can be found in the care plan section) Acute Rehab PT Goals PT Goal Formulation: Patient unable to participate in goal setting Time For Goal Achievement: 01/31/13 Potential to Achieve Goals: Good  Visit Information  Last PT Received On: 01/17/13 Assistance Needed: +1 History of Present Illness: 79 year old female with past medical history of dementia, hypertension, diabetes who presented to [facility] ED from NH after she was found to have low blood sugars around 40's. Patient was given 2 rounds of glucagon in SNF and CBG improved to 80's. Patient is not good historian due to dementia. Once EMS arrived, pt received 1 amp D50 by EMS and her CBG improved to about 200 range       Prior Functioning  Home Living Family/patient expects to be discharged to:: Skilled nursing facility (??) Additional Comments: pt unable to give accurate info due to dementia/word finding problems Prior Function Comments: unable to assess due to pt cognition Communication Communication: Expressive difficulties    Cognition  Cognition Arousal/Alertness: Awake/alert Behavior During Therapy: [facility]  for tasks assessed/performed Overall Cognitive Status: Difficult to assess Memory: Decreased short-term memory Difficult to assess due to: Impaired communication    Extremity/Trunk Assessment Upper Extremity  Assessment Upper Extremity Assessment: Defer to OT evaluation Lower Extremity Assessment Lower Extremity Assessment: Generalized weakness   Balance Static Sitting Balance Static Sitting - Balance Support: No upper extremity supported;Feet unsupported Static Sitting - Level of Assistance: 5: Stand by assistance;4: Min assist Static Sitting - Comment/# of Minutes: , assisted with gown change and pt attempts to participate; minimally restless at times, attempting to pull at monitor leads, easily re-direced  End of Session PT - End of Session Equipment Utilized During Treatment: Gait belt Activity Tolerance: Patient tolerated treatment well Patient left: in bed;with call bell/phone within reach;with bed alarm set  GP Functional Assessment Tool Used: funcional mobility Functional Limitation: Mobility: Walking and moving around Mobility: Walking and Moving Around Current Status (Z6109): At least 40 percent but less than 60 percent impaired, limited or restricted Mobility: Walking and Moving Around Goal Status [phone]): At least 20 percent but less than 40 percent impaired, limited or restricted   [facility] 01/17/2013, 10:20 AM

## 2013-01-17 NOTE — Progress Notes (Signed)
Hypoglycemic Event  CBG: 47  Treatment: D50 IV 50 mL  Symptoms: None  Follow-up CBG: Time:0215 CBG Result:169  Possible Reasons for Event: Unknown  Comments/MD notified:Paged NP Claiborne Billings- Change IV fluids to D5 1/2NS    Forestine Chute  Remember to initiate Hypoglycemia Order Set & complete

## 2013-01-17 NOTE — Progress Notes (Signed)
Patient ID: Jill Francis, female   DOB: [birth year], 79 y.o.   MRN: [id number] [facility] PROGRESS NOTE  Enedelia Martorelli Coombs WUJ:[id number] DOB: 04/23/1927 DOA: 01/16/2013 PCP: Terald Sleeper, MD  Brief narrative: 79 year old female with past medical history of dementia, hypertension, diabetes who presented to [facility] ED from NH after she was found to have low blood sugars around 40's. Patient was given 2 rounds of glucagon in SNF and CBG improved to 80's. Patient is not good historian due to dementia. Once EMS arrived, pt received 1 amp D50 by EMS and her CBG improved to about 200 range   In ED, BP was 142/62 and then 119/47. HR was 52-54, T 97.F. Oxygen saturation was 100% on room air. Glucose was 264. CXR did not reveal acute cardiopulmonary process.   Assessment and Plan:  Principal Problem:  Hypoglycemia  - likely due to insulin and overmedicating - will continue to hold insulin regimen (home dosages)  - appreciate diabetic coordinator consult  - will check A1C for now and will not initiate any insulin until A1C is back - encourage PO intake  Active Problems:  Alzheimer's disease  - continue namenda and aricept  Iron deficiency anemia, unspecified  - continue ferrous sulfate supplementation  Depressive disorder, not elsewhere classified  - continue risperidone   Radiological Exams on Admission:  Dg Chest Portable 1 View  01/16/2013  Slightly prominent lung markings, associated with mild edema or decreased lung volumes. Heart enlarged but may be accentuated by the technique and low lung volumes.   Code Status: Full  Family Communication: Pt at bedside  Disposition Plan: Plan d/c in AM  Manson Passey, MD  [facility]  Pager [phone]  If 7PM-7AM, please contact night-coverage [email or web address] Password [email or web address] 01/17/2013, 8:38 AM   LOS: 1 day    HPI/Subjective: No events overnight.   Objective: Filed Vitals:   01/16/13 1353 01/16/13 2109 01/17/13 0652 01/17/13  0700  BP: 150/84 104/37 105/43   Pulse: 63 63 59   Temp: 97.8 F (36.6 C) 98.6 F (37 C) 97.3 F (36.3 C)   TempSrc: Oral Oral Oral   Resp: 16  20   Height:      Weight:    103.5 kg (228 lb 2.8 oz)  SpO2: 100%  98%     Intake/Output Summary (Last 24 hours) at 01/17/13 0838 Last data filed at 01/16/13 1755  Gross per 24 hour  Intake 1062.5 ml  Output      0 ml  Net 1062.5 ml    Exam:   General:  Pt is alert, follows commands appropriately, not in acute distress  Cardiovascular: Regular rate and rhythm, S1/S2, no murmurs, no rubs, no gallops  Respiratory: Clear to auscultation bilaterally, no wheezing, no crackles, no rhonchi  Abdomen: Soft, non tender, non distended, bowel sounds present, no guarding  Extremities: No edema, pulses DP and PT palpable bilaterally  Data Reviewed: Basic Metabolic Panel:  Recent Labs Lab 01/16/13 0720 01/16/13 1145 01/17/13 0504  NA 135 138 139  K 3.5 4.0 3.8  CL 101 103 105  CO2 27 32 28  GLUCOSE 264* 103* 49*  BUN 18 18 15   CREATININE 0.82 0.84 0.86  CALCIUM 7.6* 7.7* 7.9*  MG  --  2.2  --   PHOS  --  3.5  --    Liver Function Tests:  Recent Labs Lab 01/16/13 1145 01/17/13 0504  AST 14 11  ALT 6 7  ALKPHOS 78 72  BILITOT  0.2* 0.2*  PROT 6.4 6.0  ALBUMIN 2.7* 2.5*   CBC:  Recent Labs Lab 01/16/13 0720 01/16/13 1145 01/17/13 0504  WBC 11.7* 11.2* 8.5  NEUTROABS  --  8.8*  --   HGB 12.9 12.8 11.6*  HCT 39.0 38.7 35.3*  MCV 88.0 87.8 88.3  PLT 303 364 342   CBG:  Recent Labs Lab 01/16/13 2316 01/17/13 0155 01/17/13 0219 01/17/13 0635 01/17/13 0735  GLUCAP 68* 47* 169* 35* 112*    Recent Results (from the past 240 hour(s))  MRSA PCR SCREENING     Status: None   Collection Time    01/16/13 11:13 PM      Result Value Range Status   MRSA by PCR NEGATIVE  NEGATIVE Final   Comment:            The GeneXpert MRSA Assay (FDA     approved for NASAL specimens     only), is one component of a      comprehensive MRSA colonization     surveillance program. It is not     intended to diagnose MRSA     infection nor to guide or     monitor treatment for     MRSA infections.     Studies: Dg Chest Portable 1 View 01/16/2013     IMPRESSION: Slightly prominent lung markings may be associated with mild edema or decreased lung volumes.  Heart appears to be enlarged but may be accentuated by the technique and low lung volumes.   Electronically Signed   By: Richarda Overlie M.D.   On: 01/16/2013 07:28    Scheduled Meds: . calcium-vitamin D  1 tablet Oral TID  . donepezil  10 mg Oral QHS  . ferrous sulfate  325 mg Oral BID  . memantine  10 mg Oral BID  . pneumococcal 23 valent vaccine  0.5 mL Intramuscular Tomorrow-1000  . protein supplement  1 scoop Oral BID  . risperiDONE  0.5 mg Oral QHS  . sodium chloride  3 mL Intravenous Q12H   Continuous Infusions: . dextrose 5 % and 0.9% NaCl 1,000 mL (01/17/13 0217)

## 2013-01-17 NOTE — Progress Notes (Signed)
Inpatient Diabetes Program Recommendations  AACE/ADA: New Consensus Statement on Inpatient Glycemic Control (2013)  Target Ranges:  Prepandial:   less than 140 mg/dL      Peak postprandial:   less than 180 mg/dL (1-2 hours)      Critically ill patients:  140 - 180 mg/dL   Reason for Visit: Diabetes Consult - Hypoglycemia  79 year old female with past medical history of dementia, hypertension, diabetes who presented to [facility] ED from NH after she was found to have low blood sugars around 40's. Patient was given 2 rounds of glucagon in SNF and CBG improved to 80's. Once EMS arrived, pt received 1 amp D50 by EMS and her CBG improved to about 200 range. PTA diabetes meds: Lantus 48 unts QD, Novolog 3 units tidwc, if blood sugar >200mg /dL, give 5 additional units. Eating approx 50% meals.   Results for Jill Francis, Jill Francis (MRN [id number]) as of 01/17/2013 12:31  Ref. Range 01/16/2013 17:25 01/16/2013 18:21 01/16/2013 21:10 01/16/2013 21:35 01/16/2013 23:16 01/17/2013 01:55 01/17/2013 02:19 01/17/2013 06:35 01/17/2013 07:35 01/17/2013 10:17 01/17/2013 11:39  Glucose-Capillary Latest Range: 70-99 mg/dL 55 (L) 409 (H) 52 (L) 70 68 (L) 47 (L) 169 (H) 35 (LL) 112 (H) 93 121 (H)    Results for [name], [name] (MRN [id number]) as of 01/17/2013 12:31  Ref. Range 01/17/2013 05:04  Sodium Latest Range: 135-145 mEq/L 139  Potassium Latest Range: 3.5-5.1 mEq/L 3.8  Chloride Latest Range: 96-112 mEq/L 105  CO2 Latest Range: 19-32 mEq/L 28  BUN Latest Range: 6-23 mg/dL 15  Creatinine Latest Range: 0.50-1.10 mg/dL 9.56  Calcium Latest Range: 8.4-10.5 mg/dL 7.9 (L)  GFR calc non Af Amer Latest Range: >90 mL/min 61 (L)  GFR calc Af Amer Latest Range: >90 mL/min 70 (L)  Glucose Latest Range: 70-99 mg/dL 49 (L)   Recommendations: Consider decreasing Lantus dose when pt is discharged. Would probably decrease by 20%. May need more bolus insulin at meals.  If pt does not eat well, would not receive the meal coverage  insulin.  Please order HgbA1C to assess glycemic control prior to hospitalization.  Thank you. Ailene Ards, RD, LDN, CDE Inpatient Diabetes Coordinator [phone]

## 2013-01-17 NOTE — Progress Notes (Signed)
Patient blood sugar was 55. NP notified and patient was given ice cream and apple juice because she had just pulled out IV. Tried to place new IV and was unsuccessful. Paged IV team. Recheck blood sugar and it was 70. Will continue to monitor.

## 2013-01-18 ENCOUNTER — Encounter: Payer: Self-pay | Admitting: Adult Health

## 2013-01-18 DIAGNOSIS — F329 Major depressive disorder, single episode, unspecified: Secondary | ICD-10-CM

## 2013-01-18 LAB — BASIC METABOLIC PANEL
BUN: 13 mg/dL (ref 6–23)
Chloride: 106 mEq/L (ref 96–112)
Creatinine, Ser: 0.86 mg/dL (ref 0.50–1.10)
GFR calc Af Amer: 70 mL/min — ABNORMAL LOW (ref >90)
GFR calc non Af Amer: 61 mL/min — ABNORMAL LOW (ref >90)
Glucose, Bld: 156 mg/dL — ABNORMAL HIGH (ref 70–99)
Potassium: 4.2 mEq/L (ref 3.5–5.1)

## 2013-01-18 LAB — CBC
HCT: 34.9 % — ABNORMAL LOW (ref 36.0–46.0)
Hemoglobin: 11.3 g/dL — ABNORMAL LOW (ref 12.0–15.0)
MCH: 28.5 pg (ref 26.0–34.0)
MCHC: 32.4 g/dL (ref 30.0–36.0)
MCV: 88.1 fL (ref 78.0–100.0)
Platelets: 332 10*3/uL (ref 150–400)
RDW: 14.9 % (ref 11.5–15.5)

## 2013-01-18 LAB — GLUCOSE, CAPILLARY: Glucose-Capillary: 124 mg/dL — ABNORMAL HIGH (ref 70–99)

## 2013-01-18 MED ORDER — INSULIN GLARGINE 100 UNIT/ML ~~LOC~~ SOLN: 10.0000 [IU] | Freq: Every day | SUBCUTANEOUS | Status: DC

## 2013-01-18 MED ORDER — SITAGLIPTIN PHOSPHATE 25 MG PO TABS: 25.0000 mg | ORAL_TABLET | Freq: Every day | ORAL | Status: DC

## 2013-01-18 NOTE — Progress Notes (Signed)
Patient ID: Jill Francis, female   DOB: 07-22-1927, 79 y.o.   MRN: [id number]  [facility]  Allergies  Allergen Reactions  . Gabapentin     Per mar  . Iodine     Per mar  . Sulfonamide Derivatives     Per mar    Chief Complaint  Patient presents with  . Medical Managment of Chronic Issues    HPI She is being seen for the management of her chronic illnesses. Overall her status remains unchanged. She is not voicing any concerns and is telling me that she is feeling good.   Past Medical History  Diagnosis Date  . Cancer     Skin cancer  . Diabetes mellitus   . Depression   . Asthma     Past Surgical History  Procedure Laterality Date  . Knee surgery    . Skin cancer excision    . Back surgery    . Breast surgery  right partial mastectomy  06/2003    Filed Vitals:   09/20/12 1404  BP: 115/69  Pulse: 72  Weight: 217 lb 9.6 oz (98.703 kg)        Patient's Medications  New Prescriptions   No medications on file  Previous Medications   CALCIUM-VITAMIN D (OSCAL WITH D) 500-200 MG-UNIT PER TABLET    Take 1 tablet by mouth 3 (three) times daily.   DONEPEZIL (ARICEPT) 10 MG TABLET    daily.   FERROUS SULFATE 325 (65 FE) MG TABLET    Take 325 mg by mouth 2 (two) times daily.   INSULIN ASPART (NOVOLOG) 100 UNIT/ML INJECTION    Give 5 units prior to meals for cbg >=200    INSULIN GLARGINE (LANTUS) 100 UNIT/ML INJECTION    Inject 45 Units into the skin daily. And 25 units nightly   namenda xr 14 mg Take daily   RISPERIDONE (RISPERDAL) 0.5 MG TABLET    Take 0.5 mg by mouth at bedtime.  Modified Medications   No medications on file  Discontinued Medications    SIGNIFICANT DIAGNOSTIC EXAMS  04-12-12: wbc 7.1; hgb 12.6; hct 36.9; mcv 84.1; plt 361; glucose 164; bun 13; creat 0.89; k+ 4.3 Na++ 143; liver normal albumin 3.0; chol 173; ldl 110; trig 124; hgb a1c 11.2 05-10-12: wbc 7.6 hgb 11.6; hct 34.7; mcv 84.8; plt 369; glucose 100; bun 11; creat 0.80; k+ 3.8;  Na++  139; liver normal albumin 2.8  08-06-12: tsh 4.498; hgb a1c 8.5     Review of Systems  Respiratory: Negative for cough.   Cardiovascular: Negative for chest pain.  Gastrointestinal: Negative for abdominal pain.  Musculoskeletal: Negative for back pain.  Skin: Negative.   Neurological: Negative for headaches.  Psychiatric/Behavioral: Negative for depression.    Physical Exam  Constitutional:  overweight  Neck: Neck supple. No JVD present. No thyromegaly present.  Cardiovascular: Normal rate, regular rhythm and intact distal pulses.   Respiratory: Effort normal and breath sounds normal. No respiratory distress.  GI: Soft. Bowel sounds are normal. She exhibits no distension. There is no tenderness.  Musculoskeletal: Normal range of motion. She exhibits no edema.  Neurological: She is alert.  Skin: Skin is warm and dry.  Psychiatric: She has a normal mood and affect.      ASSESSMENT/ PLAN:  Type II or unspecified type diabetes mellitus without mention of complication, uncontrolled Will continue  her lantus to 45 units in the am novolog to 5 units prior to meals  for cbg >=150  Alzheimer's disease Is without significant change in status will continue aricept 10 mg nightly will continue namenda xr 14 mg daily   Iron deficiency anemia, unspecified Will continue iron twice daily   Psychosis Will continue risperdal 0.5 mg nightly; will monitor her status

## 2013-01-18 NOTE — Progress Notes (Signed)
Patient cleared for discharge. Packet copied and placed in [place]. ptar called for transportation. Niece notified via voicemail.  Jashley Yellin C. Kaisey Huseby MSW, LCSW [phone]

## 2013-01-18 NOTE — Discharge Summary (Addendum)
Physician Discharge Summary  Jill Francis YQM:[id number] DOB: [birth year] DOA: 01/16/2013  PCP: Terald Sleeper, MD  Admit date: 01/16/2013 Discharge date: 01/18/2013  Recommendations for Outpatient Follow-up:  1. Please note that insulin was discontinued as pt had persistent hypoglycemia with CBG's in 40 with no insulin provided in the hospital  2. CBG's have stabilized with improved oral intake but pt has not required high dose of SSI 3. Pt's A1C is 7.5 on this admission  4. Januvia was started as recommended by endocrinologist on call adn dose of Lantus was decreased from 40 units to 10 units QD 5. Dose can be readjusted based on the CBG control 6. Pt needs to follow up with PCP in 1-2 weeks to ensure that CBG is checked and dose of januvia readjusted as indicated  7. Pt can be started on Insulin SSI as well if needed 8. Please also check BMP to ensure stable electrolytes   Discharge Diagnoses:  Principal Problem:   Hypoglycemia Active Problems:   ANXIETY   Type II or unspecified type diabetes mellitus without mention of complication, uncontrolled   Alzheimer's disease   Iron deficiency anemia, unspecified   Depressive disorder, not elsewhere classified   Discharge Condition: medically stable for discharge to nursing home today; left message to Dr. Leanord Hawking about changes in insulin regimen  Diet recommendation: as tolerated, diabetic but may be regular if CBG's in low range  History of present illness:   79 year old female with past medical history of dementia, hypertension, diabetes who presented to [facility] ED from NH after she was found to have low blood sugars around 40's. Patient was given 2 rounds of glucagon in SNF and CBG improved to 80's. Patient is not good historian due to dementia. Once EMS arrived, pt received 1 amp D50 by EMS and her CBG improved to about 200 range   In ED, BP was 142/62 and then 119/47. HR was 52-54, T 97.F. Oxygen saturation was 100% on room air.  Glucose was 264. CXR did not reveal acute cardiopulmonary process.   Assessment and Plan:   Principal Problem:  Hypoglycemia  - likely due to insulin and overmedicating  - Per diabetic consult we will discontinue meal coverage and sliding scale. Continue Lantus but dose was decreased to 10 units at bedtime. - Started Januvia 25 mg daily. - A1c was 7.4 on this admission Active Problems:  Alzheimer's disease  - continue namenda and aricept  Iron deficiency anemia, unspecified  - continue ferrous sulfate supplementation  Depressive disorder, not elsewhere classified  - continue risperidone    Radiological Exams on Admission:  Dg Chest Portable 1 View 01/16/2013  Slightly prominent lung markings, associated with mild edema or decreased lung volumes. Heart enlarged but may be accentuated by the technique and low lung volumes.    Code Status: Full  Family Communication: Pt at bedside   Signed:  Manson Passey, MD  [facility] 01/18/2013, 9:56 AM  Pager #: [phone]   Discharge Exam: Filed Vitals:   01/18/13 0551  BP: 117/48  Pulse: 60  Temp: 98.1 F (36.7 C)  Resp: 19   Filed Vitals:   01/17/13 0700 01/17/13 1402 01/17/13 2020 01/18/13 0551  BP:  131/55 121/52 117/48  Pulse:  65 66 60  Temp:  98.3 F (36.8 C) 98.1 F (36.7 C) 98.1 F (36.7 C)  TempSrc:  Oral Oral Oral  Resp:  20 19 19   Height:      Weight: 103.5 kg (228 lb 2.8  oz)   105 kg (231 lb 7.7 oz)  SpO2:  98% 99% 98%    General: Pt is not in acute distress Cardiovascular: Regular rate and rhythm, S1/S2 appreciated Respiratory: Clear to auscultation bilaterally, no wheezing, no crackles, no rhonchi Abdominal: Soft, non tender, non distended, bowel sounds +, no guarding Extremities:  pulses palpable bilaterally DP and PT Neuro: Grossly nonfocal  Discharge Instructions    Medication List    STOP taking these medications       insulin aspart 100 UNIT/ML injection  Commonly known as:   novoLOG      TAKE these medications       calcium-vitamin D 500-200 MG-UNIT per tablet  Commonly known as:  OSCAL WITH D  Take 1 tablet by mouth 3 (three) times daily.     donepezil 10 MG tablet  Commonly known as:  ARICEPT  Take 10 mg by mouth at bedtime.     ferrous sulfate 325 (65 FE) MG tablet  Take 325 mg by mouth 2 (two) times daily.     insulin glargine 100 UNIT/ML injection  Commonly known as:  LANTUS  Inject 0.1 mLs (10 Units total) into the skin at bedtime.     memantine 10 MG tablet  Commonly known as:  NAMENDA  Take 10 mg by mouth 2 (two) times daily.     protein supplement Powd  Take 1 scoop by mouth 2 (two) times daily.     risperiDONE 0.5 MG tablet  Commonly known as:  RISPERDAL  Take 0.5 mg by mouth at bedtime.     sitaGLIPtin 25 MG tablet  Commonly known as:  JANUVIA  Take 1 tablet (25 mg total) by mouth daily.         Discharge Orders   Future Orders Complete By Expires   Diet - low sodium heart healthy  As directed    Increase activity slowly  As directed           Follow-up Information   Follow up with Terald Sleeper, MD In 2 weeks.   Specialty:  Internal Medicine   Contact information:   [address] [place] [address] [phone]        The results of significant diagnostics from this hospitalization (including imaging, microbiology, ancillary and laboratory) are listed below for reference.    Significant Diagnostic Studies: Dg Chest Portable 1 View  01/16/2013   CLINICAL DATA:  Shortness of breath.  EXAM: PORTABLE CHEST - 1 VIEW  COMPARISON:  08/09/2008  FINDINGS: Portable view of the chest demonstrate slightly prominent interstitial lung markings. Heart size is upper limits of normal. Postsurgical changes in the lower neck. No focal airspace disease.  IMPRESSION: Slightly prominent lung markings may be associated with mild edema or decreased lung volumes.  Heart appears to be enlarged but may be accentuated by the  technique and low lung volumes.   Electronically Signed   By: Richarda Overlie M.D.   On: 01/16/2013 07:28    Microbiology: Recent Results (from the past 240 hour(s))  MRSA PCR SCREENING     Status: None   Collection Time    01/16/13 11:13 PM      Result Value Range Status   MRSA by PCR NEGATIVE  NEGATIVE Final     Labs: Basic Metabolic Panel:  Recent Labs Lab 01/16/13 0720 01/16/13 1145 01/17/13 0504 01/18/13 0458  NA 135 138 139 139  K 3.5 4.0 3.8 4.2  CL 101 103 105 106  CO2  27 32 28 28  GLUCOSE 264* 103* 49* 156*  BUN 18 18 15 13   CREATININE 0.82 0.84 0.86 0.86  CALCIUM 7.6* 7.7* 7.9* 7.9*  MG  --  2.2  --   --   PHOS  --  3.5  --   --    Liver Function Tests:  Recent Labs Lab 01/16/13 1145 01/17/13 0504  AST 14 11  ALT 6 7  ALKPHOS 78 72  BILITOT 0.2* 0.2*  PROT 6.4 6.0  ALBUMIN 2.7* 2.5*   CBC:  Recent Labs Lab 01/16/13 0720 01/16/13 1145 01/17/13 0504 01/18/13 0458  WBC 11.7* 11.2* 8.5 6.1  NEUTROABS  --  8.8*  --   --   HGB 12.9 12.8 11.6* 11.3*  HCT 39.0 38.7 35.3* 34.9*  MCV 88.0 87.8 88.3 88.1  PLT 303 364 342 332   CBG:  Recent Labs Lab 01/17/13 1017 01/17/13 1139 01/17/13 1632 01/17/13 2111 01/18/13 0918  GLUCAP 93 121* 197* 323* 124*    Time coordinating discharge: Over 30 minutes

## 2013-01-21 ENCOUNTER — Non-Acute Institutional Stay (SKILLED_NURSING_FACILITY): Payer: Medicare HMO | Admitting: Internal Medicine

## 2013-01-21 DIAGNOSIS — E11649 Type 2 diabetes mellitus with hypoglycemia without coma: Secondary | ICD-10-CM

## 2013-01-21 DIAGNOSIS — F0280 Dementia in other diseases classified elsewhere without behavioral disturbance: Secondary | ICD-10-CM

## 2013-01-21 DIAGNOSIS — E1169 Type 2 diabetes mellitus with other specified complication: Secondary | ICD-10-CM

## 2013-01-21 DIAGNOSIS — E46 Unspecified protein-calorie malnutrition: Secondary | ICD-10-CM

## 2013-01-21 NOTE — Progress Notes (Signed)
Patient ID: Jill Francis, female   DOB: 01/21/1928, 79 y.o.   MRN: [id number]  Facility; [facility] Chief complaint; review post return to the facility after admit to [facility] 11/2 through 11/4 History; is a patient who was sent out with refractory hypoglycemia. She apparently had been echo glycemic for 3 mornings both for and was given glucagon but I don't believe her Lantus was ever reduced. She was on 48 units in the morning as well as a sliding scale. Her last hemoglobin A1c I see on the chart was from May at 8.5. The cause for the hypoglycemia before she was sent to the hospital was not totally clear in the hospital and her Lantus was decreased from 40 units to 10 units and changed to at bedtime. She was also started on Januvia at 25 mg a day. Of further note her BUN and creatinine were normal liver function tests normal. Her albumin however was 2.5 repeated at 2.7. White count was initially elevated at 11.7 but on November 4 this was down to 6.1 her hemoglobin was 11.3.  Since her return to the facility her blood sugars have been in the high 100s to low 200s in the morning and higher than that later in the day. She does not have a sliding scale.  Past Medical History  Diagnosis Date  . Cancer     Skin cancer  . Diabetes mellitus   . Depression   . Asthma   . Alzheimer's disease 08/19/2012  . Type II or unspecified type diabetes mellitus without mention of complication, uncontrolled 08/19/2012   Current medication; Os-Cal with D. one tablet 3 times daily, Aricept 10 daily, ferrous sulfate 325 twice a day, Lantus 10 units at bedtime, Amanda 10 twice a day, Risperdal 0.5 each bedtime, Januvia 25 daily.  Social; long-standing resident of this facility. She has a DO NOT RESUSCITATE order on the chart but once again I don't see any form  Review of systems; not possible in this patient due to dementia  Physical examination HEENT; I see no oral lesions Respiratory clear entry  bilaterally Cardiac heart sounds are normal she appears to be euvolemic Abdomen; quite distended more so in the upper abdomen than the lower. Nevertheless there is no overt tenderness and no masses. Extremities; 2+ edema to the midcalf no evidence of a DVT I think she has PAD as well is chronic venous stasis. Mental status; probable severe dementia. She cannot tell me her age to place the year  Impression/plan #1 diabetic hypoglycemia; her insulin has been dramatically reduced and changed from the morning to at at bedtime. She is also on Januvia. However her blood sugars are higher now throughout the day over 300 later on. She will need more insulin than this. I will gently increase this to 15 units at at bedtime. The cause behind her original hypoglycemia isn't totally clear although she has moderate to severe hyper hypoalbuminemia suggesting a protein calorie malnutrition. Tight diabetic control isn't indicated in this setting #2 severe dementia listed as Alzheimer's disease #3 moderate protein calorie calorie malnutrition #4 probable PAD  I suspect a lot of this is due to inadequate oral intake secondary to her advanced dementia. Going to increase her Lantus but I do agree with the overall concept that tight diabetic control probably just isn't indicated in this case.

## 2013-01-24 ENCOUNTER — Non-Acute Institutional Stay (SKILLED_NURSING_FACILITY): Payer: Medicare HMO | Admitting: Internal Medicine

## 2013-01-24 DIAGNOSIS — F0280 Dementia in other diseases classified elsewhere without behavioral disturbance: Secondary | ICD-10-CM

## 2013-01-24 DIAGNOSIS — D509 Iron deficiency anemia, unspecified: Secondary | ICD-10-CM

## 2013-01-24 DIAGNOSIS — F411 Generalized anxiety disorder: Secondary | ICD-10-CM

## 2013-01-28 NOTE — Progress Notes (Signed)
PROGRESS NOTE  DATE: 01/24/13  FACILITY: [facility]  LEVEL OF CARE: SNF  Routine Visit  CHIEF COMPLAINT:  Manage diabetes mellitus and dementia  HISTORY OF PRESENT ILLNESS:  REASSESSMENT OF ONGOING PROBLEM(S):  DM:pt's DM is unstable.  Pt denies polyuria, polydipsia, polyphagia, changes in vision or hypoglycemic episodes.  No complications noted from the medication presently being used.  Last hemoglobin A1c is: 11.2 in 1/14, in 5/14 hemoglobin A1c 8.5, in 9/14 HbA1c 8.6.  DEMENTIA: The dementia remaines stable and continues to function adequately in the current living environment with supervision.  The patient has had little changes in behavior. No complications noted from the medications presently being used.  PAST MEDICAL HISTORY : Reviewed.  No changes.  CURRENT MEDICATIONS: Reviewed per [facility]  REVIEW OF SYSTEMS:  GENERAL: no change in appetite, no fatigue, no weight changes, no fever, chills or weakness RESPIRATORY: no cough, SOB, DOE, wheezing, hemoptysis CARDIAC: no chest pain, edema or palpitations GI: no abdominal pain, diarrhea, constipation, heart burn, nausea or vomiting  PHYSICAL EXAMINATION  VS:  T 97.1      P 60   RR 20      BP 130/76    POX %     WT (Lb) 224.6  GENERAL: no acute distress, obese body habitus NECK: supple, trachea midline, no neck masses, no thyroid tenderness, no thyromegaly RESPIRATORY: breathing is even & unlabored, BS CTAB CARDIAC: RRR, no murmur,no extra heart sounds, +1 bilateral edema GI: abdomen soft, normal BS, no masses, no tenderness, no hepatomegaly, no splenomegaly PSYCHIATRIC: the patient is alert & oriented to person, affect & behavior appropriate  LABS/RADIOLOGY:  9-14 cbc nl, glc 312, alb 3 ow cmp nl  5/14 TSH 4.498  2/14 hemoglobin 11.6, MCV 84.8 otherwise CBC normal, glucose 100, total protein 5.6, albumin 2.8 otherwise CMP normal  ASSESSMENT/PLAN:  diabetes mellitus-uncontrolled secondary to noncompliance.  Lantus was increased. dementia-stable. iron deficiency anemia-Hb nl.  Dc Fe.  Recheck Hb in 1 month. depression-stable.  CPT CODE: 40981

## 2013-02-14 ENCOUNTER — Non-Acute Institutional Stay (SKILLED_NURSING_FACILITY): Payer: Medicare HMO | Admitting: Internal Medicine

## 2013-02-28 ENCOUNTER — Non-Acute Institutional Stay (SKILLED_NURSING_FACILITY): Payer: Medicare HMO | Admitting: Internal Medicine

## 2013-02-28 ENCOUNTER — Encounter: Payer: Self-pay | Admitting: Internal Medicine

## 2013-02-28 DIAGNOSIS — D509 Iron deficiency anemia, unspecified: Secondary | ICD-10-CM

## 2013-02-28 DIAGNOSIS — F0280 Dementia in other diseases classified elsewhere without behavioral disturbance: Secondary | ICD-10-CM

## 2013-02-28 DIAGNOSIS — F329 Major depressive disorder, single episode, unspecified: Secondary | ICD-10-CM

## 2013-02-28 NOTE — Progress Notes (Signed)
PROGRESS NOTE  DATE: 02/28/13  FACILITY: [facility]  LEVEL OF CARE: SNF  Routine Visit  CHIEF COMPLAINT:  Manage diabetes mellitus and dementia  HISTORY OF PRESENT ILLNESS:  REASSESSMENT OF ONGOING PROBLEM(S):  DM:pt's DM is unstable.  Pt denies polyuria, polydipsia, polyphagia, changes in vision or hypoglycemic episodes.  No complications noted from the medication presently being used.  Last hemoglobin A1c is: 11.2 in 1/14, in 5/14 hemoglobin A1c 8.5, in 9/14 HbA1c 8.6, on 02-27-13 hemoglobin A1c 9.5  DEMENTIA: The dementia remaines stable and continues to function adequately in the current living environment with supervision.  The patient has had little changes in behavior. No complications noted from the medications presently being used.  DEPRESSION: The depression remains stable. Patient denies ongoing feelings of sadness, insomnia, anedhonia or lack of appetite. No complications reported from the medications currently being used. Staff do not report behavioral problems.  PAST MEDICAL HISTORY : Reviewed.  No changes.  CURRENT MEDICATIONS: Reviewed per [facility]  REVIEW OF SYSTEMS:  GENERAL: no change in appetite, no fatigue, no weight changes, no fever, chills or weakness RESPIRATORY: no cough, SOB, DOE, wheezing, hemoptysis CARDIAC: no chest pain, edema or palpitations GI: no abdominal pain, diarrhea, constipation, heart burn, nausea or vomiting  PHYSICAL EXAMINATION  VS:  T 98.2      P 70   RR 20      BP 130/69    POX %     WT (Lb) 218.2  GENERAL: no acute distress, obese body habitus EYES: Normal sclerae, normal conjunctivae, no discharge NECK: supple, trachea midline, no neck masses, no thyroid tenderness, no thyromegaly LYMPHATICS: No cervical lymphadenopathy, no supraclavicular lymphadenopathy RESPIRATORY: breathing is even & unlabored, BS CTAB CARDIAC: RRR, no murmur,no extra heart sounds, +1 bilateral edema GI: abdomen soft, normal BS, no masses, no tenderness,  no hepatomegaly, no splenomegaly PSYCHIATRIC: the patient is alert & oriented to person, affect & behavior appropriate  LABS/RADIOLOGY:  9-14 cbc nl, glc 312, alb 3 ow cmp nl  5/14 TSH 4.498  2/14 hemoglobin 11.6, MCV 84.8 otherwise CBC normal, glucose 100, total protein 5.6, albumin 2.8 otherwise CMP normal  ASSESSMENT/PLAN:  diabetes mellitus-uncontrolled secondary to noncompliance. Increase Lantus to 30 units each bedtime dementia-stable. iron deficiency anemia- Fe discontinued.  Recheck Hb. depression-stable.  CPT CODE: 47829

## 2013-03-29 ENCOUNTER — Encounter: Payer: Self-pay | Admitting: Internal Medicine

## 2013-03-29 NOTE — Progress Notes (Signed)
Patient ID: Jill Francis, female   DOB: 1927/11/09, 80 y.o.   MRN: [id number]          PROGRESS NOTE  DATE: 02/14/2013    FACILITY:  [facility]  LEVEL OF CARE: SNF (31)  Acute Visit  CHIEF COMPLAINT:  Manage diabetes mellitus.     HISTORY OF PRESENT ILLNESS: I was requested by the staff to assess the patient regarding above problem(s):  DM:pt's DM is unstable.  Staff report that her CBGs are running mostly in the 300 range.  Pt denies polyuria, polydipsia, polyphagia, changes in vision or hypoglycemic episodes.  No complications noted from the medication presently being used.  Last hemoglobin A1c is:  8.6 in 11/2012.    PAST MEDICAL HISTORY : Reviewed.  No changes.  CURRENT MEDICATIONS: Reviewed per [facility]  REVIEW OF SYSTEMS:  GENERAL: no change in appetite, no fatigue, no weight changes, no fever, chills or weakness RESPIRATORY: no cough, SOB, DOE,, wheezing, hemoptysis CARDIAC: no chest pain, edema or palpitations GI: no abdominal pain, diarrhea, constipation, heart burn, nausea or vomiting  PHYSICAL EXAMINATION  GENERAL: no acute distress, moderately obese body habitus NECK: supple, trachea midline, no neck masses, no thyroid tenderness, no thyromegaly RESPIRATORY: breathing is even & unlabored, BS CTAB CARDIAC: RRR, no murmur,no extra heart sounds, no edema GI: abdomen soft, normal BS, no masses, no tenderness, no hepatomegaly, no splenomegaly PSYCHIATRIC: the patient is alert & oriented to person, affect & behavior appropriate  ASSESSMENT/PLAN:  Diabetes mellitus.  Uncontrolled.  Increase Lantus to 25 U q.h.s.    THN Metrics:   BP:  124/78.  Not on aspirin.  Former smoker.    CPT CODE: 70623

## 2013-06-29 ENCOUNTER — Non-Acute Institutional Stay (SKILLED_NURSING_FACILITY): Payer: Medicare HMO | Admitting: Internal Medicine

## 2013-06-29 ENCOUNTER — Encounter: Payer: Self-pay | Admitting: Internal Medicine

## 2013-06-29 DIAGNOSIS — R059 Cough, unspecified: Secondary | ICD-10-CM | POA: Insufficient documentation

## 2013-06-29 DIAGNOSIS — R05 Cough: Secondary | ICD-10-CM | POA: Insufficient documentation

## 2013-06-29 DIAGNOSIS — F3289 Other specified depressive episodes: Secondary | ICD-10-CM

## 2013-06-29 DIAGNOSIS — F0280 Dementia in other diseases classified elsewhere without behavioral disturbance: Secondary | ICD-10-CM

## 2013-06-29 DIAGNOSIS — IMO0001 Reserved for inherently not codable concepts without codable children: Secondary | ICD-10-CM

## 2013-06-29 DIAGNOSIS — F329 Major depressive disorder, single episode, unspecified: Secondary | ICD-10-CM

## 2013-06-29 DIAGNOSIS — E1165 Type 2 diabetes mellitus with hyperglycemia: Principal | ICD-10-CM

## 2013-06-29 DIAGNOSIS — G309 Alzheimer's disease, unspecified: Secondary | ICD-10-CM

## 2013-06-29 DIAGNOSIS — F29 Unspecified psychosis not due to a substance or known physiological condition: Secondary | ICD-10-CM

## 2013-06-29 DIAGNOSIS — D509 Iron deficiency anemia, unspecified: Secondary | ICD-10-CM

## 2013-06-29 NOTE — Progress Notes (Signed)
Patient ID: Jill Francis, female   DOB: 12/25/1927, 80 y.o.   MRN: [id number]   This is a routine visit.  Level of care skilled.  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  History of present illness.  Patient is a pleasant 80 year old female apparently has been quite stable-last few days she's had a cough apparently nonproductive she has received Mucinex but apparently her medications have to be crushed in this has proven difficult with the Mucinex.  She is afebrile she does not complaining of any shortness of breath.  Regards to her other issues she is a type II diabetic apparently has had some history of hypoglycemia per chart review although appears her hemoglobin A1c was elevated at 9.5 back in December--appears her Lantus was increased several months ago secondary to elevated sugars.   appears her sugars in the morning tend to run in the mid 100s and at at bedtime run largely in the 200s although I do see occasional 3- 400s-Will update a hemoglobin A1c it appears this has improved probably since December but would like to see HBG A1c for followup and possible adjust medication accordingly.  She is on Januvia 25 mg a day as well as Lantus 30 units a day.  Family medical social history has been reviewed per discharge summary on 01/18/2013.  Medications have been reviewed per MAR.  Review of systems.  Somewhat limited secondary to dementia.  She is not complaining of any fever or chills.  Respiratory does not complaining of shortness of breath according to staff has a nonproductive cough.  Cardiac-does not complaining of chest pain with apparently baseline low extremity edema.  Muscle skeletal does not complaining of joint pain.  Neurologic not complaining of headache or dizziness.  Psych does have a history of dementia with depression apparently this  has been fairly stable she is seen by psychiatric services.  Physical exam.  Temperature is 98.5 pulse 64 respirations 20 blood pressure 120/74.  In general this is a somewhat obese pleasant elderly female in no distress sitting in her wheelchair.  Her skin is warm and dry.  Eyes pupils appear reactive to light sclerae and conjunctivae are clear appears to have possibly a small amount of watery discharge.  Oropharynx clear mucous membranes moist.  Chest is clear to auscultation with somewhat shallow air entry no labored breathing.  Heart distant heart sounds regular rate and rhythm I cannot detect a murmur gallop or rub.  She does have a was a trace 1+ lower extremity edema.  Abdomen obese soft nontender positive bowel sounds.  Muscle skeletal she ambulates in a wheelchair appears without difficulty he moves extremities at baseline strength appears to be intact.  Neurologic grossly intact cranial nerves intact no lateralizing findings.  Psych she is oriented to self pleasant and appropriate labs.  02/24/2013.  Hemoglobin A1c-9.5.  12/03/2012.  WBC 7.7 hemoglobin 12.3 platelets 370.   sodium 138 potassium 4.8 BUN 14 creatinine 0.98.  Liver function tests within normal limits except bilirubin of 0.2 albumin of 3.0 calcium 7.6.  At that point hemoglobin A1c was 8.6.  08/06/2013.  SEG-3.151.  Assessment and plan.  1-Cough--- will discontinue the Mucinex and start Robitussin 10 mL Qt 6 hours routine for 3 days and then when necessary for 5 additional days-also will order chest x-ray and vital signs  pulse ox every shift for 72 hours----- at this point does not appear show any  sign of distress but would like to keep an eye on it  2-diabetes type 2- is on Lantus as well as Januvia --as noted above sugars appear to run mid 100s in the morning-- higher at at bedtime-Will update hemoglobin A1c before adjusting meds.  History of dementia--depression this appears to be stable  she is on Aricept as well as Namenda has Ativan when necessary as well she is gettinggRisperdal as needed at night for apparently some history of behaviors.  #4-history of anemia--apparently she has been on iron in the past this was c discontinued and I suspect to stability will need to recheck CBC  Of note would like to update a CBC and CMP for updated values clinically she appears stable.  [id number]

## 2013-07-12 ENCOUNTER — Non-Acute Institutional Stay (SKILLED_NURSING_FACILITY): Payer: Medicare HMO | Admitting: Internal Medicine

## 2013-07-12 DIAGNOSIS — F0280 Dementia in other diseases classified elsewhere without behavioral disturbance: Secondary | ICD-10-CM

## 2013-07-12 DIAGNOSIS — F411 Generalized anxiety disorder: Secondary | ICD-10-CM

## 2013-07-12 DIAGNOSIS — F3289 Other specified depressive episodes: Secondary | ICD-10-CM

## 2013-07-12 DIAGNOSIS — E1165 Type 2 diabetes mellitus with hyperglycemia: Principal | ICD-10-CM

## 2013-07-12 DIAGNOSIS — IMO0001 Reserved for inherently not codable concepts without codable children: Secondary | ICD-10-CM

## 2013-07-12 DIAGNOSIS — F329 Major depressive disorder, single episode, unspecified: Secondary | ICD-10-CM

## 2013-07-12 DIAGNOSIS — G309 Alzheimer's disease, unspecified: Secondary | ICD-10-CM

## 2013-07-13 ENCOUNTER — Encounter: Payer: Self-pay | Admitting: Internal Medicine

## 2013-07-13 ENCOUNTER — Non-Acute Institutional Stay (SKILLED_NURSING_FACILITY): Payer: Medicare HMO | Admitting: Internal Medicine

## 2013-07-13 DIAGNOSIS — R05 Cough: Secondary | ICD-10-CM

## 2013-07-13 DIAGNOSIS — R059 Cough, unspecified: Secondary | ICD-10-CM

## 2013-07-13 NOTE — Progress Notes (Signed)
Patient ID: Jill Francis, female   DOB: 1928-01-26, 80 y.o.   MRN: [id number]   This is an acute visit.  Level of care skilled.  Facility AF.  2 complaint S. acute visit secondary to cough.  History of present illness.  Patient is a pleasant elderly resident with a history of dementia who has developed a nonproductive cough.  She actually had a cough earlier this month and was started on Robitussin chest x-ray was ordered which did not show any acute process.  Apparently she has developed a cough again this is apparently nonproductive.  Her vital signs are stable O2 saturations in the 90s on room air.  Patient is a somewhat poor historian secondary to dementia but she does not complain of any shortness of breath or chest pain.  Family medical social history as been reviewed her previous progress note on 06/29/2013 as well as discharge summary 01/18/2013.  Medications have been reviewed per MAR.  Review of systems again somewhat limited secondary to dementia.  In general no complaints of fever or chills.  Respiratory does not complaining of shortness of breath has a dry cough.  Cardiac-no complaints of chest pain as fairly minimal lower extremity edema.  GI no complaints of abdominal pain nausea or vomiting.  Physical exam.  Temperature is 97.1 pulse 70 respirations 18 blood pressure 135/78 O2 saturation 94% on room air.  In general this is a pleasant elderly resident in no distress sitting comfortably in her wheelchair.  Her skin is warm and dry.  Oropharynx clear mucous membranes moist.  Chest somewhat poor respiratory effort generally clear to auscultation with some mild bronchial sounds on expiration in the bases.  Heart regular rate and rhythm somewhat distant heart sounds that she has trace lower extremity edema.  Muscle skeletal-moves extremities at baseline she ambulates in a wheelchair.  Labs.  06/30/2013.  Sodium 139 potassium 4 BUN 10 creatinine  0.7.  Liver function tests within normal limits except albumin of 2.7.  WBC 7.2 hemoglobin 11.9 platelets 3:15.  Assessment and plan.  1 cough-we'll reorder chest x-ray to make sure there's been no progression here-also will restart Robitussin apparently she received relief from this will keep her on a longer course of 10 mL every 6 hours routine for 5 days and then when necessary for 7 additional days-also monitor vital signs pulse ox every shift for 72 hours to keep an eyeI on her status.  [id number]

## 2013-07-14 NOTE — Progress Notes (Signed)
          PROGRESS NOTE  DATE: 07/12/13  FACILITY: [facility]  LEVEL OF CARE: SNF  Routine Visit  CHIEF COMPLAINT:  Manage diabetes mellitus, depression and dementia  HISTORY OF PRESENT ILLNESS:  REASSESSMENT OF ONGOING PROBLEM(S):  DM:pt's DM is unstable.  Pt denies polyuria, polydipsia, polyphagia, changes in vision or hypoglycemic episodes.  No complications noted from the medication presently being used.  Last hemoglobin A1c is: 11.2 in 1/14, in 5/14 hemoglobin A1c 8.5, in 9/14 HbA1c 8.6, on 02-27-13 hemoglobin A1c 9.5, in 4-15 hemoglobin A1c 9.2.  DEMENTIA: The dementia remaines stable and continues to function adequately in the current living environment with supervision.  The patient has had little changes in behavior. No complications noted from the medications presently being used.  DEPRESSION: The depression remains stable. Patient denies ongoing feelings of sadness, insomnia, anedhonia or lack of appetite. No complications reported from the medications currently being used. Staff do not report behavioral problems.  PAST MEDICAL HISTORY : Reviewed.  No changes.  CURRENT MEDICATIONS: Reviewed per [facility]  REVIEW OF SYSTEMS:  GENERAL: no change in appetite, no fatigue, no weight changes, no fever, chills or weakness RESPIRATORY: no cough, SOB, DOE, wheezing, hemoptysis CARDIAC: no chest pain, edema or palpitations GI: no abdominal pain, diarrhea, constipation, heart burn, nausea or vomiting  PHYSICAL EXAMINATION  VS:  See VS section  GENERAL: no acute distress, obese body habitus EYES: Normal sclerae, normal conjunctivae, no discharge NECK: supple, trachea midline, no neck masses, no thyroid tenderness, no thyromegaly LYMPHATICS: No cervical lymphadenopathy, no supraclavicular lymphadenopathy RESPIRATORY: breathing is even & unlabored, BS CTAB CARDIAC: RRR, no murmur,no extra heart sounds, +1 bilateral edema GI: abdomen soft, normal BS, no masses, no tenderness, no  hepatomegaly, no splenomegaly PSYCHIATRIC: the patient is alert & oriented to person, affect & behavior appropriate  LABS/RADIOLOGY: 4-15 total protein 5.3, albumin 2.7, CO2 33 otherwise CMP normal, CBC normal, TSH 4.262 9-14 cbc nl, glc 312, alb 3 ow cmp nl  5/14 TSH 4.498  2/14 hemoglobin 11.6, MCV 84.8 otherwise CBC normal, glucose 100, total protein 5.6, albumin 2.8 otherwise CMP normal  ASSESSMENT/PLAN:  diabetes mellitus-uncontrolled secondary to noncompliance. Increase januvia to 50 mg daily dementia-stable. depression-stable. Anxiety-Ativan was started.  CPT CODE: 56314  Gayani Y. Durwin Reges, [place] [phone]

## 2013-10-03 ENCOUNTER — Non-Acute Institutional Stay (SKILLED_NURSING_FACILITY): Payer: Medicare HMO | Admitting: Internal Medicine

## 2013-10-03 ENCOUNTER — Encounter: Payer: Self-pay | Admitting: Internal Medicine

## 2013-10-03 DIAGNOSIS — F0390 Unspecified dementia without behavioral disturbance: Secondary | ICD-10-CM

## 2013-10-03 DIAGNOSIS — IMO0001 Reserved for inherently not codable concepts without codable children: Secondary | ICD-10-CM

## 2013-10-03 DIAGNOSIS — D509 Iron deficiency anemia, unspecified: Secondary | ICD-10-CM

## 2013-10-03 DIAGNOSIS — F29 Unspecified psychosis not due to a substance or known physiological condition: Secondary | ICD-10-CM

## 2013-10-03 DIAGNOSIS — E1165 Type 2 diabetes mellitus with hyperglycemia: Secondary | ICD-10-CM

## 2013-10-03 NOTE — Progress Notes (Signed)
Patient ID: Jill Francis, female   DOB: 05-25-1927, 80 y.o.   MRN: [id number]   This is a routine visit.  Level of care skilled.  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  History of present illness .  Patient is a pleasant 80 year old female apparently has been quite stable  she is a type II diabetic apparently has had some history of hypoglycemia per chart review although appears her hemoglobin A1c was elevated at 9.5 back in December--appears her Lantus was increased l months ago secondary to elevated sugars Sugars in the morning average more in the higher 100s range 113-228 although anything in the 200s is quite rare.  Later in the day sugars are more variable ranging 145-379 averaging more in the 200s range.  At night sugars appear to run to2 18-300s Generally--. Although appears recent nights 10 PM readings were more in the mid higher 100.   Marland Kitchen  She is on Januvia 50 mg a day as well as Lantus 30 units a day .  Family medical social history has been reviewed per discharge summary on 01/18/2013.   Medications have been reviewed per MAR.   Review of systems.  Somewhat limited secondary to dementia.  She is not complaining of any fever or chills.  Respiratory does not complaining of shortness of breath or cough today  .  Cardiac-does not complaining of chest pain with apparently baseline low extremity edema--this appears quite mild.  Muscle skeletal does not complaining of joint pain.  Neurologic not complaining of headache or dizziness.  Psych does have a history of dementia with depression apparently this has been fairly stable she is seen by psychiatric services.   Physical exam .   Temperature 97.0 pulse 70 respirations 18 blood pressure 123/75  In general this is a somewhat obese pleasant elderly female in no distress sitting in her wheelchair.  Her skin is warm and dry.    Eyes pupils appear reactive to light sclerae and conjunctivae are clear appears to have possibly a small amount of watery discharge.  Oropharynx clear mucous membranes moist.  Chest is clear to auscultation with somewhat shallow air entry no labored breathing.   regular rate and rhythm I cannot detect a murmur gallop or rub.  She does have a was a trace  lower extremity edema.  Abdomen obese soft nontender positive bowel sounds.  Muscle skeletal she ambulates in a wheelchair appears without difficulty he moves extremities at baseline strength appears to be intact.  Neurologic grossly intact cranial nerves intact no lateralizing findings.  Psych she is oriented to self pleasant and appropriate    LABS 07/18/2013.  Sodium 142 potassium 3.4 BUN 12 creatinine 0.8.  Calcium 7.7.  07/14/2013.  WBC 10.8 hemoglobin 11.2 platelets 300.  BNP-33-  Liver function tests within normal limits except albumin of 2.7.  Hemoglobin A1c 9.2.  MGQ-6.761  .    .  02/24/2013.  Hemoglobin A1c-9.5.  12/03/2012.  WBC 7.7 hemoglobin 12.3 platelets 370.  sodium 138 potassium 4.8 BUN 14 creatinine 0.98.  Liver function tests within normal limits except bilirubin of 0.2 albumin of 3.0 calcium 7.6.  At that point hemoglobin A1c was 8.6.   08/06/2013.  PJK-9.326 .  Assessment and plan.   1-diabetes type 2- is on Lantus as well as Januvia --as noted above--but a bit of variability-it appears per [facility] that she has gotten readings at 8 PM and 10 PM but  last week  has not gotten any in the morning-will write t order to make sure CBGs are obtained a.c. and at bedtime to get a better idea recently how she's been running ---Will update hemoglobin A1c before adjusting meds.   History of dementia--depression this appears to be stable she is on Aricept as well as Namenda has Ativan when necessary as well she is gettinggRisperdal as needed at night for apparently some history of behaviors.   #3-history of  anemia--apparently she has been on iron in the past this was c discontinued and I suspect to stability will need to recheck CBC   Of note also will update CMP I do note  minimal hypokalemia on recent lab  [phone]    [phone]

## 2013-10-14 ENCOUNTER — Non-Acute Institutional Stay (SKILLED_NURSING_FACILITY): Payer: Medicare HMO | Admitting: Internal Medicine

## 2013-10-14 ENCOUNTER — Encounter: Payer: Self-pay | Admitting: Internal Medicine

## 2013-10-14 DIAGNOSIS — E1165 Type 2 diabetes mellitus with hyperglycemia: Principal | ICD-10-CM

## 2013-10-14 DIAGNOSIS — N182 Chronic kidney disease, stage 2 (mild): Secondary | ICD-10-CM

## 2013-10-14 DIAGNOSIS — IMO0001 Reserved for inherently not codable concepts without codable children: Secondary | ICD-10-CM

## 2013-10-14 DIAGNOSIS — D509 Iron deficiency anemia, unspecified: Secondary | ICD-10-CM

## 2013-10-14 NOTE — Assessment & Plan Note (Addendum)
Pt's calculated CrCl was >60  (63) and GFR was >60. Cr was 0.7.

## 2013-10-14 NOTE — Assessment & Plan Note (Addendum)
10/04/13 A1c 8.4 down from 9.2 in 06/2013;areview of BS from 7/17 -30 revealed majority of sugars were 150 - 250 range. There were 2 outliers , a 400 and 335 2 days in a row and 2 lows of 90, and 82 in am. There was no detectable patten of lows and highs, readings were randomly every where. Two options - SSI at meals which is a lot of sticks or increase Januvia to 100 mg daily up from 50 mg. Her CrCl is >60 so she can tolerate it. Her last HbA1c was fine considering her age and comorbidities so I would opt over looser control vs multiple injections.

## 2013-10-14 NOTE — Progress Notes (Signed)
MRN: [id number] Name: Jill Francis  Sex: female Age: 80 y.o. DOB: 1927-10-08  [facility] Facility/Room: 216 Level Of Care: SNF Provider: Inocencio Homes D Emergency Contacts: Extended Emergency Contact Information Primary Emergency Contact: [name] [place] of [place] Phone: [phone] Work Phone: [phone] Mobile Phone: [phone] Relation: Niece Secondary Emergency Contact: [place] Phone: [phone] Relation: None  Code Status: DNR  Allergies: Gabapentin; Iodine; and Sulfonamide derivatives  Chief Complaint  Patient presents with  . Medical Management of Chronic Issues    HPI: Patient is 80 y.o. female who is being seen for concern over elevated BS.    Past Medical History  Diagnosis Date  . Cancer     Skin cancer  . Diabetes mellitus   . Depression   . Asthma   . Alzheimer's disease 08/19/2012  . Type II or unspecified type diabetes mellitus without mention of complication, uncontrolled 08/19/2012    Past Surgical History  Procedure Laterality Date  . Knee surgery    . Skin cancer excision    . Back surgery    . Breast surgery  right partial mastectomy  06/2003      Medication List       This list is accurate as of: 10/14/13 10:07 PM.  Always use your most recent med list.               calcium-vitamin D 500-200 MG-UNIT per tablet  Commonly known as:  OSCAL WITH D  Take 1 tablet by mouth 3 (three) times daily.     donepezil 10 MG tablet  Commonly known as:  ARICEPT  Take 10 mg by mouth at bedtime.     insulin glargine 100 UNIT/ML injection  Commonly known as:  LANTUS  Inject 30 Units into the skin at bedtime.     LORazepam 0.5 MG tablet  Commonly known as:  ATIVAN  Place 0.5 mg onto the skin every 8 (eight) hours as needed for anxiety. GEL     memantine 10 MG tablet  Commonly known as:  NAMENDA  Take 10 mg by mouth 2 (two) times daily.     protein supplement Powd   Take 1 scoop by mouth 2 (two) times daily.     risperiDONE 0.5 MG tablet  Commonly known as:  RISPERDAL  Take 0.5 mg by mouth at bedtime.     sitaGLIPtin 25 MG tablet  Commonly known as:  JANUVIA  Take 50 mg by mouth daily.        Meds ordered this encounter  Medications  . LORazepam (ATIVAN) 0.5 MG tablet    Sig: Place 0.5 mg onto the skin every 8 (eight) hours as needed for anxiety. GEL  . sitaGLIPtin (JANUVIA) 25 MG tablet    Sig: Take 50 mg by mouth daily.    Immunization History  Administered Date(s) Administered  . Influenza Whole 12/27/2012  . Pneumococcal Polysaccharide-23 01/17/2013    History  Substance Use Topics  . Smoking status: Former Research scientist (life sciences)  . Smokeless tobacco: Not on file  . Alcohol Use: No    Review of Systems  DATA OBTAINED: from patient; no c/o GENERAL: Feels well no fevers, fatigue, appetite changes SKIN: No itching, rash HEENT: No complaint RESPIRATORY: No cough, wheezing, SOB CARDIAC: No chest pain, palpitations, lower extremity edema  GI: No abdominal pain, No N/V/D or constipation, No heartburn or reflux  GU: No dysuria, frequency or urgency, or incontinence  MUSCULOSKELETAL: No unrelieved bone/joint  pain NEUROLOGIC: No headache, dizziness or focal weakness PSYCHIATRIC: No overt anxiety or sadness. Sleeps well.   Filed Vitals:   10/14/13 1049  BP: 140/73  Pulse: 62  Temp: 97 F (36.1 C)  Resp: 16    Physical Exam  GENERAL APPEARANCE: Alert, conversant. Appropriately groomed. No acute distress, pleasant BF  SKIN: No diaphoresis rash HEENT: Unremarkable RESPIRATORY: Breathing is even, unlabored. Lung sounds are clear   CARDIOVASCULAR: Heart RRR no murmurs, rubs or gallops. No peripheral edema  GASTROINTESTINAL: Abdomen is soft, non-tender, not distended w/ normal bowel sounds.  GENITOURINARY: Bladder non tender, not distended  MUSCULOSKELETAL: No abnormal joints or musculature NEUROLOGIC: Cranial nerves 2-12 grossly intact.  Moves all extremities no tremor. PSYCHIATRIC: Mood and affect appropriate to situation, no behavioral issues  Patient Active Problem List   Diagnosis Date Noted  . Chronic renal disease, stage 2, mildly decreased glomerular filtration rate between 60-89 mL/min/1.73 square meter 10/14/2013  . Cough 06/29/2013  . Hypoglycemia 01/16/2013  . Psychosis 09/20/2012  . Type II or unspecified type diabetes mellitus without mention of complication, uncontrolled 08/19/2012  . Alzheimer's disease 08/19/2012  . Iron deficiency anemia, unspecified 08/19/2012  . Depressive disorder, not elsewhere classified 08/19/2012  . ADENOCARCINOMA, BREAST 06/30/2008  . ADENOCARCINOMA, OVARY 06/29/2008  . ANXIETY 01/16/2007    CBC    Component Value Date/Time   WBC 6.1 01/18/2013 0458   WBC 4.8 10/05/2008 1424   RBC 3.96 01/18/2013 0458   RBC 3.84 10/05/2008 1424   HGB 11.3* 01/18/2013 0458   HGB 11.5* 10/05/2008 1424   HCT 34.9* 01/18/2013 0458   HCT 34.6* 10/05/2008 1424   PLT 332 01/18/2013 0458   PLT 262 10/05/2008 1424   MCV 88.1 01/18/2013 0458   MCV 90.1 10/05/2008 1424   LYMPHSABS 1.9 01/16/2013 1145   LYMPHSABS 1.4 10/05/2008 1424   MONOABS 0.5 01/16/2013 1145   MONOABS 0.3 10/05/2008 1424   EOSABS 0.1 01/16/2013 1145   EOSABS 0.1 10/05/2008 1424   BASOSABS 0.0 01/16/2013 1145   BASOSABS 0.0 10/05/2008 1424    CMP     Component Value Date/Time   NA 139 01/18/2013 0458   K 4.2 01/18/2013 0458   CL 106 01/18/2013 0458   CO2 28 01/18/2013 0458   GLUCOSE 156* 01/18/2013 0458   BUN 13 01/18/2013 0458   CREATININE 0.86 01/18/2013 0458   CALCIUM 7.9* 01/18/2013 0458   PROT 6.0 01/17/2013 0504   ALBUMIN 2.5* 01/17/2013 0504   AST 11 01/17/2013 0504   ALT 7 01/17/2013 0504   ALKPHOS 72 01/17/2013 0504   BILITOT 0.2* 01/17/2013 0504   GFRNONAA 61* 01/18/2013 0458   GFRAA 70* 01/18/2013 0458    Assessment and Plan  Type II or unspecified type diabetes mellitus without mention of complication, uncontrolled 10/04/13  A1c 8.4 down from 9.2 in 06/2013;areview of BS from 7/17 -30 revealed majority of sugars were 150 - 250 range. There were 2 outliers , a 400 and 335 2 days in a row and 2 lows of 90, and 82 in am. There was no detectable patten of lows and highs, readings were randomly every where. Two options - SSI at meals which is a lot of sticks or increase Januvia to 100 mg daily up from 50 mg. Her CrCl is >60 so she can tolerate it. Her last HbA1c was fine considering her age and comorbidities so I would opt over looser control vs multiple injections.  Iron deficiency anemia, unspecified Hb has been stable in  the mid 11 on no iron so no changes for now.  Chronic renal disease, stage 2, mildly decreased glomerular filtration rate between 60-89 mL/min/1.73 square meter Pt's calculated CrCl was >60  (63) and GFR was >60. Cr was 0.7.  Time spent with CBG log, chart and pt> 35 min  Hennie Duos, MD

## 2013-10-14 NOTE — Assessment & Plan Note (Signed)
Hb has been stable in the mid 11 on no iron so no changes for now.

## 2013-11-30 ENCOUNTER — Non-Acute Institutional Stay (SKILLED_NURSING_FACILITY): Payer: Medicare HMO | Admitting: Internal Medicine

## 2013-11-30 ENCOUNTER — Encounter: Payer: Self-pay | Admitting: Internal Medicine

## 2013-11-30 DIAGNOSIS — F29 Unspecified psychosis not due to a substance or known physiological condition: Secondary | ICD-10-CM

## 2013-11-30 DIAGNOSIS — F0280 Dementia in other diseases classified elsewhere without behavioral disturbance: Secondary | ICD-10-CM

## 2013-11-30 DIAGNOSIS — C569 Malignant neoplasm of unspecified ovary: Secondary | ICD-10-CM

## 2013-11-30 DIAGNOSIS — IMO0001 Reserved for inherently not codable concepts without codable children: Secondary | ICD-10-CM

## 2013-11-30 DIAGNOSIS — F411 Generalized anxiety disorder: Secondary | ICD-10-CM

## 2013-11-30 DIAGNOSIS — G309 Alzheimer's disease, unspecified: Secondary | ICD-10-CM

## 2013-11-30 DIAGNOSIS — D509 Iron deficiency anemia, unspecified: Secondary | ICD-10-CM

## 2013-11-30 DIAGNOSIS — E1165 Type 2 diabetes mellitus with hyperglycemia: Principal | ICD-10-CM

## 2013-11-30 DIAGNOSIS — C50919 Malignant neoplasm of unspecified site of unspecified female breast: Secondary | ICD-10-CM

## 2013-11-30 NOTE — Assessment & Plan Note (Signed)
No reported problems;pt in risperdol

## 2013-11-30 NOTE — Assessment & Plan Note (Signed)
5 yes ago-noted

## 2013-11-30 NOTE — Assessment & Plan Note (Signed)
5 years ago, noted

## 2013-11-30 NOTE — Assessment & Plan Note (Signed)
Pt doing well on all her meds; is able to be involved in activities, out of her room

## 2013-11-30 NOTE — Assessment & Plan Note (Signed)
A1c improved from 9.2 to 8.4;Now on Junuvia 100 mg-recheck A1c at 3 months, so 01/2014

## 2013-11-30 NOTE — Progress Notes (Signed)
MRN: [id number] Name: Jill Francis  Sex: female Age: 80 y.o. DOB: November 13, 1927  [place] #: [facility] [id number] Facility/Room: Level Of Care: SNF Provider: Inocencio Homes D Emergency Contacts: Extended Emergency Contact Information Primary Emergency Contact: [name] [place] of [place] Phone: [phone] Work Phone: [phone] Mobile Phone: [phone] Relation: Niece Secondary Emergency Contact: [place] of [place] Phone: [phone] Relation: None  Code Status: DNR  Allergies: Gabapentin; Iodine; and Sulfonamide derivatives  Chief Complaint  Patient presents with  . Medical Management of Chronic Issues    HPI: Patient is 80 y.o. female who is being seen for routine isues.  Past Medical History  Diagnosis Date  . Cancer     Skin cancer  . Diabetes mellitus   . Depression   . Asthma   . Alzheimer's disease 08/19/2012  . Type II or unspecified type diabetes mellitus without mention of complication, uncontrolled 08/19/2012    Past Surgical History  Procedure Laterality Date  . Knee surgery    . Skin cancer excision    . Back surgery    . Breast surgery  right partial mastectomy  06/2003      Medication List       This list is accurate as of: 11/30/13  2:10 PM.  Always use your most recent med list.               calcium-vitamin D 500-200 MG-UNIT per tablet  Commonly known as:  OSCAL WITH D  Take 1 tablet by mouth 3 (three) times daily.     donepezil 10 MG tablet  Commonly known as:  ARICEPT  Take 10 mg by mouth at bedtime.     insulin glargine 100 UNIT/ML injection  Commonly known as:  LANTUS  Inject 30 Units into the skin at bedtime.     LORazepam 0.5 MG tablet  Commonly known as:  ATIVAN  Place 0.5 mg onto the skin every 8 (eight) hours as needed for anxiety. GEL     memantine 10 MG tablet  Commonly known as:  NAMENDA  Take 10 mg by mouth 2 (two) times daily.     protein supplement Powd  Take 1 scoop by  mouth 2 (two) times daily.     risperiDONE 0.5 MG tablet  Commonly known as:  RISPERDAL  Take 0.5 mg by mouth at bedtime.     sitaGLIPtin 25 MG tablet  Commonly known as:  JANUVIA  Take 50 mg by mouth daily.        No orders of the defined types were placed in this encounter.    Immunization History  Administered Date(s) Administered  . Influenza Whole 12/27/2012  . Pneumococcal Polysaccharide-23 01/17/2013    History  Substance Use Topics  . Smoking status: Former Research scientist (life sciences)  . Smokeless tobacco: Not on file  . Alcohol Use: No    Review of Systems  DATA OBTAINED: from patient; no c/o GENERAL: Feels well no fevers, fatigue, appetite changes SKIN: No itching, rash HEENT: No complaint RESPIRATORY: No cough, wheezing, SOB CARDIAC: No chest pain, palpitations, lower extremity edema  GI: No abdominal pain, No N/V/D or constipation, No heartburn or reflux  GU: No dysuria, frequency or urgency, or incontinence  MUSCULOSKELETAL: No unrelieved bone/joint pain NEUROLOGIC: No headache, dizziness or focal weakness PSYCHIATRIC: No overt anxiety or sadness. Sleeps well.   Filed Vitals:   11/30/13 1353  BP: 95/56  Pulse: 54  Temp: 97.7 F (36.5 C)  Resp: 18  Physical Exam  GENERAL APPEARANCE: Alert, conversant. Appropriately groomed. No acute distress  SKIN: No diaphoresis rash HEENT: Unremarkable RESPIRATORY: Breathing is even, unlabored. Lung sounds are clear   CARDIOVASCULAR: Heart RRR no murmurs, rubs or gallops. No peripheral edema  GASTROINTESTINAL: Abdomen is soft, non-tender, not distended w/ normal bowel sounds.  GENITOURINARY: Bladder non tender, not distended  MUSCULOSKELETAL: No abnormal joints or musculature NEUROLOGIC: Cranial nerves 2-12 grossly intact. Moves all extremities no tremor. PSYCHIATRIC: Mood and affect appropriate to situation, no behavioral issues  Patient Active Problem List   Diagnosis Date Noted  . Chronic renal disease, stage 2, mildly  decreased glomerular filtration rate between 60-89 mL/min/1.73 square meter 10/14/2013  . Cough 06/29/2013  . Hypoglycemia 01/16/2013  . Psychosis 09/20/2012  . Type II or unspecified type diabetes mellitus without mention of complication, uncontrolled 08/19/2012  . Alzheimer's disease 08/19/2012  . Iron deficiency anemia, unspecified 08/19/2012  . Depressive disorder, not elsewhere classified 08/19/2012  . ADENOCARCINOMA, BREAST 06/30/2008  . ADENOCARCINOMA, OVARY 06/29/2008  . ANXIETY 01/16/2007    CBC    Component Value Date/Time   WBC 6.1 01/18/2013 0458   WBC 4.8 10/05/2008 1424   RBC 3.96 01/18/2013 0458   RBC 3.84 10/05/2008 1424   HGB 11.3* 01/18/2013 0458   HGB 11.5* 10/05/2008 1424   HCT 34.9* 01/18/2013 0458   HCT 34.6* 10/05/2008 1424   PLT 332 01/18/2013 0458   PLT 262 10/05/2008 1424   MCV 88.1 01/18/2013 0458   MCV 90.1 10/05/2008 1424   LYMPHSABS 1.9 01/16/2013 1145   LYMPHSABS 1.4 10/05/2008 1424   MONOABS 0.5 01/16/2013 1145   MONOABS 0.3 10/05/2008 1424   EOSABS 0.1 01/16/2013 1145   EOSABS 0.1 10/05/2008 1424   BASOSABS 0.0 01/16/2013 1145   BASOSABS 0.0 10/05/2008 1424    CMP     Component Value Date/Time   NA 139 01/18/2013 0458   K 4.2 01/18/2013 0458   CL 106 01/18/2013 0458   CO2 28 01/18/2013 0458   GLUCOSE 156* 01/18/2013 0458   BUN 13 01/18/2013 0458   CREATININE 0.86 01/18/2013 0458   CALCIUM 7.9* 01/18/2013 0458   PROT 6.0 01/17/2013 0504   ALBUMIN 2.5* 01/17/2013 0504   AST 11 01/17/2013 0504   ALT 7 01/17/2013 0504   ALKPHOS 72 01/17/2013 0504   BILITOT 0.2* 01/17/2013 0504   GFRNONAA 61* 01/18/2013 0458   GFRAA 70* 01/18/2013 0458    Assessment and Plan  Type II or unspecified type diabetes mellitus without mention of complication, uncontrolled A1c improved from 9.2 to 8.4;Now on Junuvia 100 mg-recheck A1c at 3 months, so 01/2014  Alzheimer's disease Pt doing well on all her meds; is able to be involved in activities, out of her room  ADENOCARCINOMA,  OVARY 5 yes ago-noted  ADENOCARCINOMA, BREAST 5 years ago, noted  ANXIETY Continue ativan prn  Iron deficiency anemia, unspecified No changes  Psychosis No reported problems;pt in risperdol    Hennie Duos, MD

## 2013-11-30 NOTE — Assessment & Plan Note (Signed)
Continue ativan prn  

## 2013-11-30 NOTE — Assessment & Plan Note (Signed)
No changes

## 2013-12-16 ENCOUNTER — Non-Acute Institutional Stay (SKILLED_NURSING_FACILITY): Payer: Medicare HMO | Admitting: Internal Medicine

## 2013-12-16 DIAGNOSIS — G309 Alzheimer's disease, unspecified: Secondary | ICD-10-CM

## 2013-12-16 DIAGNOSIS — F0280 Dementia in other diseases classified elsewhere without behavioral disturbance: Secondary | ICD-10-CM

## 2013-12-16 DIAGNOSIS — E1165 Type 2 diabetes mellitus with hyperglycemia: Secondary | ICD-10-CM

## 2013-12-16 DIAGNOSIS — IMO0001 Reserved for inherently not codable concepts without codable children: Secondary | ICD-10-CM

## 2013-12-16 NOTE — Progress Notes (Signed)
MRN: [id number] Name: Jill Francis  Sex: female Age: 80 y.o. DOB: 04-22-1927  [place] #: [facility] Facility/Room: Level Of Care: SNF Provider: Inocencio Homes D Emergency Contacts: Extended Emergency Contact Information Primary Emergency Contact: [name] [place] of [place] Phone: [phone] Work Phone: [phone] Mobile Phone: [phone] Relation: Niece Secondary Emergency Contact: [place] Phone: [phone] Relation: None  Code Status: DNR  Allergies: Gabapentin; Iodine; and Sulfonamide derivatives  Chief Complaint  Patient presents with  . Medical Management of Chronic Issues    HPI: Patient is 80 y.o. female who is being seen because her BS are running high at night.  Past Medical History  Diagnosis Date  . Cancer     Skin cancer  . Diabetes mellitus   . Depression   . Asthma   . Alzheimer's disease 08/19/2012  . Type II or unspecified type diabetes mellitus without mention of complication, uncontrolled 08/19/2012    Past Surgical History  Procedure Laterality Date  . Knee surgery    . Skin cancer excision    . Back surgery    . Breast surgery  right partial mastectomy  06/2003      Medication List       This list is accurate as of: 12/16/13 11:59 PM.  Always use your most recent med list.               calcium-vitamin D 500-200 MG-UNIT per tablet  Commonly known as:  OSCAL WITH D  Take 1 tablet by mouth 3 (three) times daily.     donepezil 10 MG tablet  Commonly known as:  ARICEPT  Take 10 mg by mouth at bedtime.     insulin glargine 100 UNIT/ML injection  Commonly known as:  LANTUS  Inject 30 Units into the skin at bedtime.     LORazepam 0.5 MG tablet  Commonly known as:  ATIVAN  Place 0.5 mg onto the skin every 8 (eight) hours as needed for anxiety. GEL     memantine 10 MG tablet  Commonly known as:  NAMENDA  Take 10 mg by mouth 2 (two) times daily.     protein supplement Powd   Take 1 scoop by mouth 2 (two) times daily.     risperiDONE 0.5 MG tablet  Commonly known as:  RISPERDAL  Take 0.5 mg by mouth at bedtime.     sitaGLIPtin 25 MG tablet  Commonly known as:  JANUVIA  Take 50 mg by mouth daily.        No orders of the defined types were placed in this encounter.    Immunization History  Administered Date(s) Administered  . Influenza Whole 12/27/2012  . Pneumococcal Polysaccharide-23 01/17/2013    History  Substance Use Topics  . Smoking status: Former Research scientist (life sciences)  . Smokeless tobacco: Not on file  . Alcohol Use: No    Review of Systems  DATA OBTAINED: from patient; no c/o GENERAL:  no fevers, fatigue, appetite changes SKIN: No itching, rash HEENT: No complaint RESPIRATORY: No cough, wheezing, SOB CARDIAC: No chest pain, palpitations, lower extremity edema  GI: No abdominal pain, No N/V/D or constipation, No heartburn or reflux  GU: No dysuria, frequency or urgency, or incontinence  MUSCULOSKELETAL: No unrelieved bone/joint pain NEUROLOGIC: No headache, dizziness  PSYCHIATRIC: No overt anxiety or sadness  Filed Vitals:   12/16/13 1340  BP: 107/42  Pulse: 62  Temp: 98 F (36.7 C)  Resp: 18    Physical Exam  GENERAL APPEARANCE: Alert, modconversant, No acute distress  SKIN: No diaphoresis rash HEENT: Unremarkable RESPIRATORY: Breathing is even, unlabored. Lung sounds are clear   CARDIOVASCULAR: Heart RRR no murmurs, rubs or gallops. No peripheral edema  GASTROINTESTINAL: Abdomen is soft, non-tender, not distended w/ normal bowel sounds.  GENITOURINARY: Bladder non tender, not distended  MUSCULOSKELETAL: No abnormal joints or musculature NEUROLOGIC: Cranial nerves 2-12 grossly intact. PSYCHIATRIC: Mood and affect appropriate to situation, no behavioral issues  Patient Active Problem List   Diagnosis Date Noted  . Chronic renal disease, stage 2, mildly decreased glomerular filtration rate between 60-89 mL/min/1.73 square meter  10/14/2013  . Cough 06/29/2013  . Hypoglycemia 01/16/2013  . Psychosis 09/20/2012  . Diabetes mellitus type 2, uncontrolled, without complications 95/63/8756  . Alzheimer's disease 08/19/2012  . Iron deficiency anemia, unspecified 08/19/2012  . Depressive disorder, not elsewhere classified 08/19/2012  . ADENOCARCINOMA, BREAST 06/30/2008  . ADENOCARCINOMA, OVARY 06/29/2008  . ANXIETY 01/16/2007    CBC    Component Value Date/Time   WBC 6.1 01/18/2013 0458   WBC 4.8 10/05/2008 1424   RBC 3.96 01/18/2013 0458   RBC 3.84 10/05/2008 1424   HGB 11.3* 01/18/2013 0458   HGB 11.5* 10/05/2008 1424   HCT 34.9* 01/18/2013 0458   HCT 34.6* 10/05/2008 1424   PLT 332 01/18/2013 0458   PLT 262 10/05/2008 1424   MCV 88.1 01/18/2013 0458   MCV 90.1 10/05/2008 1424   LYMPHSABS 1.9 01/16/2013 1145   LYMPHSABS 1.4 10/05/2008 1424   MONOABS 0.5 01/16/2013 1145   MONOABS 0.3 10/05/2008 1424   EOSABS 0.1 01/16/2013 1145   EOSABS 0.1 10/05/2008 1424   BASOSABS 0.0 01/16/2013 1145   BASOSABS 0.0 10/05/2008 1424    CMP     Component Value Date/Time   NA 139 01/18/2013 0458   K 4.2 01/18/2013 0458   CL 106 01/18/2013 0458   CO2 28 01/18/2013 0458   GLUCOSE 156* 01/18/2013 0458   BUN 13 01/18/2013 0458   CREATININE 0.86 01/18/2013 0458   CALCIUM 7.9* 01/18/2013 0458   PROT 6.0 01/17/2013 0504   ALBUMIN 2.5* 01/17/2013 0504   AST 11 01/17/2013 0504   ALT 7 01/17/2013 0504   ALKPHOS 72 01/17/2013 0504   BILITOT 0.2* 01/17/2013 0504   GFRNONAA 61* 01/18/2013 0458   GFRAA 70* 01/18/2013 0458    Assessment and Plan  Diabetes mellitus type 2, uncontrolled, without complications Review of 2 weeks of BS; at roughly 10 pm ran from 164 to 480 ; HOWEVER, the next am BS were reliably between 120 - 150, with one low of 65. Dinner BS did not predict the pm BS so no help there. Any treatment of PM sugars would impact on am BS which are fine and in a safe range. Treating PM blood sugars could cause a very unsafe drop in am sugars.  Will continue to monitor BS and rethin it for any new trends.    Hennie Duos, MD

## 2013-12-18 ENCOUNTER — Encounter: Payer: Self-pay | Admitting: Internal Medicine

## 2013-12-18 NOTE — Assessment & Plan Note (Signed)
Review of 2 weeks of BS; at roughly 10 pm ran from 164 to 480 ; HOWEVER, the next am BS were reliably between 120 - 150, with one low of 65. Dinner BS did not predict the pm BS so no help there. Any treatment of PM sugars would impact on am BS which are fine and in a safe range. Treating PM blood sugars could cause a very unsafe drop in am sugars. Will continue to monitor BS and rethin it for any new trends.

## 2013-12-18 NOTE — Assessment & Plan Note (Signed)
Not end stage but still impacts on ability/reliability to detect/report if  BS are too low; therefore a need to keep am BS in the safe range it is now.

## 2014-01-17 LAB — LIPID PANEL
Cholesterol: 139 mg/dL (ref 0–200)
HDL: 34 mg/dL — AB (ref 35–70)
LDL Cholesterol: 87 mg/dL
Triglycerides: 90 mg/dL (ref 40–160)

## 2014-01-17 LAB — HEMOGLOBIN A1C: HEMOGLOBIN A1C: 7.3 % — AB (ref 4.0–6.0)

## 2014-03-15 ENCOUNTER — Non-Acute Institutional Stay (SKILLED_NURSING_FACILITY): Payer: Medicare HMO | Admitting: Internal Medicine

## 2014-03-15 ENCOUNTER — Encounter: Payer: Self-pay | Admitting: Internal Medicine

## 2014-03-15 DIAGNOSIS — F0280 Dementia in other diseases classified elsewhere without behavioral disturbance: Secondary | ICD-10-CM

## 2014-03-15 DIAGNOSIS — G309 Alzheimer's disease, unspecified: Secondary | ICD-10-CM

## 2014-03-15 DIAGNOSIS — E1165 Type 2 diabetes mellitus with hyperglycemia: Secondary | ICD-10-CM

## 2014-03-15 DIAGNOSIS — F29 Unspecified psychosis not due to a substance or known physiological condition: Secondary | ICD-10-CM

## 2014-03-15 DIAGNOSIS — IMO0001 Reserved for inherently not codable concepts without codable children: Secondary | ICD-10-CM

## 2014-03-15 DIAGNOSIS — E119 Type 2 diabetes mellitus without complications: Secondary | ICD-10-CM

## 2014-03-15 DIAGNOSIS — F32A Depression, unspecified: Secondary | ICD-10-CM

## 2014-03-15 DIAGNOSIS — F329 Major depressive disorder, single episode, unspecified: Secondary | ICD-10-CM

## 2014-03-15 DIAGNOSIS — E785 Hyperlipidemia, unspecified: Secondary | ICD-10-CM

## 2014-03-15 NOTE — Assessment & Plan Note (Signed)
On risperdol;no reported problems

## 2014-03-15 NOTE — Assessment & Plan Note (Signed)
Recent A1c was 7.3 so will maintain current regimen

## 2014-03-15 NOTE — Assessment & Plan Note (Signed)
A1c was 7.3 which is very acceptable control

## 2014-03-15 NOTE — Assessment & Plan Note (Signed)
Pt with HDL 34 and LDL 87 on no meds and I think its OK to not add more medication

## 2014-03-15 NOTE — Assessment & Plan Note (Signed)
Remeron 15 mg started qHS by psych

## 2014-03-15 NOTE — Assessment & Plan Note (Signed)
Pt can do a lot for herself;is hard of hearing but understands a lot;consider aricept and namenda;

## 2014-03-15 NOTE — Progress Notes (Signed)
MRN: [id number] Name: Jill Francis  Sex: female Age: 80 y.o. DOB: [birth year]  [place] #: [facility] Facility/Room: 216 Level Of Care: SNF Provider: Inocencio Homes D Emergency Contacts: Extended Emergency Contact Information Primary Emergency Contact: [name] [place] of [place] Phone: [phone] Work Phone: [phone] Mobile Phone: [phone] Relation: Niece Secondary Emergency Contact: [place] of [place] Phone: [phone] Relation: None  Code Status:DNR   Allergies: Gabapentin; Iodine; and Sulfonamide derivatives  Chief Complaint  Patient presents with  . Medical Management of Chronic Issues    HPI: Patient is 80 y.o. female who is being seen for routine issues.  Past Medical History  Diagnosis Date  . Cancer     Skin cancer  . Diabetes mellitus   . Depression   . Asthma   . Alzheimer's disease 08/19/2012  . Type II or unspecified type diabetes mellitus without mention of complication, uncontrolled 08/19/2012  . Hyperlipidemia     Past Surgical History  Procedure Laterality Date  . Knee surgery    . Skin cancer excision    . Back surgery    . Breast surgery  right partial mastectomy  06/2003      Medication List       This list is accurate as of: 03/15/14  7:19 PM.  Always use your most recent med list.               calcium-vitamin D 500-200 MG-UNIT per tablet  Commonly known as:  OSCAL WITH D  Take 1 tablet by mouth 3 (three) times daily.     donepezil 10 MG tablet  Commonly known as:  ARICEPT  Take 10 mg by mouth at bedtime.     insulin glargine 100 UNIT/ML injection  Commonly known as:  LANTUS  Inject 30 Units into the skin at bedtime.     LORazepam 0.5 MG tablet  Commonly known as:  ATIVAN  Place 0.5 mg onto the skin every 8 (eight) hours as needed for anxiety. GEL     memantine 10 MG tablet  Commonly known as:  NAMENDA  Take 10 mg by mouth 2 (two) times daily.     protein supplement  Powd  Take 1 scoop by mouth 2 (two) times daily.     risperiDONE 0.5 MG tablet  Commonly known as:  RISPERDAL  Take 0.5 mg by mouth at bedtime.     sitaGLIPtin 25 MG tablet  Commonly known as:  JANUVIA  Take 50 mg by mouth daily.        No orders of the defined types were placed in this encounter.    Immunization History  Administered Date(s) Administered  . Influenza Whole 12/27/2012  . Influenza-Unspecified 12/27/2013  . Pneumococcal Polysaccharide-23 01/17/2013    History  Substance Use Topics  . Smoking status: Former Research scientist (life sciences)  . Smokeless tobacco: Not on file  . Alcohol Use: No    Review of Systems  DATA OBTAINED: from patient; no c/o ;HOH GENERAL:  no fevers, fatigue, appetite changes SKIN: No itching, rash HEENT: No complaint RESPIRATORY: No cough, wheezing, SOB CARDIAC: No chest pain, palpitations, lower extremity edema  GI: No abdominal pain, No N/V/D or constipation, No heartburn or reflux  GU: No dysuria, frequency or urgency, or incontinence  MUSCULOSKELETAL: No unrelieved bone/joint pain NEUROLOGIC: No headache, dizziness  PSYCHIATRIC: No overt anxiety or sadness  Filed Vitals:   03/15/14 1557  BP: 107/64  Pulse: 56  Temp: 97.4 F (36.3 C)  Resp:  18    Physical Exam  GENERAL APPEARANCE: Alert, conversant, No acute distress  SKIN: No diaphoresis rash HEENT: Unremarkable RESPIRATORY: Breathing is even, unlabored. Lung sounds are clear   CARDIOVASCULAR: Heart RRR no murmurs, rubs or gallops. No peripheral edema  GASTROINTESTINAL: Abdomen is soft, non-tender, not distended w/ normal bowel sounds.  GENITOURINARY: Bladder non tender, not distended  MUSCULOSKELETAL: No abnormal joints or musculature NEUROLOGIC: Cranial nerves 2-12 grossly intact. Moves all extremities PSYCHIATRIC: dementia and HOH but can carry on some conversation  Patient Active Problem List   Diagnosis Date Noted  . Type 2 diabetes mellitus without complication 62/13/0865   . Hyperlipidemia   . Chronic renal disease, stage 2, mildly decreased glomerular filtration rate between 60-89 mL/min/1.73 square meter 10/14/2013  . Cough 06/29/2013  . Hypoglycemia 01/16/2013  . Psychosis 09/20/2012  . Diabetes mellitus type 2, uncontrolled, without complications 78/46/9629  . Alzheimer's disease 08/19/2012  . Iron deficiency anemia, unspecified 08/19/2012  . Depression 08/19/2012  . ADENOCARCINOMA, BREAST 06/30/2008  . ADENOCARCINOMA, OVARY 06/29/2008  . ANXIETY 01/16/2007    CBC    Component Value Date/Time   WBC 6.1 01/18/2013 0458   WBC 4.8 10/05/2008 1424   RBC 3.96 01/18/2013 0458   RBC 3.84 10/05/2008 1424   HGB 11.3* 01/18/2013 0458   HGB 11.5* 10/05/2008 1424   HCT 34.9* 01/18/2013 0458   HCT 34.6* 10/05/2008 1424   PLT 332 01/18/2013 0458   PLT 262 10/05/2008 1424   MCV 88.1 01/18/2013 0458   MCV 90.1 10/05/2008 1424   LYMPHSABS 1.9 01/16/2013 1145   LYMPHSABS 1.4 10/05/2008 1424   MONOABS 0.5 01/16/2013 1145   MONOABS 0.3 10/05/2008 1424   EOSABS 0.1 01/16/2013 1145   EOSABS 0.1 10/05/2008 1424   BASOSABS 0.0 01/16/2013 1145   BASOSABS 0.0 10/05/2008 1424    CMP     Component Value Date/Time   NA 139 01/18/2013 0458   K 4.2 01/18/2013 0458   CL 106 01/18/2013 0458   CO2 28 01/18/2013 0458   GLUCOSE 156* 01/18/2013 0458   BUN 13 01/18/2013 0458   CREATININE 0.86 01/18/2013 0458   CALCIUM 7.9* 01/18/2013 0458   PROT 6.0 01/17/2013 0504   ALBUMIN 2.5* 01/17/2013 0504   AST 11 01/17/2013 0504   ALT 7 01/17/2013 0504   ALKPHOS 72 01/17/2013 0504   BILITOT 0.2* 01/17/2013 0504   GFRNONAA 61* 01/18/2013 0458   GFRAA 70* 01/18/2013 0458    Assessment and Plan  Alzheimer's disease Pt can do a lot for herself;is hard of hearing but understands a lot;consider aricept and namenda;  Psychosis On risperdol;no reported problems  Type 2 diabetes mellitus without complication B2W was 7.3 which is very acceptable control  Diabetes  mellitus type 2, uncontrolled, without complications Recent U1L was 7.3 so will maintain current regimen  Hyperlipidemia Pt with HDL 34 and LDL 87 on no meds and I think its OK to not add more medication  Depression Remeron 15 mg started qHS by psych    Hennie Duos, MD

## 2014-04-26 ENCOUNTER — Encounter: Payer: Self-pay | Admitting: Internal Medicine

## 2014-04-26 ENCOUNTER — Non-Acute Institutional Stay (SKILLED_NURSING_FACILITY): Payer: Medicare HMO | Admitting: Internal Medicine

## 2014-04-26 DIAGNOSIS — F29 Unspecified psychosis not due to a substance or known physiological condition: Secondary | ICD-10-CM

## 2014-04-26 DIAGNOSIS — R1311 Dysphagia, oral phase: Secondary | ICD-10-CM | POA: Insufficient documentation

## 2014-04-26 DIAGNOSIS — E119 Type 2 diabetes mellitus without complications: Secondary | ICD-10-CM

## 2014-04-26 DIAGNOSIS — F32A Depression, unspecified: Secondary | ICD-10-CM

## 2014-04-26 DIAGNOSIS — D509 Iron deficiency anemia, unspecified: Secondary | ICD-10-CM

## 2014-04-26 DIAGNOSIS — F03918 Unspecified dementia, unspecified severity, with other behavioral disturbance: Secondary | ICD-10-CM

## 2014-04-26 DIAGNOSIS — F329 Major depressive disorder, single episode, unspecified: Secondary | ICD-10-CM

## 2014-04-26 DIAGNOSIS — F0391 Unspecified dementia with behavioral disturbance: Secondary | ICD-10-CM

## 2014-04-26 DIAGNOSIS — C50919 Malignant neoplasm of unspecified site of unspecified female breast: Secondary | ICD-10-CM

## 2014-04-26 DIAGNOSIS — N182 Chronic kidney disease, stage 2 (mild): Secondary | ICD-10-CM

## 2014-04-26 DIAGNOSIS — F411 Generalized anxiety disorder: Secondary | ICD-10-CM

## 2014-04-26 NOTE — Assessment & Plan Note (Signed)
Pt with psychosis as behovoirs, on risperdal for same. An aricept and namenda;staff report pt overall more cooperative, some improvement in agitation

## 2014-04-26 NOTE — Assessment & Plan Note (Signed)
On risperdol; no reported psychotic episodes per se, pt does get aggitated at times

## 2014-04-26 NOTE — Progress Notes (Signed)
MRN: [id number] Name: Jill Francis  Sex: female Age: 81 y.o. DOB: 02-21-1928  [place] #: [facility] Facility/Room: 216W Level Of Care: SNF Provider: Inocencio Homes D Emergency Contacts: Extended Emergency Contact Information Primary Emergency Contact: [name] [place] of [place] Phone: [phone] Work Phone: [phone] Mobile Phone: [phone] Relation: Niece Secondary Emergency Contact: [place] of [place] Phone: [phone] Relation: None  Code Status: DNR  Allergies: Gabapentin; Iodine; and Sulfonamide derivatives  Chief Complaint  Patient presents with  . Medical Management of Chronic Issues    HPI: Patient is 81 y.o. female who is being seen for routine issues.  Past Medical History  Diagnosis Date  . Cancer     Skin cancer  . Diabetes mellitus   . Depression   . Asthma   . Alzheimer's disease 08/19/2012  . Type II or unspecified type diabetes mellitus without mention of complication, uncontrolled 08/19/2012  . Hyperlipidemia     Past Surgical History  Procedure Laterality Date  . Knee surgery    . Skin cancer excision    . Back surgery    . Breast surgery  right partial mastectomy  06/2003      Medication List       This list is accurate as of: 04/26/14 12:21 PM.  Always use your most recent med list.               calcium-vitamin D 500-200 MG-UNIT per tablet  Commonly known as:  OSCAL WITH D  Take 1 tablet by mouth 3 (three) times daily.     donepezil 10 MG tablet  Commonly known as:  ARICEPT  Take 10 mg by mouth at bedtime.     escitalopram 10 MG tablet  Commonly known as:  LEXAPRO  Take 10 mg by mouth daily.     insulin glargine 100 UNIT/ML injection  Commonly known as:  LANTUS  Inject 30 Units into the skin at bedtime.     LORazepam 0.5 MG tablet  Commonly known as:  ATIVAN  Place 0.5 mg onto the skin every 8 (eight) hours as needed for anxiety. GEL     memantine 10 MG tablet   Commonly known as:  NAMENDA  Take 10 mg by mouth 2 (two) times daily.     mirtazapine 15 MG tablet  Commonly known as:  REMERON  Take 15 mg by mouth at bedtime.     protein supplement Powd  Take 1 scoop by mouth 2 (two) times daily.     risperiDONE 0.5 MG tablet  Commonly known as:  RISPERDAL  Take 0.5 mg by mouth at bedtime.     sitaGLIPtin 100 MG tablet  Commonly known as:  JANUVIA  Take 100 mg by mouth daily.        Meds ordered this encounter  Medications  . sitaGLIPtin (JANUVIA) 100 MG tablet    Sig: Take 100 mg by mouth daily.  Marland Kitchen escitalopram (LEXAPRO) 10 MG tablet    Sig: Take 10 mg by mouth daily.  . mirtazapine (REMERON) 15 MG tablet    Sig: Take 15 mg by mouth at bedtime.    Immunization History  Administered Date(s) Administered  . Influenza Whole 12/27/2012  . Influenza-Unspecified 12/27/2013  . Pneumococcal Polysaccharide-23 01/17/2013    History  Substance Use Topics  . Smoking status: Former Research scientist (life sciences)  . Smokeless tobacco: Not on file  . Alcohol Use: No    Review of Systems   UTO 2/2 dementia  Filed Vitals:   04/26/14 1058  BP: 107/64  Pulse: 56  Temp: 97.4 F (36.3 C)  Resp: 18    Physical Exam  GENERAL APPEARANCE: Alert, mod conversant, No acute distress ,Bf SKIN: No diaphoresis rash HEENT: Unremarkable RESPIRATORY: Breathing is even, unlabored. Lung sounds are clear   CARDIOVASCULAR: Heart RRR no murmurs, rubs or gallops. No peripheral edema  GASTROINTESTINAL: Abdomen is soft, non-tender, not distended w/ normal bowel sounds.  GENITOURINARY: Bladder non tender, not distended  MUSCULOSKELETAL: No abnormal joints or musculature NEUROLOGIC: Cranial nerves 2-12 grossly intact. Moves all extremities PSYCHIATRIC: odd affect, no behavioral issues  Patient Active Problem List   Diagnosis Date Noted  . Dysphagia, oral phase 04/26/2014  . Type 2 diabetes mellitus without complication 75/44/9201  . Hyperlipidemia   . Chronic renal  disease, stage 2, mildly decreased glomerular filtration rate between 60-89 mL/min/1.73 square meter 10/14/2013  . Cough 06/29/2013  . Hypoglycemia 01/16/2013  . Psychosis 09/20/2012  . Diabetes mellitus type 2, uncontrolled, without complications 00/71/2197  . Dementia with behavioral disturbance 08/19/2012  . Iron deficiency anemia 08/19/2012  . Depression 08/19/2012  . Malignant neoplasm of female breast 06/30/2008  . ADENOCARCINOMA, OVARY 06/29/2008  . Anxiety state 01/16/2007    CBC    Component Value Date/Time   WBC 6.1 01/18/2013 0458   WBC 4.8 10/05/2008 1424   RBC 3.96 01/18/2013 0458   RBC 3.84 10/05/2008 1424   HGB 11.3* 01/18/2013 0458   HGB 11.5* 10/05/2008 1424   HCT 34.9* 01/18/2013 0458   HCT 34.6* 10/05/2008 1424   PLT 332 01/18/2013 0458   PLT 262 10/05/2008 1424   MCV 88.1 01/18/2013 0458   MCV 90.1 10/05/2008 1424   LYMPHSABS 1.9 01/16/2013 1145   LYMPHSABS 1.4 10/05/2008 1424   MONOABS 0.5 01/16/2013 1145   MONOABS 0.3 10/05/2008 1424   EOSABS 0.1 01/16/2013 1145   EOSABS 0.1 10/05/2008 1424   BASOSABS 0.0 01/16/2013 1145   BASOSABS 0.0 10/05/2008 1424    CMP     Component Value Date/Time   NA 139 01/18/2013 0458   K 4.2 01/18/2013 0458   CL 106 01/18/2013 0458   CO2 28 01/18/2013 0458   GLUCOSE 156* 01/18/2013 0458   BUN 13 01/18/2013 0458   CREATININE 0.86 01/18/2013 0458   CALCIUM 7.9* 01/18/2013 0458   PROT 6.0 01/17/2013 0504   ALBUMIN 2.5* 01/17/2013 0504   AST 11 01/17/2013 0504   ALT 7 01/17/2013 0504   ALKPHOS 72 01/17/2013 0504   BILITOT 0.2* 01/17/2013 0504   GFRNONAA 61* 01/18/2013 0458   GFRAA 70* 01/18/2013 0458    Assessment and Plan  Dementia with behavioral disturbance Pt with psychosis as behovoirs, on risperdal for same. An aricept and namenda;staff report pt overall more cooperative, some improvement in agitation   Depression Pt has been on Laxapro and remeron was added with improved affect   Anxiety  state Lexapro helps with anxiety and pt has ativan gel for outburst and agitation   Psychosis On risperdol; no reported psychotic episodes per se, pt does get aggitated at times   Type 2 diabetes mellitus without complication Pt on Januvi and insulin at night; A1C in 01/2014 was 7.3, not due another lab for several months   Iron deficiency anemia Last Hb was 11.1 in 09/2013 which was stable from prior; pt is due CBC, will order   Malignant neoplasm of female breast Noted, 5 years ago, presume cure   Dysphagia, oral phase Tolerating mechanical soft,  pureed meats, thin liquid diet     Hennie Duos, MD

## 2014-04-26 NOTE — Assessment & Plan Note (Signed)
Last Hb was 11.1 in 09/2013 which was stable from prior; pt is due CBC, will order

## 2014-04-26 NOTE — Assessment & Plan Note (Signed)
Pt has been on Laxapro and remeron was added with improved affect

## 2014-04-26 NOTE — Assessment & Plan Note (Signed)
Lexapro helps with anxiety and pt has ativan gel for outburst and agitation

## 2014-04-26 NOTE — Assessment & Plan Note (Signed)
Tolerating mechanical soft, pureed meats, thin liquid diet

## 2014-04-26 NOTE — Assessment & Plan Note (Signed)
Pt on Januvi and insulin at night; A1C in 01/2014 was 7.3, not due another lab for several months

## 2014-04-26 NOTE — Assessment & Plan Note (Signed)
Noted, 5 years ago, presume cure

## 2014-04-27 LAB — HEPATIC FUNCTION PANEL
ALK PHOS: 57 U/L (ref 25–125)
ALT: 5 U/L — AB (ref 7–35)
AST: 9 U/L — AB (ref 13–35)
BILIRUBIN, TOTAL: 0.3 mg/dL

## 2014-04-27 LAB — BASIC METABOLIC PANEL
BUN: 10 mg/dL (ref 4–21)
GLUCOSE: 77 mg/dL
POTASSIUM: 3.7 mmol/L (ref 3.4–5.3)
SODIUM: 140 mmol/L (ref 137–147)

## 2014-04-27 LAB — CBC AND DIFFERENTIAL
HCT: 35 % — AB (ref 36–46)
Hemoglobin: 11.2 g/dL — AB (ref 12.0–16.0)
PLATELETS: 273 10*3/uL (ref 150–399)
WBC: 5.9 10*3/mL

## 2014-05-26 ENCOUNTER — Non-Acute Institutional Stay (SKILLED_NURSING_FACILITY): Payer: Medicare HMO | Admitting: Internal Medicine

## 2014-05-26 DIAGNOSIS — F0391 Unspecified dementia with behavioral disturbance: Secondary | ICD-10-CM

## 2014-05-26 DIAGNOSIS — N182 Chronic kidney disease, stage 2 (mild): Secondary | ICD-10-CM

## 2014-05-26 DIAGNOSIS — D509 Iron deficiency anemia, unspecified: Secondary | ICD-10-CM | POA: Diagnosis not present

## 2014-05-26 DIAGNOSIS — R627 Adult failure to thrive: Secondary | ICD-10-CM | POA: Diagnosis not present

## 2014-05-26 DIAGNOSIS — F03918 Unspecified dementia, unspecified severity, with other behavioral disturbance: Secondary | ICD-10-CM

## 2014-05-26 NOTE — Progress Notes (Signed)
MRN: [id number] Name: Jill Francis  Sex: female Age: 81 y.o. DOB: 01/26/1928  [place] #: [facility] Facility/Room:216 Level Of Care: SNF Provider: Inocencio Homes D Emergency Contacts: Extended Emergency Contact Information Primary Emergency Contact: [name] [place] of [place] Phone: [phone] Work Phone: [phone] Mobile Phone: [phone] Relation: Niece Secondary Emergency Contact: [place] Phone: [phone] Relation: None  Code Status: DNR  Allergies: Gabapentin; Iodine; and Sulfonamide derivatives  Chief Complaint  Patient presents with  . Medical Management of Chronic Issues    HPI: Patient is 81 y.o. female who is being seen for routine issues.  Past Medical History  Diagnosis Date  . Cancer     Skin cancer  . Diabetes mellitus   . Depression   . Asthma   . Alzheimer's disease 08/19/2012  . Type II or unspecified type diabetes mellitus without mention of complication, uncontrolled 08/19/2012  . Hyperlipidemia     Past Surgical History  Procedure Laterality Date  . Knee surgery    . Skin cancer excision    . Back surgery    . Breast surgery  right partial mastectomy  06/2003      Medication List       This list is accurate as of: 05/26/14 11:59 PM.  Always use your most recent med list.               calcium-vitamin D 500-200 MG-UNIT per tablet  Commonly known as:  OSCAL WITH D  Take 1 tablet by mouth 3 (three) times daily.     donepezil 10 MG tablet  Commonly known as:  ARICEPT  Take 10 mg by mouth at bedtime.     escitalopram 10 MG tablet  Commonly known as:  LEXAPRO  Take 10 mg by mouth daily.     insulin glargine 100 UNIT/ML injection  Commonly known as:  LANTUS  Inject 30 Units into the skin at bedtime.     LORazepam 0.5 MG tablet  Commonly known as:  ATIVAN  Place 0.5 mg onto the skin every 8 (eight) hours as needed for anxiety. GEL     memantine 10 MG tablet  Commonly  known as:  NAMENDA  Take 10 mg by mouth 2 (two) times daily.     mirtazapine 15 MG tablet  Commonly known as:  REMERON  Take 15 mg by mouth at bedtime.     protein supplement Powd  Take 1 scoop by mouth 2 (two) times daily.     risperiDONE 0.5 MG tablet  Commonly known as:  RISPERDAL  Take 0.5 mg by mouth at bedtime.     sitaGLIPtin 100 MG tablet  Commonly known as:  JANUVIA  Take 100 mg by mouth daily.        No orders of the defined types were placed in this encounter.    Immunization History  Administered Date(s) Administered  . Influenza Whole 12/27/2012  . Influenza-Unspecified 12/27/2013  . Pneumococcal Polysaccharide-23 01/17/2013    History  Substance Use Topics  . Smoking status: Former Research scientist (life sciences)  . Smokeless tobacco: Not on file  . Alcohol Use: No    Review of Systems  DATA OBTAINED: from patient who is HOH; no c/o GENERAL:  no fevers, fatigue, appetite changes SKIN: No itching, rash HEENT: No complaint RESPIRATORY: No cough, wheezing, SOB CARDIAC: No chest pain, palpitations, lower extremity edema  GI: No abdominal pain, No N/V/D or constipation, No heartburn or reflux  GU: No dysuria,  frequency or urgency, or incontinence  MUSCULOSKELETAL: No unrelieved bone/joint pain NEUROLOGIC: No headache, dizziness  PSYCHIATRIC: No overt anxiety or sadness  There were no vitals filed for this visit.  Physical Exam  GENERAL APPEARANCE: Alert, conversant, No acute distress ;HOH SKIN: No diaphoresis rash, or wounds HEENT: Unremarkable RESPIRATORY: Breathing is even, unlabored. Lung sounds are clear   CARDIOVASCULAR: Heart RRR no murmurs, rubs or gallops. No peripheral edema  GASTROINTESTINAL: Abdomen is soft, non-tender, not distended w/ normal bowel sounds.  GENITOURINARY: Bladder non tender, not distended  MUSCULOSKELETAL: No abnormal joints or musculature NEUROLOGIC: Cranial nerves 2-12 grossly intact. Moves all extremities PSYCHIATRIC: dementia, no  behavioral issues today  Patient Active Problem List   Diagnosis Date Noted  . Hypocalcemia 05/27/2014  . FTT (failure to thrive) in adult 05/27/2014  . Dysphagia, oral phase 04/26/2014  . CKD (chronic kidney disease) stage 2, GFR 60-89 ml/min 04/26/2014  . Type 2 diabetes mellitus without complication 78/29/5621  . Hyperlipidemia   . Chronic renal disease, stage 2, mildly decreased glomerular filtration rate between 60-89 mL/min/1.73 square meter 10/14/2013  . Cough 06/29/2013  . Hypoglycemia 01/16/2013  . Psychosis 09/20/2012  . Diabetes mellitus type 2, uncontrolled, without complications 30/86/5784  . Dementia with behavioral disturbance 08/19/2012  . Iron deficiency anemia 08/19/2012  . Depression 08/19/2012  . Malignant neoplasm of female breast 06/30/2008  . ADENOCARCINOMA, OVARY 06/29/2008  . Anxiety state 01/16/2007    CBC    Component Value Date/Time   WBC 6.1 01/18/2013 0458   WBC 4.8 10/05/2008 1424   RBC 3.96 01/18/2013 0458   RBC 3.84 10/05/2008 1424   HGB 11.3* 01/18/2013 0458   HGB 11.5* 10/05/2008 1424   HCT 34.9* 01/18/2013 0458   HCT 34.6* 10/05/2008 1424   PLT 332 01/18/2013 0458   PLT 262 10/05/2008 1424   MCV 88.1 01/18/2013 0458   MCV 90.1 10/05/2008 1424   LYMPHSABS 1.9 01/16/2013 1145   LYMPHSABS 1.4 10/05/2008 1424   MONOABS 0.5 01/16/2013 1145   MONOABS 0.3 10/05/2008 1424   EOSABS 0.1 01/16/2013 1145   EOSABS 0.1 10/05/2008 1424   BASOSABS 0.0 01/16/2013 1145   BASOSABS 0.0 10/05/2008 1424    CMP     Component Value Date/Time   NA 139 01/18/2013 0458   K 4.2 01/18/2013 0458   CL 106 01/18/2013 0458   CO2 28 01/18/2013 0458   GLUCOSE 156* 01/18/2013 0458   BUN 13 01/18/2013 0458   CREATININE 0.86 01/18/2013 0458   CALCIUM 7.9* 01/18/2013 0458   PROT 6.0 01/17/2013 0504   ALBUMIN 2.5* 01/17/2013 0504   AST 11 01/17/2013 0504   ALT 7 01/17/2013 0504   ALKPHOS 72 01/17/2013 0504   BILITOT 0.2* 01/17/2013 0504   GFRNONAA 61*  01/18/2013 0458   GFRAA 70* 01/18/2013 0458    Assessment and Plan  Hypocalcemia 2/11 Ca+ was 7.5, this past summer 7.1; pt is on calcium supplement;Plan-continue oscal-D 5000mg  poTID   Iron deficiency anemia On 2/11 Hb 11.2/34.9 with iron level 41, %sat 118; Plan - start FeSo4 325 md daily and monitor   FTT (failure to thrive) in adult Manifesting as weight loss ,12 pounds in 30 days;pt has poor attention but will not let staff help with feeding; Plan - offer supplements and continue offering feeding help   Dementia with behavioral disturbance The underlying cause of FTT;pt is already on aricept and namenda so nothing left to offer; this trajectory is not unexpected;Plan - continue current meds  Chronic renal disease, stage 2, mildly decreased glomerular filtration rate between 60-89 mL/min/1.73 square meter Chronic and stable;In 04/27/2014 GFR> 60 , BUN 10/Cr 0.7; Plan -continue monitor and intervene when appropriate     Hennie Duos, MD

## 2014-05-27 ENCOUNTER — Encounter: Payer: Self-pay | Admitting: Internal Medicine

## 2014-05-27 DIAGNOSIS — R627 Adult failure to thrive: Secondary | ICD-10-CM | POA: Insufficient documentation

## 2014-05-27 NOTE — Assessment & Plan Note (Signed)
The underlying cause of FTT;pt is already on aricept and namenda so nothing left to offer; this trajectory is not unexpected;Plan - continue current meds

## 2014-05-27 NOTE — Assessment & Plan Note (Signed)
Chronic and stable;In 04/27/2014 GFR> 60 , BUN 10/Cr 0.7; Plan -continue monitor and intervene when appropriate

## 2014-05-27 NOTE — Assessment & Plan Note (Signed)
On 2/11 Hb 11.2/34.9 with iron level 41, %sat 118; Plan - start FeSo4 325 md daily and monitor

## 2014-05-27 NOTE — Assessment & Plan Note (Signed)
Manifesting as weight loss ,12 pounds in 30 days;pt has poor attention but will not let staff help with feeding; Plan - offer supplements and continue offering feeding help

## 2014-05-27 NOTE — Assessment & Plan Note (Signed)
2/11 Ca+ was 7.5, this past summer 7.1; pt is on calcium supplement;Plan-continue oscal-D 5000mg  poTID

## 2014-07-24 ENCOUNTER — Encounter: Payer: Self-pay | Admitting: *Deleted

## 2014-08-11 ENCOUNTER — Non-Acute Institutional Stay: Payer: Medicare HMO | Admitting: Internal Medicine

## 2014-08-11 ENCOUNTER — Encounter: Payer: Self-pay | Admitting: Internal Medicine

## 2014-08-11 DIAGNOSIS — F32A Depression, unspecified: Secondary | ICD-10-CM

## 2014-08-11 DIAGNOSIS — F329 Major depressive disorder, single episode, unspecified: Secondary | ICD-10-CM | POA: Diagnosis not present

## 2014-08-11 DIAGNOSIS — F411 Generalized anxiety disorder: Secondary | ICD-10-CM | POA: Diagnosis not present

## 2014-08-11 DIAGNOSIS — F29 Unspecified psychosis not due to a substance or known physiological condition: Secondary | ICD-10-CM | POA: Diagnosis not present

## 2014-08-11 NOTE — Progress Notes (Signed)
MRN: [id number] Name: Jill Francis  Sex: female Age: 81 y.o. DOB: April 20, 1927  [place] #: [facility] Facility/Room:216W Level Of Care: SNF Provider: Inocencio Homes D Emergency Contacts: Extended Emergency Contact Information Primary Emergency Contact: [name] [place] Phone: [phone] Work Phone: [phone] Mobile Phone: [phone] Relation: Niece Secondary Emergency Contact: [place] Phone: [phone] Relation: None  Code Status: DNR  Allergies: Gabapentin; Iodine; and Sulfonamide derivatives  Chief Complaint  Patient presents with  . Medical Management of Chronic Issues    HPI: Patient is 81 y.o. female who is being seen for routine. Pt had been stable without acute problems since last visit.  Past Medical History  Diagnosis Date  . Cancer     Skin cancer  . Diabetes mellitus   . Depression   . Asthma   . Alzheimer's disease 08/19/2012  . Type II or unspecified type diabetes mellitus without mention of complication, uncontrolled 08/19/2012  . Hyperlipidemia     Past Surgical History  Procedure Laterality Date  . Knee surgery    . Skin cancer excision    . Back surgery    . Breast surgery  right partial mastectomy  06/2003      Medication List       This list is accurate as of: 08/11/14  3:12 PM.  Always use your most recent med list.               calcium-vitamin D 500-200 MG-UNIT per tablet  Commonly known as:  OSCAL WITH D  Take 1 tablet by mouth 3 (three) times daily.     donepezil 10 MG tablet  Commonly known as:  ARICEPT  Take 10 mg by mouth at bedtime. For dementia     escitalopram 10 MG tablet  Commonly known as:  LEXAPRO  Take 10 mg by mouth daily. For depression     ferrous sulfate 325 (65 FE) MG tablet  Take 325 mg by mouth daily with breakfast.     insulin glargine 100 UNIT/ML injection  Commonly known as:  LANTUS  Inject 30 Units into the skin at bedtime. For  diabetes, hold insulin for CBG>120.     memantine 10 MG tablet  Commonly known as:  NAMENDA  Take 10 mg by mouth 2 (two) times daily. For dementia     mirtazapine 15 MG tablet  Commonly known as:  REMERON  Take 15 mg by mouth at bedtime.     protein supplement Powd  Take 1 scoop by mouth 2 (two) times daily.     risperiDONE 0.5 MG tablet  Commonly known as:  RISPERDAL  Take 0.5 mg by mouth at bedtime.     sitaGLIPtin 100 MG tablet  Commonly known as:  JANUVIA  Take 100 mg by mouth daily. For diabetes        No orders of the defined types were placed in this encounter.    Immunization History  Administered Date(s) Administered  . Influenza Whole 12/27/2012  . Influenza-Unspecified 12/27/2013  . PPD Test 06/05/2009  . Pneumococcal Polysaccharide-23 01/17/2013    History  Substance Use Topics  . Smoking status: Former Research scientist (life sciences)  . Smokeless tobacco: Not on file  . Alcohol Use: No    Review of Systems  DATA OBTAINED: from patient, nurse; no concerns voiced GENERAL:  no fevers, fatigue, appetite changes SKIN: No itching, rash HEENT: No complaint RESPIRATORY: No cough, wheezing, SOB CARDIAC: No chest pain, palpitations, lower extremity  edema  GI: No abdominal pain, No N/V/D or constipation, No heartburn or reflux  GU: No dysuria, frequency or urgency, or incontinence  MUSCULOSKELETAL: No unrelieved bone/joint pain NEUROLOGIC: No headache, dizziness  PSYCHIATRIC: No overt anxiety or sadness  Filed Vitals:   08/11/14 1503  BP: 126/69  Pulse: 70  Temp: 96.3 F (35.7 C)  Resp: 18    Physical Exam  GENERAL APPEARANCE: Alert, conversant, No acute distress , very HOH SKIN: No diaphoresis rash HEENT: Unremarkable RESPIRATORY: Breathing is even, unlabored. Lung sounds are clear   CARDIOVASCULAR: Heart RRR no murmurs, rubs or gallops. No peripheral edema  GASTROINTESTINAL: Abdomen is soft, non-tender, not distended w/ normal bowel sounds.  GENITOURINARY: Bladder  non tender, not distended  MUSCULOSKELETAL: No abnormal joints or musculature NEUROLOGIC: Cranial nerves 2-12 grossly intact. PSYCHIATRIC: Mood and affect appropriate to situation with dementia, no behavioral issues  Patient Active Problem List   Diagnosis Date Noted  . Hypocalcemia 05/27/2014  . FTT (failure to thrive) in adult 05/27/2014  . Dysphagia, oral phase 04/26/2014  . CKD (chronic kidney disease) stage 2, GFR 60-89 ml/min 04/26/2014  . Type 2 diabetes mellitus without complication 17/61/6073  . Hyperlipidemia   . Chronic renal disease, stage 2, mildly decreased glomerular filtration rate between 60-89 mL/min/1.73 square meter 10/14/2013  . Cough 06/29/2013  . Hypoglycemia 01/16/2013  . Psychosis 09/20/2012  . Diabetes mellitus type 2, uncontrolled, without complications 71/08/2692  . Dementia with behavioral disturbance 08/19/2012  . Iron deficiency anemia 08/19/2012  . Depression 08/19/2012  . Malignant neoplasm of female breast 06/30/2008  . ADENOCARCINOMA, OVARY 06/29/2008  . Anxiety state 01/16/2007    CBC    Component Value Date/Time   WBC 5.9 04/27/2014   WBC 6.1 01/18/2013 0458   WBC 4.8 10/05/2008 1424   RBC 3.96 01/18/2013 0458   RBC 3.84 10/05/2008 1424   HGB 11.2* 04/27/2014   HGB 11.5* 10/05/2008 1424   HCT 35* 04/27/2014   HCT 34.6* 10/05/2008 1424   PLT 273 04/27/2014   PLT 262 10/05/2008 1424   MCV 88.1 01/18/2013 0458   MCV 90.1 10/05/2008 1424   LYMPHSABS 1.9 01/16/2013 1145   LYMPHSABS 1.4 10/05/2008 1424   MONOABS 0.5 01/16/2013 1145   MONOABS 0.3 10/05/2008 1424   EOSABS 0.1 01/16/2013 1145   EOSABS 0.1 10/05/2008 1424   BASOSABS 0.0 01/16/2013 1145   BASOSABS 0.0 10/05/2008 1424    CMP     Component Value Date/Time   NA 140 04/27/2014   NA 139 01/18/2013 0458   K 3.7 04/27/2014   CL 106 01/18/2013 0458   CO2 28 01/18/2013 0458   GLUCOSE 156* 01/18/2013 0458   BUN 10 04/27/2014   BUN 13 01/18/2013 0458   CREATININE 0.86  01/18/2013 0458   CALCIUM 7.9* 01/18/2013 0458   PROT 6.0 01/17/2013 0504   ALBUMIN 2.5* 01/17/2013 0504   AST 9* 04/27/2014   ALT 5* 04/27/2014   ALKPHOS 57 04/27/2014   BILITOT 0.2* 01/17/2013 0504   GFRNONAA 61* 01/18/2013 0458   GFRAA 70* 01/18/2013 0458    Assessment and Plan  Depression Pt continues on Lexapro for depression without acute declines;plan-cont monitor   Psychosis Pt has been stable on risperdal o.5 mg q Hs and psych rec continue to maintain stability   Anxiety state Pt continues on lexapro for anxiety; ativan gel has been d/c;      Hennie Duos, MD

## 2014-08-11 NOTE — Assessment & Plan Note (Signed)
Pt has been stable on risperdal o.5 mg q Hs and psych rec continue to maintain stability

## 2014-08-11 NOTE — Assessment & Plan Note (Signed)
Pt continues on lexapro for anxiety; ativan gel has been d/c;

## 2014-08-11 NOTE — Assessment & Plan Note (Signed)
Pt continues on Lexapro for depression without acute declines;plan-cont monitor

## 2014-08-24 LAB — HEMOGLOBIN A1C: HEMOGLOBIN A1C: 6.3

## 2014-09-08 ENCOUNTER — Encounter: Payer: Self-pay | Admitting: Internal Medicine

## 2014-09-08 ENCOUNTER — Non-Acute Institutional Stay: Payer: Medicare HMO | Admitting: Internal Medicine

## 2014-09-08 DIAGNOSIS — E119 Type 2 diabetes mellitus without complications: Secondary | ICD-10-CM | POA: Diagnosis not present

## 2014-09-08 DIAGNOSIS — E162 Hypoglycemia, unspecified: Secondary | ICD-10-CM | POA: Diagnosis not present

## 2014-09-08 NOTE — Assessment & Plan Note (Addendum)
Nursing has relayed hypoglycemia in am fasting BS in the 50's. On 6/22 lantus 30 u was d/cand  toujeo started at 25 units q HS. Since the decrease pt is still running low in am so will decrease to 22 units QHs. Pt's A1c 6/9 was 6.3. Pt not on ACE due to age, does not need statin based on FLP.

## 2014-09-08 NOTE — Progress Notes (Signed)
MRN: [id number] Name: Jill Francis  Sex: female Age: 81 y.o. DOB: 04-12-1927  [place] [facility]: [facility] Facility/Room:216WDNR Level Of Care: SNF Provider: Inocencio Homes D Emergency Contacts: Extended Emergency Contact Information Primary Emergency Contact: [name] [place] of [place] Phone: [phone] Work Phone: [phone] Mobile Phone: [phone] Relation: Niece Secondary Emergency Contact: [place] of [place] Phone: [phone] Relation: None  Code Status: DNR  Allergies: Gabapentin; Iodine; and Sulfonamide derivatives  Chief Complaint  Patient presents with  . Medical Management of Chronic Issues    HPI: Patient is 81 y.o. female who is being seen for hypolycemia in the am.  Past Medical History  Diagnosis Date  . Cancer     Skin cancer  . Diabetes mellitus   . Depression   . Asthma   . Alzheimer's disease 08/19/2012  . Type II or unspecified type diabetes mellitus without mention of complication, uncontrolled 08/19/2012  . Hyperlipidemia     Past Surgical History  Procedure Laterality Date  . Knee surgery    . Skin cancer excision    . Back surgery    . Breast surgery  right partial mastectomy  06/2003      Medication List       This list is accurate as of: 09/08/14 12:41 PM.  Always use your most recent med list.               calcium-vitamin D 500-200 MG-UNIT per tablet  Commonly known as:  OSCAL WITH D  Take 1 tablet by mouth 3 (three) times daily.     donepezil 10 MG tablet  Commonly known as:  ARICEPT  Take 10 mg by mouth at bedtime. For dementia     escitalopram 10 MG tablet  Commonly known as:  LEXAPRO  Take 10 mg by mouth daily. For depression     ferrous sulfate 325 (65 FE) MG tablet  Take 325 mg by mouth daily with breakfast.     insulin glargine 100 UNIT/ML injection  Commonly known as:  LANTUS  Inject 30 Units into the skin at bedtime. For diabetes, hold insulin for CBG>120.     memantine 10 MG tablet  Commonly known as:  NAMENDA  Take 10 mg by mouth 2 (two) times daily. For dementia     mirtazapine 15 MG tablet  Commonly known as:  REMERON  Take 15 mg by mouth at bedtime.     protein supplement Powd  Take 1 scoop by mouth 2 (two) times daily.     risperiDONE 0.5 MG tablet  Commonly known as:  RISPERDAL  Take 0.5 mg by mouth at bedtime.     sitaGLIPtin 100 MG tablet  Commonly known as:  JANUVIA  Take 100 mg by mouth daily. For diabetes        No orders of the defined types were placed in this encounter.    Immunization History  Administered Date(s) Administered  . Influenza Whole 12/27/2012  . Influenza-Unspecified 12/27/2013  . PPD Test 06/05/2009  . Pneumococcal Polysaccharide-23 01/17/2013    History  Substance Use Topics  . Smoking status: Former Research scientist (life sciences)  . Smokeless tobacco: Not on file  . Alcohol Use: No    Review of Systems  DATA OBTAINED: from nurse GENERAL:  no fevers, fatigue, appetite changes; hypoglycemia in am SKIN: No itching, rash HEENT: No complaint RESPIRATORY: No cough, wheezing, SOB CARDIAC: No chest pain, palpitations, lower extremity edema  GI: No abdominal pain, No N/V/D or constipation,  No heartburn or reflux  GU: No dysuria, frequency or urgency, or incontinence  MUSCULOSKELETAL: No unrelieved bone/joint pain NEUROLOGIC: No headache, dizziness  PSYCHIATRIC: No overt anxiety or sadness  Filed Vitals:   09/08/14 1206  BP: 126/69  Pulse: 70  Temp: 96.3 F (35.7 C)  Resp: 18    Physical Exam  GENERAL APPEARANCE: Alert,  No acute distress  SKIN: No diaphoresis rash HEENT: Unremarkable RESPIRATORY: Breathing is even, unlabored  CARDIOVASCULAR:  No peripheral edema  GASTROINTESTINAL: Abdomen is not distended w/ normal bowel sounds.  GENITOURINARY: Bladder  not distended  MUSCULOSKELETAL: No abnormal joints or musculature NEUROLOGIC: Cranial nerves 2-12 grossly intact PSYCHIATRIC: dementia, no  behavioral issues  Patient Active Problem List   Diagnosis Date Noted  . Hypocalcemia 05/27/2014  . FTT (failure to thrive) in adult 05/27/2014  . Dysphagia, oral phase 04/26/2014  . CKD (chronic kidney disease) stage 2, GFR 60-89 ml/min 04/26/2014  . Type 2 diabetes mellitus without complication 09/10/9483  . Hyperlipidemia   . Chronic renal disease, stage 2, mildly decreased glomerular filtration rate between 60-89 mL/min/1.73 square meter 10/14/2013  . Cough 06/29/2013  . Hypoglycemia 01/16/2013  . Psychosis 09/20/2012  . Diabetes mellitus type 2, uncontrolled, without complications 46/27/0350  . Dementia with behavioral disturbance 08/19/2012  . Iron deficiency anemia 08/19/2012  . Depression 08/19/2012  . Malignant neoplasm of female breast 06/30/2008  . ADENOCARCINOMA, OVARY 06/29/2008  . Anxiety state 01/16/2007    CBC    Component Value Date/Time   WBC 5.9 04/27/2014   WBC 6.1 01/18/2013 0458   WBC 4.8 10/05/2008 1424   RBC 3.96 01/18/2013 0458   RBC 3.84 10/05/2008 1424   HGB 11.2* 04/27/2014   HGB 11.5* 10/05/2008 1424   HCT 35* 04/27/2014   HCT 34.6* 10/05/2008 1424   PLT 273 04/27/2014   PLT 262 10/05/2008 1424   MCV 88.1 01/18/2013 0458   MCV 90.1 10/05/2008 1424   LYMPHSABS 1.9 01/16/2013 1145   LYMPHSABS 1.4 10/05/2008 1424   MONOABS 0.5 01/16/2013 1145   MONOABS 0.3 10/05/2008 1424   EOSABS 0.1 01/16/2013 1145   EOSABS 0.1 10/05/2008 1424   BASOSABS 0.0 01/16/2013 1145   BASOSABS 0.0 10/05/2008 1424    CMP     Component Value Date/Time   NA 140 04/27/2014   NA 139 01/18/2013 0458   K 3.7 04/27/2014   CL 106 01/18/2013 0458   CO2 28 01/18/2013 0458   GLUCOSE 156* 01/18/2013 0458   BUN 10 04/27/2014   BUN 13 01/18/2013 0458   CREATININE 0.86 01/18/2013 0458   CALCIUM 7.9* 01/18/2013 0458   PROT 6.0 01/17/2013 0504   ALBUMIN 2.5* 01/17/2013 0504   AST 9* 04/27/2014   ALT 5* 04/27/2014   ALKPHOS 57 04/27/2014   BILITOT 0.2* 01/17/2013  0504   GFRNONAA 61* 01/18/2013 0458   GFRAA 70* 01/18/2013 0458    Assessment and Plan  Type 2 diabetes mellitus without complication Nursing has relayed hypoglycemia in am fasting BS in the 50's. On 6/22 lantus 30 u was d/cand  toujeo started at 25 units q HS. Since the decrease pt is still running low in am so will decrease to 22 units QHs. Pt's A1c 6/9 was 6.3. Pt not on ACE due to age, does not need statin based on FLP.     Hennie Duos, MD

## 2014-09-09 ENCOUNTER — Encounter (HOSPITAL_COMMUNITY): Payer: Self-pay

## 2014-09-09 ENCOUNTER — Emergency Department (HOSPITAL_COMMUNITY): Payer: Medicare PPO

## 2014-09-09 ENCOUNTER — Inpatient Hospital Stay (HOSPITAL_COMMUNITY)
Admission: EM | Admit: 2014-09-09 | Discharge: 2014-09-11 | DRG: 639 | Disposition: A | Discharge status: Skilled Nursing Facility | Payer: Medicare PPO | Attending: Internal Medicine | Admitting: Internal Medicine

## 2014-09-09 DIAGNOSIS — R627 Adult failure to thrive: Secondary | ICD-10-CM | POA: Diagnosis present

## 2014-09-09 DIAGNOSIS — D509 Iron deficiency anemia, unspecified: Secondary | ICD-10-CM | POA: Diagnosis present

## 2014-09-09 DIAGNOSIS — Z87891 Personal history of nicotine dependence: Secondary | ICD-10-CM | POA: Diagnosis not present

## 2014-09-09 DIAGNOSIS — F03918 Unspecified dementia, unspecified severity, with other behavioral disturbance: Secondary | ICD-10-CM | POA: Diagnosis present

## 2014-09-09 DIAGNOSIS — E785 Hyperlipidemia, unspecified: Secondary | ICD-10-CM | POA: Diagnosis present

## 2014-09-09 DIAGNOSIS — Z66 Do not resuscitate: Secondary | ICD-10-CM | POA: Diagnosis present

## 2014-09-09 DIAGNOSIS — F0391 Unspecified dementia with behavioral disturbance: Secondary | ICD-10-CM

## 2014-09-09 DIAGNOSIS — Z79899 Other long term (current) drug therapy: Secondary | ICD-10-CM | POA: Diagnosis not present

## 2014-09-09 DIAGNOSIS — I4581 Long QT syndrome: Secondary | ICD-10-CM | POA: Diagnosis present

## 2014-09-09 DIAGNOSIS — R001 Bradycardia, unspecified: Secondary | ICD-10-CM | POA: Diagnosis present

## 2014-09-09 DIAGNOSIS — G309 Alzheimer's disease, unspecified: Secondary | ICD-10-CM | POA: Diagnosis present

## 2014-09-09 DIAGNOSIS — Z7401 Bed confinement status: Secondary | ICD-10-CM

## 2014-09-09 DIAGNOSIS — Z85828 Personal history of other malignant neoplasm of skin: Secondary | ICD-10-CM | POA: Diagnosis not present

## 2014-09-09 DIAGNOSIS — E1165 Type 2 diabetes mellitus with hyperglycemia: Secondary | ICD-10-CM

## 2014-09-09 DIAGNOSIS — F329 Major depressive disorder, single episode, unspecified: Secondary | ICD-10-CM | POA: Diagnosis present

## 2014-09-09 DIAGNOSIS — Z794 Long term (current) use of insulin: Secondary | ICD-10-CM | POA: Diagnosis not present

## 2014-09-09 DIAGNOSIS — E162 Hypoglycemia, unspecified: Secondary | ICD-10-CM | POA: Diagnosis present

## 2014-09-09 DIAGNOSIS — R1311 Dysphagia, oral phase: Secondary | ICD-10-CM | POA: Diagnosis present

## 2014-09-09 DIAGNOSIS — F0280 Dementia in other diseases classified elsewhere without behavioral disturbance: Secondary | ICD-10-CM | POA: Diagnosis present

## 2014-09-09 DIAGNOSIS — F0390 Unspecified dementia without behavioral disturbance: Secondary | ICD-10-CM | POA: Diagnosis not present

## 2014-09-09 DIAGNOSIS — F411 Generalized anxiety disorder: Secondary | ICD-10-CM | POA: Diagnosis present

## 2014-09-09 DIAGNOSIS — E11649 Type 2 diabetes mellitus with hypoglycemia without coma: Principal | ICD-10-CM | POA: Diagnosis present

## 2014-09-09 DIAGNOSIS — IMO0001 Reserved for inherently not codable concepts without codable children: Secondary | ICD-10-CM | POA: Diagnosis present

## 2014-09-09 DIAGNOSIS — F419 Anxiety disorder, unspecified: Secondary | ICD-10-CM | POA: Diagnosis present

## 2014-09-09 LAB — CBC WITH DIFFERENTIAL/PLATELET
BASOS PCT: 1 % (ref 0–1)
Basophils Absolute: 0 10*3/uL (ref 0.0–0.1)
EOS PCT: 1 % (ref 0–5)
Eosinophils Absolute: 0.1 10*3/uL (ref 0.0–0.7)
HCT: 38.8 % (ref 36.0–46.0)
HEMOGLOBIN: 12.9 g/dL (ref 12.0–15.0)
Lymphocytes Relative: 22 % (ref 12–46)
Lymphs Abs: 1.4 10*3/uL (ref 0.7–4.0)
MCH: 29.7 pg (ref 26.0–34.0)
MCHC: 33.2 g/dL (ref 30.0–36.0)
MCV: 89.4 fL (ref 78.0–100.0)
Monocytes Absolute: 0.4 10*3/uL (ref 0.1–1.0)
Monocytes Relative: 7 % (ref 3–12)
Neutro Abs: 4.6 10*3/uL (ref 1.7–7.7)
Neutrophils Relative %: 69 % (ref 43–77)
Platelets: 298 10*3/uL (ref 150–400)
RBC: 4.34 MIL/uL (ref 3.87–5.11)
RDW: 14.8 % (ref 11.5–15.5)
WBC: 6.6 10*3/uL (ref 4.0–10.5)

## 2014-09-09 LAB — URINALYSIS, ROUTINE W REFLEX MICROSCOPIC
BILIRUBIN URINE: NEGATIVE
Glucose, UA: 250 mg/dL — AB
Hgb urine dipstick: NEGATIVE
Ketones, ur: NEGATIVE mg/dL
LEUKOCYTES UA: NEGATIVE
Nitrite: NEGATIVE
PROTEIN: NEGATIVE mg/dL
SPECIFIC GRAVITY, URINE: 1.024 (ref 1.005–1.030)
Urobilinogen, UA: 0.2 mg/dL (ref 0.0–1.0)
pH: 5 (ref 5.0–8.0)

## 2014-09-09 LAB — COMPREHENSIVE METABOLIC PANEL
ALT: 12 U/L — ABNORMAL LOW (ref 14–54)
ANION GAP: 7 (ref 5–15)
AST: 20 U/L (ref 15–41)
Albumin: 2.7 g/dL — ABNORMAL LOW (ref 3.5–5.0)
Alkaline Phosphatase: 67 U/L (ref 38–126)
BILIRUBIN TOTAL: 0.4 mg/dL (ref 0.3–1.2)
BUN: 15 mg/dL (ref 6–20)
CHLORIDE: 109 mmol/L (ref 101–111)
CO2: 27 mmol/L (ref 22–32)
Calcium: 7.6 mg/dL — ABNORMAL LOW (ref 8.9–10.3)
Creatinine, Ser: 0.79 mg/dL (ref 0.44–1.00)
GLUCOSE: 140 mg/dL — AB (ref 65–99)
POTASSIUM: 4.4 mmol/L (ref 3.5–5.1)
Sodium: 143 mmol/L (ref 135–145)
Total Protein: 6.3 g/dL — ABNORMAL LOW (ref 6.5–8.1)

## 2014-09-09 LAB — I-STAT TROPONIN, ED: Troponin i, poc: 0 ng/mL (ref 0.00–0.08)

## 2014-09-09 LAB — CBG MONITORING, ED
GLUCOSE-CAPILLARY: 58 mg/dL — AB (ref 65–99)
GLUCOSE-CAPILLARY: 65 mg/dL (ref 65–99)
Glucose-Capillary: 121 mg/dL — ABNORMAL HIGH (ref 65–99)
Glucose-Capillary: 66 mg/dL (ref 65–99)
Glucose-Capillary: 63 mg/dL — ABNORMAL LOW (ref 65–99)
Glucose-Capillary: 198 mg/dL — ABNORMAL HIGH (ref 65–99)

## 2014-09-09 LAB — TSH: TSH: 4.249 u[IU]/mL (ref 0.350–4.500)

## 2014-09-09 MED ORDER — MEMANTINE HCL 10 MG PO TABS
10.0000 mg | ORAL_TABLET | Freq: Two times a day (BID) | ORAL | Status: DC
  Administered 2014-09-10 – 2014-09-11 (×3): 10 mg via ORAL
  Filled 2014-09-09 (×4): qty 1

## 2014-09-09 MED ORDER — ONDANSETRON HCL 4 MG/2ML IJ SOLN: 4.0000 mg | Freq: Four times a day (QID) | INTRAMUSCULAR | Status: DC | PRN

## 2014-09-09 MED ORDER — FERROUS SULFATE 325 (65 FE) MG PO TABS
325.0000 mg | ORAL_TABLET | Freq: Every day | ORAL | Status: DC
  Administered 2014-09-10 – 2014-09-11 (×2): 325 mg via ORAL
  Filled 2014-09-09 (×3): qty 1

## 2014-09-09 MED ORDER — DEXTROSE-NACL 5-0.9 % IV SOLN
INTRAVENOUS | Status: DC
  Administered 2014-09-09: 23:00:00 via INTRAVENOUS

## 2014-09-09 MED ORDER — DEXTROSE-NACL 5-0.9 % IV SOLN
INTRAVENOUS | Status: DC
  Administered 2014-09-09: 19:00:00 via INTRAVENOUS

## 2014-09-09 MED ORDER — ESCITALOPRAM OXALATE 10 MG PO TABS
10.0000 mg | ORAL_TABLET | Freq: Every day | ORAL | Status: DC
  Administered 2014-09-09 – 2014-09-11 (×3): 10 mg via ORAL
  Filled 2014-09-09 (×3): qty 1

## 2014-09-09 MED ORDER — RISPERIDONE 0.25 MG PO TABS
0.2500 mg | ORAL_TABLET | ORAL | Status: DC
  Administered 2014-09-10: 0.25 mg via ORAL
  Filled 2014-09-09: qty 1

## 2014-09-09 MED ORDER — ENOXAPARIN SODIUM 40 MG/0.4ML ~~LOC~~ SOLN
40.0000 mg | SUBCUTANEOUS | Status: DC
  Administered 2014-09-09 – 2014-09-10 (×2): 40 mg via SUBCUTANEOUS
  Filled 2014-09-09 (×3): qty 0.4

## 2014-09-09 MED ORDER — ONDANSETRON HCL 4 MG PO TABS: 4.0000 mg | ORAL_TABLET | Freq: Four times a day (QID) | ORAL | Status: DC | PRN

## 2014-09-09 MED ORDER — RISPERIDONE 0.5 MG PO TABS: 0.2500 mg | ORAL_TABLET | Freq: Every day | ORAL | Status: DC

## 2014-09-09 MED ORDER — SODIUM CHLORIDE 0.9 % IJ SOLN: 3.0000 mL | Freq: Two times a day (BID) | INTRAMUSCULAR | Status: DC

## 2014-09-09 MED ORDER — DONEPEZIL HCL 5 MG PO TABS
5.0000 mg | ORAL_TABLET | Freq: Every day | ORAL | Status: DC
  Administered 2014-09-10: 5 mg via ORAL
  Filled 2014-09-09 (×2): qty 1

## 2014-09-09 MED ORDER — BENEPROTEIN PO POWD
1.0000 | Freq: Two times a day (BID) | ORAL | Status: DC
  Administered 2014-09-10 – 2014-09-11 (×3): 6 g via ORAL
  Filled 2014-09-09: qty 227

## 2014-09-09 MED ORDER — MEMANTINE HCL 10 MG PO TABS: 10.0000 mg | ORAL_TABLET | Freq: Two times a day (BID) | ORAL | Status: DC

## 2014-09-09 MED ORDER — DONEPEZIL HCL 5 MG PO TABS: 10.0000 mg | ORAL_TABLET | Freq: Every day | ORAL | Status: DC

## 2014-09-09 MED ORDER — DEXTROSE 5 % IV SOLN: INTRAVENOUS | Status: DC

## 2014-09-09 MED ORDER — ACETAMINOPHEN 325 MG PO TABS: 650.0000 mg | ORAL_TABLET | Freq: Four times a day (QID) | ORAL | Status: DC | PRN

## 2014-09-09 MED ORDER — MIRTAZAPINE 15 MG PO TABS: 15.0000 mg | ORAL_TABLET | Freq: Every day | ORAL | Status: DC

## 2014-09-09 MED ORDER — DEXTROSE 50 % IV SOLN
50.0000 mL | Freq: Once | INTRAVENOUS | Status: AC
  Administered 2014-09-09: 50 mL via INTRAVENOUS
  Filled 2014-09-09: qty 50

## 2014-09-09 MED ORDER — ACETAMINOPHEN 650 MG RE SUPP: 650.0000 mg | Freq: Four times a day (QID) | RECTAL | Status: DC | PRN

## 2014-09-09 NOTE — ED Provider Notes (Signed)
CSN: [id number]     Arrival date & time 09/09/14  1056 History   First MD Initiated Contact with Patient 09/09/14 1102     Chief Complaint  Patient presents with  . Hypoglycemia   Level V caveat: Dementia  (Consider location/radiation/quality/duration/timing/severity/associated sxs/prior Treatment) HPI Jill Francis is a 81 y.o. female with a history of dementia (only oriented to person), type 2 diabetes on insulin, comes in for evaluation of hypoglycemia. History of present illness is obtained via nursing and EMS notes. Nursing contacted [facility] and confirms patient is at baseline. They report giving her insulin this morning, but are unsure if they fed her after administered and insulin. EMS was called and administered one amp of D50 in route and reports patient returned to baseline. EMS also notes patient was bradycardic with an irregular heartbeat which is new. Unable to contact family at this time.  Past Medical History  Diagnosis Date  . Cancer     Skin cancer  . Diabetes mellitus   . Depression   . Asthma   . Alzheimer's disease 08/19/2012  . Type II or unspecified type diabetes mellitus without mention of complication, uncontrolled 08/19/2012  . Hyperlipidemia    Past Surgical History  Procedure Laterality Date  . Knee surgery    . Skin cancer excision    . Back surgery    . Breast surgery  right partial mastectomy  06/2003   History reviewed. No pertinent family history. History  Substance Use Topics  . Smoking status: Former Research scientist (life sciences)  . Smokeless tobacco: Not on file  . Alcohol Use: No   OB History    No data available     Review of Systems  Unable to perform ROS     Allergies  Gabapentin; Iodine; and Sulfonamide derivatives  Home Medications   Prior to Admission medications   Medication Sig Start Date End Date Taking? Authorizing Provider  calcium-vitamin D (OSCAL WITH D) 500-200 MG-UNIT per tablet Take 1 tablet by mouth 3 (three) times daily.   Yes  Historical Provider, MD  donepezil (ARICEPT) 10 MG tablet Take 10 mg by mouth at bedtime. For dementia 10/28/10  Yes Historical Provider, MD  escitalopram (LEXAPRO) 10 MG tablet Take 10 mg by mouth daily. For depression   Yes Historical Provider, MD  ferrous sulfate 325 (65 FE) MG tablet Take 325 mg by mouth daily with breakfast.   Yes Historical Provider, MD  Insulin Glargine (TOUJEO SOLOSTAR) 300 UNIT/ML SOPN Inject 22 Units into the skin at bedtime.   Yes Historical Provider, MD  memantine (NAMENDA) 10 MG tablet Take 10 mg by mouth 2 (two) times daily. For dementia   Yes Historical Provider, MD  mirtazapine (REMERON) 15 MG tablet Take 15 mg by mouth at bedtime.   Yes Historical Provider, MD  risperiDONE (RISPERDAL) 0.25 MG tablet Take 0.25 mg by mouth at bedtime.   Yes Historical Provider, MD  sitaGLIPtin (JANUVIA) 100 MG tablet Take 100 mg by mouth daily. For diabetes   Yes Historical Provider, MD  triazolam (HALCION) 0.25 MG tablet Take 0.25 mg by mouth at bedtime as needed (Prior to dental procedure as directed by the [facility]).   Yes Historical Provider, MD  protein supplement (RESOURCE BENEPROTEIN) POWD Take 1 scoop by mouth 2 (two) times daily.    Historical Provider, MD   BP 122/99 mmHg  Pulse 73  Temp(Src) 97.1 F (36.2 C) (Rectal)  Resp 14  Ht 5\' 4"  (1.626 m)  Wt  210 lb (95.255 kg)  BMI 36.03 kg/m2  SpO2 100% Physical Exam  Constitutional: She is oriented to person, place, and time. She appears well-developed and well-nourished.  HENT:  Head: Normocephalic and atraumatic.  Mouth/Throat: Oropharynx is clear and moist.  Eyes: Conjunctivae are normal. Pupils are equal, round, and reactive to light. Right eye exhibits no discharge. Left eye exhibits no discharge. No scleral icterus.  Neck: Neck supple.  Cardiovascular: Normal heart sounds.  Exam reveals no gallop and no friction rub.   No murmur heard. Bradycardic with irregular rhythm.  Pulmonary/Chest: Effort  normal and breath sounds normal. No respiratory distress. She has no wheezes. She has no rales.  Abdominal: Soft. There is no tenderness.  Musculoskeletal: She exhibits no tenderness.  Neurological: She is alert and oriented to person, place, and time.  Patient oriented to self  Skin: Skin is warm and dry. No rash noted.  Psychiatric: She has a normal mood and affect.  Nursing note and vitals reviewed.   ED Course  Procedures (including critical care time) Labs Review Labs Reviewed  COMPREHENSIVE METABOLIC PANEL - Abnormal; Notable for the following:    Glucose, Bld 140 (*)    Calcium 7.6 (*)    Total Protein 6.3 (*)    Albumin 2.7 (*)    ALT 12 (*)    All other components within normal limits  URINALYSIS, ROUTINE W REFLEX MICROSCOPIC (NOT AT [facility]) - Abnormal; Notable for the following:    Glucose, UA 250 (*)    All other components within normal limits  CBG MONITORING, ED - Abnormal; Notable for the following:    Glucose-Capillary 121 (*)    All other components within normal limits  CBC WITH DIFFERENTIAL/PLATELET  TSH  CBG MONITORING, ED  Randolm Idol, ED    Imaging Review Dg Chest 2 View  09/09/2014   CLINICAL DATA:  Hyperglycemia and bradycardia.  EXAM: CHEST  2 VIEW  COMPARISON:  01/16/2013  FINDINGS: Lungs are somewhat hypoinflated without consolidation or effusion. There is mild cardiomegaly. There are degenerative changes of the spine. Surgical clips are present over the neck.  IMPRESSION: Hypoinflation without acute cardiopulmonary disease.   Electronically Signed   By: Marin Olp M.D.   On: 09/09/2014 12:10     EKG Interpretation   Date/Time:  Saturday September 09 2014 12:51:15 EDT Ventricular Rate:  45 PR Interval:  164 QRS Duration: 84 QT Interval:  500 QTC Calculation: 433 R Axis:   17 Text Interpretation:  Atrial fibrillation Ventricular premature complex  Low voltage, extremity and precordial leads Nonspecific T abnormalities,  lateral leads ED  PHYSICIAN INTERPRETATION AVAILABLE IN [facility]  Confirmed by TEST, Record ([id number]) on 09/11/2014 7:00:29 AM        Meds given in ED:  Medications - No data to display  New Prescriptions   No medications on file   Filed Vitals:   09/09/14 1115 09/09/14 1230 09/09/14 1255 09/09/14 1327  BP: 138/93 113/74 122/99   Pulse: 41  73   Temp:    97.1 F (36.2 C)  TempSrc:    Rectal  Resp: 13 21 14    Height:      Weight:      SpO2: 100%  100%     MDM  Vitals stable mild bradycardia-afebrile Pt resting comfortably in ED. daughter is at bedside who confirms patient is at baseline. PE--bradycardia, otherwise physical exam is not concerning. Question sinus rhythm on EKG. Patient will need follow-up with cardiology/PCP. Labwork--labs are noncontributory.  Imaging--chest x-ray shows hypoinflation without acute cardiopulmonary disease.  The patient has some instances of bradycardia, she remains asymptomatic in the ED. No evidence of other acute or emergent pathology at this time. I discussed and reviewed this case with my attending, Dr. Alvino Chapel who also saw and evaluated the patient. I discussed all relevant lab findings and imaging results with pt and they verbalized understanding. I personally reviewed the imaging and agree with the results as interpreted by the radiologist. Discussed f/u with PCP within 48 hrs and return precautions, pt very amenable to plan. Patient stable, good condition and is appropriate for discharge.  Final diagnoses:  Bradycardia  Hypoglycemia       Comer Locket, PA-C 09/11/14 [place], MD 09/11/14 [phone]

## 2014-09-09 NOTE — ED Provider Notes (Signed)
Patient here for hypoglycemia earlier. Did not eat. Blod sugar when checked in the parking lot by PTAR on the way out was 60. He was 60 on recheck also. Patient back in the ED, checked in, and fed. Given peanut butter, OJ. Despite eating, unable to get blood sugar out of the 60s. D5 initiated, plan on admission.  1. Bradycardia   2. Hypoglycemia      Evelina Bucy, MD 09/09/14 [phone]

## 2014-09-09 NOTE — Discharge Instructions (Signed)
You were evaluated in the ED today for your low blood sugar. Your blood sugar now is much better. You are also found to have a slower heart rate in the ED. Your x-ray and labs were all very reassuring. It is important. Follow-up with primary care for further evaluation and management of your blood sugar and slow heart rate. Return to ED for new or worsening symptoms.  Blood Glucose Monitoring Monitoring your blood glucose (also know as blood sugar) helps you to manage your diabetes. It also helps you and your health care provider monitor your diabetes and determine how well your treatment plan is working. WHY SHOULD YOU MONITOR YOUR BLOOD GLUCOSE?  It can help you understand how food, exercise, and medicine affect your blood glucose.  It allows you to know what your blood glucose is at any given moment. You can quickly tell if you are having low blood glucose (hypoglycemia) or high blood glucose (hyperglycemia).  It can help you and your health care provider know how to adjust your medicines.  It can help you understand how to manage an illness or adjust medicine for exercise. WHEN SHOULD YOU TEST? Your health care provider will help you decide how often you should check your blood glucose. This may depend on the type of diabetes you have, your diabetes control, or the types of medicines you are taking. Be sure to write down all of your blood glucose readings so that this information can be reviewed with your health care provider. See below for examples of testing times that your health care provider may suggest. Type 1 Diabetes  Test 4 times a day if you are in good control, using an insulin pump, or perform multiple daily injections.  If your diabetes is not well controlled or if you are sick, you may need to monitor more often.  It is a good idea to also monitor:  Before and after exercise.  Between meals and 2 hours after a meal.  Occasionally between 2:00 a.m. and 3:00 a.m. Type 2  Diabetes  It can vary with each person, but generally, if you are on insulin, test 4 times a day.  If you take medicines by mouth (orally), test 2 times a day.  If you are on a controlled diet, test once a day.  If your diabetes is not well controlled or if you are sick, you may need to monitor more often. HOW TO MONITOR YOUR BLOOD GLUCOSE Supplies Needed  Blood glucose meter.  Test strips for your meter. Each meter has its own strips. You must use the strips that go with your own meter.  A pricking needle (lancet).  A device that holds the lancet (lancing device).  A journal or log book to write down your results. Procedure  Wash your hands with soap and water. Alcohol is not preferred.  Prick the side of your finger (not the tip) with the lancet.  Gently milk the finger until a small drop of blood appears.  Follow the instructions that come with your meter for inserting the test strip, applying blood to the strip, and using your blood glucose meter. Other Areas to Get Blood for Testing Some meters allow you to use other areas of your body (other than your finger) to test your blood. These areas are called alternative sites. The most common alternative sites are:  The forearm.  The thigh.  The back area of the lower leg.  The palm of the hand. The blood flow in  these areas is slower. Therefore, the blood glucose values you get may be delayed, and the numbers are different from what you would get from your fingers. Do not use alternative sites if you think you are having hypoglycemia. Your reading will not be accurate. Always use a finger if you are having hypoglycemia. Also, if you cannot feel your lows (hypoglycemia unawareness), always use your fingers for your blood glucose checks. ADDITIONAL TIPS FOR GLUCOSE MONITORING  Do not reuse lancets.  Always carry your supplies with you.  All blood glucose meters have a 24-hour "hotline" number to call if you have questions  or need help.  Adjust (calibrate) your blood glucose meter with a control solution after finishing a few boxes of strips. BLOOD GLUCOSE RECORD KEEPING It is a good idea to keep a daily record or log of your blood glucose readings. Most glucose meters, if not all, keep your glucose records stored in the meter. Some meters come with the ability to download your records to your home computer. Keeping a record of your blood glucose readings is especially helpful if you are wanting to look for patterns. Make notes to go along with the blood glucose readings because you might forget what happened at that exact time. Keeping good records helps you and your health care provider to work together to achieve good diabetes management.  Document Released: 03/06/2003 Document Revised: 07/18/2013 Document Reviewed: 07/26/2012 [facility] Patient Information 2015 [place], [place]. This information is not intended to replace advice given to you by your health care provider. Make sure you discuss any questions you have with your health care provider.  Bradycardia Bradycardia is a term for a heart rate (pulse) that, in adults, is slower than 60 beats per minute. A normal rate is 60 to 100 beats per minute. A heart rate below 60 beats per minute may be normal for some adults with healthy hearts. If the rate is too slow, the heart may have trouble pumping the volume of blood the body needs. If the heart rate gets too low, blood flow to the brain may be decreased and may make you feel lightheaded, dizzy, or faint. The heart has a natural pacemaker in the top of the heart called the SA node (sinoatrial or sinus node). This pacemaker sends out regular electrical signals to the muscle of the heart, telling the heart muscle when to beat (contract). The electrical signal travels from the upper parts of the heart (atria) through the AV node (atrioventricular node), to the lower chambers of the heart (ventricles). The ventricles squeeze,  pumping the blood from your heart to your lungs and to the rest of your body. CAUSES   Problem with the heart's electrical system.  Problem with the heart's natural pacemaker.  Heart disease, damage, or infection.  Medications.  Problems with minerals and salts (electrolytes). SYMPTOMS   Fainting (syncope).  Fatigue and weakness.  Shortness of breath (dyspnea).  Chest pain (angina).  Drowsiness.  Confusion. DIAGNOSIS   An electrocardiogram (ECG) can help your caregiver determine the type of slow heart rate you have.  If the cause is not seen on an ECG, you may need to wear a heart monitor that records your heart rhythm for several hours or days.  Blood tests. TREATMENT   Electrolyte supplements.  Medications.  Withholding medication which is causing a slow heart rate.  Pacemaker placement. SEEK IMMEDIATE MEDICAL CARE IF:   You feel lightheaded or faint.  You develop an irregular heart rate.  You  feel chest pain or have trouble breathing. MAKE SURE YOU:   Understand these instructions.  Will watch your condition.  Will get help right away if you are not doing well or get worse. Document Released: 11/23/2001 Document Revised: 05/26/2011 Document Reviewed: 06/08/2013 [facility] Patient Information 2015 [place], [place]. This information is not intended to replace advice given to you by your health care provider. Make sure you discuss any questions you have with your health care provider.

## 2014-09-09 NOTE — ED Notes (Signed)
Pt CBG was checked before transport back to facility and it was 60. Pt re-roomed to same room for monitoring and food. Pt was fed peanut butter, OJ, and a sandwich. Will monitor CBG regularly.

## 2014-09-09 NOTE — ED Notes (Signed)
Pt offered food- refused.

## 2014-09-09 NOTE — ED Notes (Signed)
Pt brought from [facility] for hypoglycemia.  Pt blood sugar was 46 upon EMS arrival to facility.  Pt was given insulin this morning by facility and they were unaware if she ate after.  Pt was given 1 amp D50 PTA and returned to baseline.  Pt also has an irregular, bradycardic heart rate with no cardiac hx.  EMS attempted to contact family but was unable to do so.

## 2014-09-09 NOTE — ED Notes (Signed)
Pt resting in bed. NT brought pt food. PTAR brought pt back due to low CBG. MD notified

## 2014-09-09 NOTE — ED Notes (Signed)
Attempted report 

## 2014-09-09 NOTE — H&P (Signed)
[facility] History and Physical  Patient: Jill Francis  MRN: [id number]  DOB: 1928/02/07  DOS: the patient was seen and examined on 09/09/2014 PCP: Cyndee Brightly, MD  Referring physician: Dr. Mingo Amber Chief Complaint: Low sugar  HPI: Jill Francis is a 81 y.o. female with Past medical history of diabetes mellitus type 2 on insulin, Alzheimer's dementia, depression, chronic bedridden state. The patient is presenting with complaints of low blood sugar. The patient is pleasantly confused and is unable to provide any history. At her baseline the patient is mostly in the bed and ambulates with a wheelchair. The patient is also speaking out of context and also incoherently at her baseline. She does not move significantly her left leg at her baseline. Patient has been having low blood sugars since this morning along with that she was also found to be having low heart rate. With that the patient was brought here. There was no nausea vomiting diarrhea or fall reported. At the time of my evaluation the daughter was at her bedside who manages the patient is at her baseline mentally. No fever no chills reported. Chest x-ray is clear. EKG shows atrial fibrillation with bradycardia. Patient was initially planned to be discharged back to the nursing home after her sugar improved and later on she dropped  Theback to 60s and therefore was requested to be admitted. At the time of my evaluation her sugars were 223. Nursing home was in the presence of transitioning her from Lantus to [facility].  patient is coming from SNF.  At her baseline ambulates wheelchair and essentially bedridden And is dependent for most of her ADL does not manages her medication on her own.  Review of Systems: as mentioned in the history of present illness.  A comprehensive review of the other systems is negative.  Past Medical History  Diagnosis Date  . Cancer     Skin cancer  . Diabetes mellitus   .  Depression   . Asthma   . Alzheimer's disease 08/19/2012  . Type II or unspecified type diabetes mellitus without mention of complication, uncontrolled 08/19/2012  . Hyperlipidemia    Past Surgical History  Procedure Laterality Date  . Knee surgery    . Skin cancer excision    . Back surgery    . Breast surgery  right partial mastectomy  06/2003   Social History:  reports that she has quit smoking. She does not have any smokeless tobacco history on file. She reports that she does not drink alcohol or use illicit drugs.  Allergies  Allergen Reactions  . Gabapentin     Per mar  . Iodine     Per mar  . Sulfonamide Derivatives     Per mar    History reviewed. No pertinent family history.  Prior to Admission medications   Medication Sig Start Date End Date Taking? Authorizing Provider  calcium-vitamin D (OSCAL WITH D) 500-200 MG-UNIT per tablet Take 1 tablet by mouth 3 (three) times daily.   Yes Historical Provider, MD  donepezil (ARICEPT) 10 MG tablet Take 10 mg by mouth at bedtime. For dementia 10/28/10  Yes Historical Provider, MD  escitalopram (LEXAPRO) 10 MG tablet Take 10 mg by mouth daily. For depression   Yes Historical Provider, MD  ferrous sulfate 325 (65 FE) MG tablet Take 325 mg by mouth daily with breakfast.   Yes Historical Provider, MD  Insulin Glargine (TOUJEO SOLOSTAR) 300 UNIT/ML SOPN Inject 22 Units into the skin at bedtime.  Yes Historical Provider, MD  memantine (NAMENDA) 10 MG tablet Take 10 mg by mouth 2 (two) times daily. For dementia   Yes Historical Provider, MD  mirtazapine (REMERON) 15 MG tablet Take 15 mg by mouth at bedtime.   Yes Historical Provider, MD  risperiDONE (RISPERDAL) 0.25 MG tablet Take 0.25 mg by mouth at bedtime.   Yes Historical Provider, MD  sitaGLIPtin (JANUVIA) 100 MG tablet Take 100 mg by mouth daily. For diabetes   Yes Historical Provider, MD  triazolam (HALCION) 0.25 MG tablet Take 0.25 mg by mouth at bedtime as needed (Prior to dental  procedure as directed by the [facility]).   Yes Historical Provider, MD  protein supplement (RESOURCE BENEPROTEIN) POWD Take 1 scoop by mouth 2 (two) times daily.    Historical Provider, MD    Physical Exam: Filed Vitals:   09/09/14 1631 09/09/14 1846 09/09/14 1930 09/09/14 2047  BP: 154/45 130/96  130/87  Pulse: 50 55 50 54  Temp:      TempSrc:      Resp: 19  20 20   Height:      Weight:      SpO2: 100% 92% 100% 99%    General: Alert, Awake and not oriented. Appear in mild distress Eyes: PERRL ENT: Oral Mucosa clear moist. Neck: Difficult to assess JVD Cardiovascular: S1 and S2 Present, no Murmur, Peripheral Pulses Present Respiratory: Bilateral Air entry equal and Decreased,  Clear to Auscultation, no Crackles, no wheezes Abdomen: Bowel Sound present, Soft and non tender Skin: no Rash Extremities: no Pedal edema, no calf tenderness Neurologic: Grossly no focal neuro deficit.  Labs on Admission:  CBC:  Recent Labs Lab 09/09/14 1148  WBC 6.6  NEUTROABS 4.6  HGB 12.9  HCT 38.8  MCV 89.4  PLT 298    CMP     Component Value Date/Time   NA 143 09/09/2014 1148   NA 140 04/27/2014   K 4.4 09/09/2014 1148   CL 109 09/09/2014 1148   CO2 27 09/09/2014 1148   GLUCOSE 140* 09/09/2014 1148   BUN 15 09/09/2014 1148   BUN 10 04/27/2014   CREATININE 0.79 09/09/2014 1148   CALCIUM 7.6* 09/09/2014 1148   PROT 6.3* 09/09/2014 1148   ALBUMIN 2.7* 09/09/2014 1148   AST 20 09/09/2014 1148   ALT 12* 09/09/2014 1148   ALKPHOS 67 09/09/2014 1148   BILITOT 0.4 09/09/2014 1148   GFRNONAA >60 09/09/2014 1148   GFRAA >60 09/09/2014 1148    No results for input(s): LIPASE, AMYLASE in the last 168 hours.  No results for input(s): CKTOTAL, CKMB, CKMBINDEX, TROPONINI in the last 168 hours. BNP (last 3 results) No results for input(s): BNP in the last 8760 hours.  ProBNP (last 3 results) No results for input(s): PROBNP in the last 8760 hours.   Radiological  Exams on Admission: Dg Chest 2 View  09/09/2014   CLINICAL DATA:  Hyperglycemia and bradycardia.  EXAM: CHEST  2 VIEW  COMPARISON:  01/16/2013  FINDINGS: Lungs are somewhat hypoinflated without consolidation or effusion. There is mild cardiomegaly. There are degenerative changes of the spine. Surgical clips are present over the neck.  IMPRESSION: Hypoinflation without acute cardiopulmonary disease.   Electronically Signed   By: Marin Olp M.D.   On: 09/09/2014 12:10   EKG: Independently reviewed. sinus bradycardia,  Repeat atrial fibrillation, bradycardia .  Assment/Plan Principal Problem:   Hypoglycemia Active Problems:   Anxiety state   Diabetes mellitus type 2, uncontrolled, without complications  Dementia with behavioral disturbance   Dysphagia, oral phase   FTT (failure to thrive) in adult   1. Hypoglycemia The patient is presenting with complaints of recurrent hypoglycemic episodes. The patient is in the process of transitioning her from Lantus to [facility]. Initially she was on 30 units of Lantus in the nursing home has been giving her 25 units of Toujeo since 09/06/2014. Looking from the documentation they were attempting to reduce her insulin from 25 to 22 starting tonight. And then due to consistent hypoglycemia she was brought here. Most likely this is secondary to high concentration of the new insulin. Patient currently remain off the insulin. We will monitor her CBG every 4 hours. She will not be on any D5 drip at present. Should her sugars continues to remain elevated resume insulin sliding scale with sensitive protocol. due to persistent hypoglycemia recommended to start at a lower dose of Toujeo- 15 units and then uptitrated as needed along with sliding scale on discharge.  2.dementia, depression. Currently the patient appears to be pleasantly confused. Does not appear to be agitated. She has bradycardia along with prolonged QTC and therefore I will discontinue her  Remeron, reduce the dose of Aricept as well as changes risperidone every other day.  3. bradycardia with prolonged QTC. Patient's QTC is 500 patient has bradycardia with heart rate in 50s. At present patient is not symptomatic from this. Medication adjustment as above secondary to significant side effects from his medications.  4. history of iron deficiency anemia. Continue iron supplements.  Advance goals of care discussion: DNR/DNI as per documentation available    DVT Prophylaxis: subcutaneous Heparin Nutrition: Regular diet  Family Communication: family was present at bedside, opportunity was given to ask question and all questions were answered satisfactorily at the time of interview. Disposition: Admitted as inpatient, Telemetry unit.  Author: Berle Mull, MD [facility] Pager: [phone] 09/09/2014  If 7PM-7AM, please contact night-coverage [email or web address] Password [email or web address]

## 2014-09-10 DIAGNOSIS — F0390 Unspecified dementia without behavioral disturbance: Secondary | ICD-10-CM

## 2014-09-10 DIAGNOSIS — E162 Hypoglycemia, unspecified: Secondary | ICD-10-CM

## 2014-09-10 DIAGNOSIS — I4581 Long QT syndrome: Secondary | ICD-10-CM

## 2014-09-10 DIAGNOSIS — R627 Adult failure to thrive: Secondary | ICD-10-CM

## 2014-09-10 LAB — COMPREHENSIVE METABOLIC PANEL
ALBUMIN: 2.3 g/dL — AB (ref 3.5–5.0)
ALT: 11 U/L — AB (ref 14–54)
AST: 16 U/L (ref 15–41)
Alkaline Phosphatase: 62 U/L (ref 38–126)
Anion gap: 4 — ABNORMAL LOW (ref 5–15)
BILIRUBIN TOTAL: 0.2 mg/dL — AB (ref 0.3–1.2)
BUN: 15 mg/dL (ref 6–20)
CO2: 31 mmol/L (ref 22–32)
Calcium: 7.4 mg/dL — ABNORMAL LOW (ref 8.9–10.3)
Chloride: 108 mmol/L (ref 101–111)
Creatinine, Ser: 0.84 mg/dL (ref 0.44–1.00)
GFR calc Af Amer: >60 mL/min (ref >60)
GFR calc non Af Amer: >60 mL/min (ref >60)
Glucose, Bld: 82 mg/dL (ref 65–99)
Potassium: 4.1 mmol/L (ref 3.5–5.1)
Sodium: 143 mmol/L (ref 135–145)
Total Protein: 5.4 g/dL — ABNORMAL LOW (ref 6.5–8.1)

## 2014-09-10 LAB — CBC
HCT: 33.6 % — ABNORMAL LOW (ref 36.0–46.0)
Hemoglobin: 10.9 g/dL — ABNORMAL LOW (ref 12.0–15.0)
MCH: 28.9 pg (ref 26.0–34.0)
MCHC: 32.4 g/dL (ref 30.0–36.0)
MCV: 89.1 fL (ref 78.0–100.0)
Platelets: 270 10*3/uL (ref 150–400)
RBC: 3.77 MIL/uL — ABNORMAL LOW (ref 3.87–5.11)
RDW: 14.8 % (ref 11.5–15.5)
WBC: 8.4 10*3/uL (ref 4.0–10.5)

## 2014-09-10 LAB — GLUCOSE, CAPILLARY
GLUCOSE-CAPILLARY: 137 mg/dL — AB (ref 65–99)
GLUCOSE-CAPILLARY: 159 mg/dL — AB (ref 65–99)
GLUCOSE-CAPILLARY: 162 mg/dL — AB (ref 65–99)
GLUCOSE-CAPILLARY: 54 mg/dL — AB (ref 65–99)
Glucose-Capillary: 51 mg/dL — ABNORMAL LOW (ref 65–99)
Glucose-Capillary: 144 mg/dL — ABNORMAL HIGH (ref 65–99)
Glucose-Capillary: 77 mg/dL (ref 65–99)
Glucose-Capillary: 158 mg/dL — ABNORMAL HIGH (ref 65–99)

## 2014-09-10 LAB — PROTIME-INR
INR: 1.14 (ref 0.00–1.49)
Prothrombin Time: 14.8 seconds (ref 11.6–15.2)

## 2014-09-10 MED ORDER — DEXTROSE 50 % IV SOLN
50.0000 mL | Freq: Once | INTRAVENOUS | Status: AC
  Administered 2014-09-10: 50 mL via INTRAVENOUS
  Filled 2014-09-10: qty 50

## 2014-09-10 NOTE — Progress Notes (Signed)
[facility] PROGRESS NOTE  Jill Francis ZRA:[id number] DOB: 1927/08/08 DOA: 09/09/2014 PCP: No primary care provider on file.  Assessment/Plan: 1. Hypoglycemia -I think likely secondary to recent transition to [facility], where she was being given 22 units subcutaneous along with receiving Januvia 100 mg. I suspect with her underlying dementia she may not have been taking enough by mouth which ultimately led to her hypoglycemia. -Early this morning she had another episode of hypoglycemia with blood sugar of 51 -I think he would be prudent to watch her one more day, encourage by mouth intake, hold off on hypoglycemic agents for now and monitor blood sugars. -I think patient will need a supervised feedings as it was my experience with her that she needed to be prompted to eat.   2.  Advanced dementia. -Patient currently resident at skilled nursing facility, having poor functional status, bedbound. -She is pleasant and cooperative, no acute distress. -Anticipate discharge to skilled nursing facility when medically stable  3.  Bradycardia. -She initially had heart rates in the 40s to 50s which I suspect could've been a result of hypoglycemia. -Heart rates improved this morning with telemetry showing pulse in the 60s.  4.  Prolonged QTC -EKG this morning showing prolonged QTC of 507. -Will stop Risperdal -Repeat EKG in a.m.  Code Status: DO NOT RESUSCITATE Family Communication: I tried calling her niece, could not get a hold of her Disposition Plan: Anticipate discharge back to skilled nursing facility in the next 24 hours    HPI/Subjective: Patient is a pleasant 81 year old female with a past medical history of dementia, type 2 diabetes mellitus, poor functional status at baseline, nonambulatory, presented as a transfer from her skilled nursing facility to the emergency department for low blood sugars. It appears that she was in the process of being transitioned her from Lantus to  Toujeo (300 units/mL). Per medication administration record she was being given at 22 units of Toujeo along with 100 mg of Januvia. In the emergency room upon which is found to have low blood sugars in the 50s. After the discontinuation of hypoglycemics and the administration of dextrose sugars improved however dropped again on the following morning to 51.  Objective: Filed Vitals:   09/10/14 1354  BP: 107/93  Pulse: 64  Temp: 98.3 F (36.8 C)  Resp: 16    Intake/Output Summary (Last 24 hours) at 09/10/14 1441 Last data filed at 09/10/14 1426  Gross per 24 hour  Intake    100 ml  Output      0 ml  Net    100 ml   Filed Weights   09/09/14 1101 09/10/14 0411  Weight: 95.255 kg (210 lb) 87.2 kg (192 lb 3.9 oz)    Exam:  General: Alert, Awake and not oriented, pleasant, cooperative, confused Cardiovascular: S1 and S2 Present, no Murmur, Peripheral Pulses Present Respiratory: Normal respiratory effort, lungs are clear to auscultation bilaterally no wheezing rhonchi or rales Abdomen: Bowel Sound present, Soft and non tender Extremities: no Pedal edema, no calf tenderness Neurologic: Grossly no focal neuro deficit.  Data Reviewed: Basic Metabolic Panel:  Recent Labs Lab 09/09/14 1148 09/10/14 0300  NA 143 143  K 4.4 4.1  CL 109 108  CO2 27 31  GLUCOSE 140* 82  BUN 15 15  CREATININE 0.79 0.84  CALCIUM 7.6* 7.4*   Liver Function Tests:  Recent Labs Lab 09/09/14 1148 09/10/14 0300  AST 20 16  ALT 12* 11*  ALKPHOS 67 62  BILITOT 0.4 0.2*  PROT 6.3* 5.4*  ALBUMIN 2.7* 2.3*   No results for input(s): LIPASE, AMYLASE in the last 168 hours. No results for input(s): AMMONIA in the last 168 hours. CBC:  Recent Labs Lab 09/09/14 1148 09/10/14 0300  WBC 6.6 8.4  NEUTROABS 4.6  --   HGB 12.9 10.9*  HCT 38.8 33.6*  MCV 89.4 89.1  PLT 298 270   Cardiac Enzymes: No results for input(s): CKTOTAL, CKMB, CKMBINDEX, TROPONINI in the last 168 hours. BNP (last 3  results) No results for input(s): BNP in the last 8760 hours.  ProBNP (last 3 results) No results for input(s): PROBNP in the last 8760 hours.  CBG:  Recent Labs Lab 09/10/14 0408 09/10/14 0427 09/10/14 0501 09/10/14 0807 09/10/14 1213  GLUCAP 51* 54* 144* 77 137*    No results found for this or any previous visit (from the past 240 hour(s)).   Studies: Dg Chest 2 View  09/09/2014   CLINICAL DATA:  Hyperglycemia and bradycardia.  EXAM: CHEST  2 VIEW  COMPARISON:  01/16/2013  FINDINGS: Lungs are somewhat hypoinflated without consolidation or effusion. There is mild cardiomegaly. There are degenerative changes of the spine. Surgical clips are present over the neck.  IMPRESSION: Hypoinflation without acute cardiopulmonary disease.   Electronically Signed   By: Marin Olp M.D.   On: 09/09/2014 12:10    Scheduled Meds: . donepezil  5 mg Oral QHS  . enoxaparin (LOVENOX) injection  40 mg Subcutaneous Q24H  . escitalopram  10 mg Oral Daily  . ferrous sulfate  325 mg Oral Q breakfast  . memantine  10 mg Oral BID  . protein supplement  1 scoop Oral BID  . risperiDONE  0.25 mg Oral QODAY  . sodium chloride  3 mL Intravenous Q12H   Continuous Infusions: . dextrose 5 % and 0.9% NaCl 10 mL/hr at 09/09/14 2252    Principal Problem:   Hypoglycemia Active Problems:   Anxiety state   Diabetes mellitus type 2, uncontrolled, without complications   Dementia with behavioral disturbance   Dysphagia, oral phase   FTT (failure to thrive) in adult    Time spent: 35 minutes    Kelvin Cellar  [facility] Pager [phone] If 7PM-7AM, please contact night-coverage at [email or web address], password [facility] 09/10/2014, 2:41 PM  LOS: 1 day

## 2014-09-10 NOTE — Progress Notes (Addendum)
Hypoglycemic Event  CBG: 51  Treatment: 8oz juice  Symptoms: n/a    Follow-up CBG: Time 0423 CBG Result:54  Comments/MD notified: NP callahan paged/notified.  1 amp D50 ordered/given.  Follow-up CBG:  [place]

## 2014-09-10 NOTE — Clinical Social Work Note (Signed)
Clinical Social Work Assessment  Patient Details  Name: Jill Francis MRN: [id number] Date of Birth: 1927-12-12  Date of referral:  09/10/14               Reason for consult:  Facility Placement, Discharge Planning                Permission sought to share information with:  Facility Sport and exercise psychologist, Family Supports Permission granted to share information::  Yes, Verbal Permission Granted  Name::     Financial planner::  [facility]  Relationship::     Contact Information:     Housing/Transportation Living arrangements for the past 2 months:  [place] of Information:  Other (Comment Required) (Niece) Patient Interpreter Needed:  None Criminal Activity/Legal Involvement Pertinent to Current Situation/Hospitalization:  No - Comment as needed Significant Relationships:  Other(Comment) (Niece) Lives with:  Facility Resident Do you feel safe going back to the place where you live?  Yes Need for family participation in patient care:  Yes (Comment)  Care giving concerns:  Patient's niece plans for the patient to return to [facility] at discharge where she has been a resident for 4 years. She reports that she is not happy with the care the patient receives at the facility but is afraid to move the patient because the patient has become attached to her roommate.    Social Worker assessment / plan:  CSW attempted to meet with patient and family at bedside. No family currently at bedside and the patient is confused. CSW received return phone call from patient's niece Jill Francis. Jill Francis states that the patient was admitted from [facility] and the plan is for the patient to return to the facility once medically stable for DC. She does report concerns about the care the patient receives at the facility but she states she has made her concerns well know to the facility and administrators. CSW explained role and process of getting the patient back to the facility once ready for  DC.  Employment status:  Disabled (Comment on whether or not currently receiving Disability) Insurance information:  Programmer, applications, Medicaid In [place] PT Recommendations:  [place] / Referral to community resources:  [place]  Patient/Family's Response to care:  Family is happy with the care the patient is receiving in the hospital but states they believe her care could be better at [facility].   Patient/Family's Understanding of and Emotional Response to Diagnosis, Current Treatment, and Prognosis:  Patient has poor insight into reason for admission as she is confused. Patient's family appears to have fair insight into reason for admission and understands the patient's post DC needs.  Emotional Assessment Appearance:  Appears stated age Attitude/Demeanor/Rapport:  Other (Patient is confused.) Affect (typically observed):  Flat Orientation:  Oriented to Self Alcohol / Substance use:  Tobacco Use (Hx of) Psych involvement (Current and /or in the community):  No (Comment)  Discharge Needs  Concerns to be addressed:  Discharge Planning Concerns Readmission within the last 30 days:  No Current discharge risk:  Cognitively Impaired, Physical Impairment Barriers to Discharge:  Continued Medical Work up   [employer] MSW, [place], [place], [phone]

## 2014-09-10 NOTE — Clinical Social Work Note (Signed)
Messages left with family in attempt to complete assessment as no family currently at bedside and patient is confused. CSW notes that patient is from [facility]. Will go ahead and complete FL2.    Liz Beach MSW, [place], [place], [phone]

## 2014-09-11 LAB — GLUCOSE, CAPILLARY
GLUCOSE-CAPILLARY: 84 mg/dL (ref 65–99)
GLUCOSE-CAPILLARY: 130 mg/dL — AB (ref 65–99)
Glucose-Capillary: 139 mg/dL — ABNORMAL HIGH (ref 65–99)
Glucose-Capillary: 238 mg/dL — ABNORMAL HIGH (ref 65–99)
Glucose-Capillary: 51 mg/dL — ABNORMAL LOW (ref 65–99)
Glucose-Capillary: 67 mg/dL (ref 65–99)
Glucose-Capillary: 68 mg/dL (ref 65–99)
Glucose-Capillary: 82 mg/dL (ref 65–99)

## 2014-09-11 LAB — BASIC METABOLIC PANEL
Anion gap: 3 — ABNORMAL LOW (ref 5–15)
BUN: 12 mg/dL (ref 6–20)
CALCIUM: 7.3 mg/dL — AB (ref 8.9–10.3)
CHLORIDE: 108 mmol/L (ref 101–111)
CO2: 31 mmol/L (ref 22–32)
CREATININE: 0.84 mg/dL (ref 0.44–1.00)
GFR calc non Af Amer: >60 mL/min (ref >60)
Glucose, Bld: 95 mg/dL (ref 65–99)
Potassium: 4.1 mmol/L (ref 3.5–5.1)
SODIUM: 142 mmol/L (ref 135–145)

## 2014-09-11 MED ORDER — GLUCOSE 40 % PO GEL
ORAL | Status: AC
  Administered 2014-09-11: 37.5 g
  Filled 2014-09-11: qty 1

## 2014-09-11 NOTE — Care Management (Signed)
Important Message  Patient Details  Name: Jill Francis MRN: [id number] Date of Birth: 03-May-1927   Medicare Important Message Given:  Yes-second notification given    Maryclare Labrador, RN 09/11/2014, 11:12 AM

## 2014-09-11 NOTE — Progress Notes (Signed)
4AM CBG 82.  6oz grape juice given.  Will continue to monitor pt closely. Claudette Stapler, RN

## 2014-09-11 NOTE — Discharge Summary (Signed)
Physician Discharge Summary  Jill Francis HEN:[id number] DOB: [birth year] DOA: 09/09/2014  PCP: No primary care provider on file.  Admit date: 09/09/2014 Discharge date: 09/11/2014  Time spent: 35 minutes  Recommendations for Outpatient Follow-up:  1. Please follow up on blood sugars, she was admitted for hypoglycemia, Insulin and Januvia stopped. During this hospitalization blood sugars improving with feeding. She has a history of advanced dementia and needed to be prompted to eat during this hospitalization. Please ensure that she gets feed at every meal.   2. For now will stop Insulin and Januvia, follow blood sugars 3. Patient discharged back to her SNF  Discharge Diagnoses:  Principal Problem:   Hypoglycemia Active Problems:   Anxiety state   Diabetes mellitus type 2, uncontrolled, without complications   Dementia with behavioral disturbance   Dysphagia, oral phase   FTT (failure to thrive) in adult   Discharge Condition: Stable  Diet recommendation: Heart Healthy  Filed Weights   09/09/14 1101 09/10/14 0411 09/11/14 0500  Weight: 95.255 kg (210 lb) 87.2 kg (192 lb 3.9 oz) 86.7 kg (191 lb 2.2 oz)    History of present illness:  Jill Francis is a 81 y.o. female with Past medical history of diabetes mellitus type 2 on insulin, Alzheimer's dementia, depression, chronic bedridden state. The patient is presenting with complaints of low blood sugar. The patient is pleasantly confused and is unable to provide any history. At her baseline the patient is mostly in the bed and ambulates with a wheelchair. The patient is also speaking out of context and also incoherently at her baseline. She does not move significantly her left leg at her baseline. Patient has been having low blood sugars since this morning along with that she was also found to be having low heart rate. With that the patient was brought here. There was no nausea vomiting diarrhea or fall reported. At the time  of my evaluation the daughter was at her bedside who manages the patient is at her baseline mentally. No fever no chills reported. Chest x-ray is clear. EKG shows atrial fibrillation with bradycardia. Patient was initially planned to be discharged back to the nursing home after her sugar improved and later on she dropped  Theback to 60s and therefore was requested to be admitted. At the time of my evaluation her sugars were 223. Nursing home was in the presence of transitioning her from Lantus to [facility].  patient is coming from SNF.  At her baseline ambulates wheelchair and essentially bedridden And is dependent for most of her ADL does not manages her medication on her own.  Hospital Course:  Patient is a pleasant 81 year old female with a past medical history of dementia, type 2 diabetes mellitus, poor functional status at baseline, nonambulatory, presented as a transfer from her skilled nursing facility to the emergency department for low blood sugars. It appears that she was in the process of being transitioned her from Lantus to Toujeo (300 units/mL). Per medication administration record she was being given at 22 units of Toujeo along with 100 mg of Januvia. In the emergency room upon which is found to have low blood sugars in the 50s. After the discontinuation of hypoglycemics and the administration of dextrose sugars improved however dropped again on the following morning to 51.  1. Hypoglycemia -I think likely secondary to recent transition to [facility], where she was being given 22 units subcutaneous along with receiving Januvia 100 mg. I suspect with her underlying dementia she may not  have been taking enough by mouth which ultimately led to her hypoglycemia. -I think patient will need a supervised feedings as it was my experience with her that she needed to be prompted to eat.  -Please follow up on blood sugars. Insulin and Januvia was stopped on discharge  2. Advanced  dementia. -Patient currently resident at skilled nursing facility, having poor functional status, bedbound. -She is pleasant and cooperative, no acute distress. -Anticipate discharge to skilled nursing facility when medically stable  3. Bradycardia. -She initially had heart rates in the 40s to 50s which I suspect could've been a result of hypoglycemia. -Heart rates improved this morning with telemetry showing pulse in the 60s.  4. Prolonged QTC -EKG this morning showing prolonged QTC of 507. -Stopped Risperdal    Discharge Exam: Filed Vitals:   09/11/14 0433  BP: 114/46  Pulse: 48  Temp: 97.7 F (36.5 C)  Resp: 16    General: Alert, Awake and not oriented, pleasant, cooperative, confused Cardiovascular: S1 and S2 Present, no Murmur, Peripheral Pulses Present Respiratory: Normal respiratory effort, lungs are clear to auscultation bilaterally no wheezing rhonchi or rales Abdomen: Bowel Sound present, Soft and non tender Extremities: no Pedal edema, no calf tenderness Neurologic: Grossly no focal neuro deficit.  Discharge Instructions   Discharge Instructions    Call MD for:  difficulty breathing, headache or visual disturbances    Complete by:  As directed      Call MD for:  extreme fatigue    Complete by:  As directed      Call MD for:  hives    Complete by:  As directed      Call MD for:  persistant dizziness or light-headedness    Complete by:  As directed      Call MD for:  persistant nausea and vomiting    Complete by:  As directed      Call MD for:  redness, tenderness, or signs of infection (pain, swelling, redness, odor or green/yellow discharge around incision site)    Complete by:  As directed      Call MD for:  severe uncontrolled pain    Complete by:  As directed      Call MD for:  temperature >100.4    Complete by:  As directed      Call MD for:    Complete by:  As directed      Diet - low sodium heart healthy    Complete by:  As directed       Increase activity slowly    Complete by:  As directed           Current Discharge Medication List    CONTINUE these medications which have NOT CHANGED   Details  calcium-vitamin D (OSCAL WITH D) 500-200 MG-UNIT per tablet Take 1 tablet by mouth 3 (three) times daily.    donepezil (ARICEPT) 10 MG tablet Take 10 mg by mouth at bedtime. For dementia    escitalopram (LEXAPRO) 10 MG tablet Take 10 mg by mouth daily. For depression    ferrous sulfate 325 (65 FE) MG tablet Take 325 mg by mouth daily with breakfast.    memantine (NAMENDA) 10 MG tablet Take 10 mg by mouth 2 (two) times daily. For dementia    mirtazapine (REMERON) 15 MG tablet Take 15 mg by mouth at bedtime.    risperiDONE (RISPERDAL) 0.25 MG tablet Take 0.25 mg by mouth at bedtime.    triazolam (HALCION) 0.25 MG tablet  Take 0.25 mg by mouth at bedtime as needed (Prior to dental procedure as directed by the [facility]).    protein supplement (RESOURCE BENEPROTEIN) POWD Take 1 scoop by mouth 2 (two) times daily.      STOP taking these medications     Insulin Glargine (TOUJEO SOLOSTAR) 300 UNIT/ML SOPN      sitaGLIPtin (JANUVIA) 100 MG tablet        Allergies  Allergen Reactions  . Gabapentin     Per mar  . Iodine     Per mar  . Sulfonamide Derivatives     Per mar   Follow-up Information    Follow up with Cyndee Brightly, MD In 1 day.   Specialty:  Internal Medicine   Why:  For Reevaluation of bradycardia   Contact information:   [place] [address] [phone]       Follow up with [facility].   Specialty:  Emergency Medicine   Why:  Return to ER for worsening conditions., Fever, difficulty breathing, chest pain, numbness   Contact information:   [address] [id number] [place] [phone]       The results of significant diagnostics from this hospitalization (including imaging,  microbiology, ancillary and laboratory) are listed below for reference.    Significant Diagnostic Studies: Dg Chest 2 View  09/09/2014   CLINICAL DATA:  Hyperglycemia and bradycardia.  EXAM: CHEST  2 VIEW  COMPARISON:  01/16/2013  FINDINGS: Lungs are somewhat hypoinflated without consolidation or effusion. There is mild cardiomegaly. There are degenerative changes of the spine. Surgical clips are present over the neck.  IMPRESSION: Hypoinflation without acute cardiopulmonary disease.   Electronically Signed   By: Marin Olp M.D.   On: 09/09/2014 12:10    Microbiology: No results found for this or any previous visit (from the past 240 hour(s)).   Labs: Basic Metabolic Panel:  Recent Labs Lab 09/09/14 1148 09/10/14 0300 09/11/14 0405  NA 143 143 142  K 4.4 4.1 4.1  CL 109 108 108  CO2 27 31 31   GLUCOSE 140* 82 95  BUN 15 15 12   CREATININE 0.79 0.84 0.84  CALCIUM 7.6* 7.4* 7.3*   Liver Function Tests:  Recent Labs Lab 09/09/14 1148 09/10/14 0300  AST 20 16  ALT 12* 11*  ALKPHOS 67 62  BILITOT 0.4 0.2*  PROT 6.3* 5.4*  ALBUMIN 2.7* 2.3*   No results for input(s): LIPASE, AMYLASE in the last 168 hours. No results for input(s): AMMONIA in the last 168 hours. CBC:  Recent Labs Lab 09/09/14 1148 09/10/14 0300  WBC 6.6 8.4  NEUTROABS 4.6  --   HGB 12.9 10.9*  HCT 38.8 33.6*  MCV 89.4 89.1  PLT 298 270   Cardiac Enzymes: No results for input(s): CKTOTAL, CKMB, CKMBINDEX, TROPONINI in the last 168 hours. BNP: BNP (last 3 results) No results for input(s): BNP in the last 8760 hours.  ProBNP (last 3 results) No results for input(s): PROBNP in the last 8760 hours.  CBG:  Recent Labs Lab 09/11/14 0046 09/11/14 0109 09/11/14 0416 09/11/14 0818 09/11/14 0838  GLUCAP 68 139* 82 51* 84       Signed:  Sheriann Newmann  [facility] 09/11/2014, 9:54 AM

## 2014-09-11 NOTE — Progress Notes (Signed)
09/11/2014 11:59 AM Pt d/c'd to [facility] via [facility].  Report called to Ann At the facility and Pt's niece notified of the transfer. [place]

## 2014-09-11 NOTE — Care Management (Signed)
Important Message  Patient Details  Name: Jill Francis MRN: [id number] Date of Birth: 1927/06/12   Medicare Important Message Given:  Yes-second notification given    Nathen May 09/11/2014, 1:06 PM

## 2014-09-11 NOTE — Progress Notes (Signed)
Hypoglycemic Event  CBG: 67  Treatment: 4oz juice  Symptoms:  n/a  Follow-up CBG: Time: 4834 CBG Result:68  Treatment: glutose oral gel  Follow-up CBG time: 0109     CBG Result:139  Jill Francis B

## 2014-09-11 NOTE — Progress Notes (Signed)
Patient will discharge to [facility] Anticipated discharge date:09/11/14 Family notified:niece Transportation by [employer]- called at 11:15am  CSW signing off.  Domenica Reamer, [place] Social Worker [phone]

## 2014-09-13 ENCOUNTER — Non-Acute Institutional Stay: Payer: Medicare HMO | Admitting: Internal Medicine

## 2014-09-13 ENCOUNTER — Encounter: Payer: Self-pay | Admitting: Internal Medicine

## 2014-09-13 DIAGNOSIS — F0391 Unspecified dementia with behavioral disturbance: Secondary | ICD-10-CM

## 2014-09-13 DIAGNOSIS — I4581 Long QT syndrome: Secondary | ICD-10-CM | POA: Diagnosis not present

## 2014-09-13 DIAGNOSIS — E162 Hypoglycemia, unspecified: Secondary | ICD-10-CM | POA: Diagnosis not present

## 2014-09-13 DIAGNOSIS — R627 Adult failure to thrive: Secondary | ICD-10-CM | POA: Diagnosis not present

## 2014-09-13 DIAGNOSIS — R001 Bradycardia, unspecified: Secondary | ICD-10-CM | POA: Diagnosis not present

## 2014-09-13 DIAGNOSIS — F29 Unspecified psychosis not due to a substance or known physiological condition: Secondary | ICD-10-CM | POA: Diagnosis not present

## 2014-09-13 DIAGNOSIS — R9431 Abnormal electrocardiogram [ECG] [EKG]: Secondary | ICD-10-CM

## 2014-09-13 DIAGNOSIS — F03918 Unspecified dementia, unspecified severity, with other behavioral disturbance: Secondary | ICD-10-CM

## 2014-09-13 NOTE — Progress Notes (Signed)
MRN: [id number] Name: Jill Francis  Sex: female Age: 81 y.o. DOB: 1927-09-17  [facility] [facility]: [facility] Facility/Room:216 Level Of Care: SNF Provider: Inocencio Homes D Emergency Contacts: Extended Emergency Contact Information Primary Emergency Contact: [name] [place] of [place] Phone: [phone] Work Phone: [phone] Mobile Phone: [phone] Relation: Niece Secondary Emergency Contact: [place]  [place] Phone: [phone] Relation: None  Code Status:   Allergies: Gabapentin; Iodine; and Sulfonamide derivatives  Chief Complaint  Patient presents with  . Readmit To SNF    HPI: Patient is 81 y.o. female who is admitted to SNF after being hospitalized with hypoglycemia, uncomplicated, along with bradycardia that resolved on its own.  Past Medical History  Diagnosis Date  . Cancer     Skin cancer  . Diabetes mellitus   . Depression   . Asthma   . Alzheimer's disease 08/19/2012  . Type II or unspecified type diabetes mellitus without mention of complication, uncontrolled 08/19/2012  . Hyperlipidemia     Past Surgical History  Procedure Laterality Date  . Knee surgery    . Skin cancer excision    . Back surgery    . Breast surgery  right partial mastectomy  06/2003      Medication List       This list is accurate as of: 09/13/14 11:59 PM.  Always use your most recent med list.               calcium-vitamin D 500-200 MG-UNIT per tablet  Commonly known as:  OSCAL WITH D  Take 1 tablet by mouth 3 (three) times daily.     donepezil 10 MG tablet  Commonly known as:  ARICEPT  Take 10 mg by mouth at bedtime. For dementia     escitalopram 10 MG tablet  Commonly known as:  LEXAPRO  Take 10 mg by mouth daily. For depression     ferrous sulfate 325 (65 FE) MG tablet  Take 325 mg by mouth daily with breakfast.     memantine 10 MG tablet  Commonly known as:  NAMENDA  Take 10 mg by mouth 2 (two) times daily. For  dementia     mirtazapine 15 MG tablet  Commonly known as:  REMERON  Take 15 mg by mouth at bedtime.     protein supplement Powd  Take 1 scoop by mouth 2 (two) times daily.     risperiDONE 0.25 MG tablet  Commonly known as:  RISPERDAL  Take 0.25 mg by mouth at bedtime.     triazolam 0.25 MG tablet  Commonly known as:  HALCION  Take 0.25 mg by mouth at bedtime as needed (Prior to dental procedure as directed by the [facility]).        No orders of the defined types were placed in this encounter.    Immunization History  Administered Date(s) Administered  . Influenza Whole 12/27/2012  . Influenza-Unspecified 12/27/2013  . PPD Test 06/05/2009  . Pneumococcal Polysaccharide-23 01/17/2013    History  Substance Use Topics  . Smoking status: Former Research scientist (life sciences)  . Smokeless tobacco: Not on file  . Alcohol Use: No    Family history is noncontributory    Review of Systems  DATA OBTAINED: from patient, mostly nurse GENERAL:  no fevers, fatigue, appetite changes SKIN: No itching, rash  EYES: No eye pain, redness, discharge EARS: No earache, tinnitus, change in hearing NOSE: No congestion, drainage or bleeding  MOUTH/THROAT: No mouth or tooth pain,  No sore throat RESPIRATORY: No cough, wheezing, SOB CARDIAC: No chest pain, palpitations, lower extremity edema  GI: No abdominal pain, No N/V/D or constipation, No heartburn or reflux  GU: No dysuria, frequency or urgency, or incontinence  MUSCULOSKELETAL: No unrelieved bone/joint pain NEUROLOGIC: No headache, dizziness or focal weakness PSYCHIATRIC: No overt anxiety or sadness, No behavior issue.   Filed Vitals:   09/13/14 2009  BP: 129/65  Pulse: 76  Temp: 97 F (36.1 C)  Resp: 18    Physical Exam  GENERAL APPEARANCE: Alert  No acute distress.  SKIN: No diaphoresis rash HEAD: Normocephalic, atraumatic  EYES: Conjunctiva/lids clear. Pupils round, reactive. EOMs intact.  EARS: External exam WNL, canals  clear. Hearing grossly normal.  NOSE: No deformity or discharge.  MOUTH/THROAT: Lips w/o lesions  RESPIRATORY: Breathing is even, unlabored. Lung sounds are clear   CARDIOVASCULAR: Heart RRR no murmurs, rubs or gallops. No peripheral edema.   GASTROINTESTINAL: Abdomen is soft, non-tender, not distended w/ normal bowel sounds. GENITOURINARY: Bladder non tender, not distended  MUSCULOSKELETAL: No abnormal joints or musculature NEUROLOGIC:  Cranial nerves 2-12 grossly intact PSYCHIATRIC: dementia, no behavioral issues  Patient Active Problem List   Diagnosis Date Noted  . Bradycardia 09/18/2014  . Prolonged QT interval 09/18/2014  . Hypocalcemia 05/27/2014  . FTT (failure to thrive) in adult 05/27/2014  . Dysphagia, oral phase 04/26/2014  . CKD (chronic kidney disease) stage 2, GFR 60-89 ml/min 04/26/2014  . Type 2 diabetes mellitus without complication 03/19/7251  . Hyperlipidemia   . Chronic renal disease, stage 2, mildly decreased glomerular filtration rate between 60-89 mL/min/1.73 square meter 10/14/2013  . Cough 06/29/2013  . Hypoglycemia 01/16/2013  . Psychosis 09/20/2012  . Diabetes mellitus type 2, uncontrolled, without complications 66/44/0347  . Dementia with behavioral disturbance 08/19/2012  . Iron deficiency anemia 08/19/2012  . Depression 08/19/2012  . Malignant neoplasm of female breast 06/30/2008  . ADENOCARCINOMA, OVARY 06/29/2008  . Anxiety state 01/16/2007    CBC    Component Value Date/Time   WBC 8.4 09/10/2014 0300   WBC 5.9 04/27/2014   WBC 4.8 10/05/2008 1424   RBC 3.77* 09/10/2014 0300   RBC 3.84 10/05/2008 1424   HGB 10.9* 09/10/2014 0300   HGB 11.5* 10/05/2008 1424   HCT 33.6* 09/10/2014 0300   HCT 34.6* 10/05/2008 1424   PLT 270 09/10/2014 0300   PLT 262 10/05/2008 1424   MCV 89.1 09/10/2014 0300   MCV 90.1 10/05/2008 1424   LYMPHSABS 1.4 09/09/2014 1148   LYMPHSABS 1.4 10/05/2008 1424   MONOABS 0.4 09/09/2014 1148   MONOABS 0.3 10/05/2008  1424   EOSABS 0.1 09/09/2014 1148   EOSABS 0.1 10/05/2008 1424   BASOSABS 0.0 09/09/2014 1148   BASOSABS 0.0 10/05/2008 1424    CMP     Component Value Date/Time   NA 142 09/11/2014 0405   NA 140 04/27/2014   K 4.1 09/11/2014 0405   CL 108 09/11/2014 0405   CO2 31 09/11/2014 0405   GLUCOSE 95 09/11/2014 0405   BUN 12 09/11/2014 0405   BUN 10 04/27/2014   CREATININE 0.84 09/11/2014 0405   CALCIUM 7.3* 09/11/2014 0405   PROT 5.4* 09/10/2014 0300   ALBUMIN 2.3* 09/10/2014 0300   AST 16 09/10/2014 0300   ALT 11* 09/10/2014 0300   ALKPHOS 62 09/10/2014 0300   BILITOT 0.2* 09/10/2014 0300   GFRNONAA >60 09/11/2014 0405   GFRAA >60 09/11/2014 0405    Assessment and Plan  Hypoglycemia -I think likely secondary  to recent transition to [facility], where she was being given 22 units subcutaneous along with receiving Januvia 100 mg. I suspect with her underlying dementia she may not have been taking enough by mouth which ultimately led to her hypoglycemia. -I think patient will need a supervised feedings as it was my experience with her that she needed to be prompted to eat.  -Please follow up on blood sugars. Insulin and Januvia was stopped on discharge   Dementia with behavioral disturbance Patient currently resident at skilled nursing facility, having poor functional status, bedbound, FTT -She is pleasant and cooperative, no acute distress. -Anticipate discharge to skilled nursing facility when medically stable   FTT (failure to thrive) in adult 2/2 advancing dementia  Bradycardia She initially had heart rates in the 40s to 50s which I suspect could've been a result of hypoglycemia. -Heart rates improved this morning with telemetry showing pulse in the 60s.   Prolonged QT interval EKG morning of d/c showing prolonged QTC of 507. -Stopped Risperdal   Psychosis Risperdal stopped 2/2 prolonged QT    Hennie Duos, MD

## 2014-09-18 DIAGNOSIS — R001 Bradycardia, unspecified: Secondary | ICD-10-CM | POA: Insufficient documentation

## 2014-09-18 DIAGNOSIS — R9431 Abnormal electrocardiogram [ECG] [EKG]: Secondary | ICD-10-CM | POA: Insufficient documentation

## 2014-09-18 NOTE — Assessment & Plan Note (Signed)
EKG morning of d/c showing prolonged QTC of 507. -Stopped Risperdal

## 2014-09-18 NOTE — Assessment & Plan Note (Signed)
Patient currently resident at skilled nursing facility, having poor functional status, bedbound, FTT -She is pleasant and cooperative, no acute distress. -Anticipate discharge to skilled nursing facility when medically stable

## 2014-09-18 NOTE — Assessment & Plan Note (Signed)
Risperdal stopped 2/2 prolonged QT

## 2014-09-18 NOTE — Assessment & Plan Note (Signed)
-  I think likely secondary to recent transition to [facility], where she was being given 22 units subcutaneous along with receiving Januvia 100 mg. I suspect with her underlying dementia she may not have been taking enough by mouth which ultimately led to her hypoglycemia. -I think patient will need a supervised feedings as it was my experience with her that she needed to be prompted to eat.  -Please follow up on blood sugars. Insulin and Januvia was stopped on discharge

## 2014-09-18 NOTE — Assessment & Plan Note (Signed)
She initially had heart rates in the 40s to 50s which I suspect could've been a result of hypoglycemia. -Heart rates improved this morning with telemetry showing pulse in the 60s.

## 2014-09-18 NOTE — Assessment & Plan Note (Signed)
2/2 advancing dementia

## 2014-09-22 ENCOUNTER — Non-Acute Institutional Stay (SKILLED_NURSING_FACILITY): Payer: Medicare HMO | Admitting: Internal Medicine

## 2014-09-22 ENCOUNTER — Encounter: Payer: Self-pay | Admitting: Internal Medicine

## 2014-09-22 DIAGNOSIS — E119 Type 2 diabetes mellitus without complications: Secondary | ICD-10-CM

## 2014-09-22 DIAGNOSIS — F29 Unspecified psychosis not due to a substance or known physiological condition: Secondary | ICD-10-CM | POA: Diagnosis not present

## 2014-09-22 DIAGNOSIS — D509 Iron deficiency anemia, unspecified: Secondary | ICD-10-CM

## 2014-09-22 NOTE — Progress Notes (Signed)
MRN: [id number] Name: Jill Francis  Sex: female Age: 81 y.o. DOB: 19-Jan-1928  [place] #: [facility] Facility/Room:216 Level Of Care: SNF Provider: Inocencio Homes D Emergency Contacts: Extended Emergency Contact Information Primary Emergency Contact: [name] [place] of [place] Phone: [phone] Work Phone: [phone] Mobile Phone: [phone] Relation: Niece Secondary Emergency Contact: [place] Phone: [phone] Relation: None  Code Status: DNR  Allergies: Gabapentin; Iodine; and Sulfonamide derivatives  Chief Complaint  Patient presents with  . Acute Visit    HPI: Patient is 81 y.o. female who nursing asked me to see today because her BS are running high every day since pt was d/c from hospital. Pt was hospitalized from 6/25-27 for hypoglycemia and all of her meds for DM were stopped. On being readmitted to SNF pt's BS have been running high, in the 200's and 300's. Pt is being fed so po intake is adequate to good. Pt is also being seen for psychoses and iron def anemia.  Past Medical History  Diagnosis Date  . Cancer     Skin cancer  . Diabetes mellitus   . Depression   . Asthma   . Alzheimer's disease 08/19/2012  . Type II or unspecified type diabetes mellitus without mention of complication, uncontrolled 08/19/2012  . Hyperlipidemia     Past Surgical History  Procedure Laterality Date  . Knee surgery    . Skin cancer excision    . Back surgery    . Breast surgery  right partial mastectomy  06/2003      Medication List       This list is accurate as of: 09/22/14 11:59 PM.  Always use your most recent med list.               calcium-vitamin D 500-200 MG-UNIT per tablet  Commonly known as:  OSCAL WITH D  Take 1 tablet by mouth 3 (three) times daily.     donepezil 10 MG tablet  Commonly known as:  ARICEPT  Take 10 mg by mouth at bedtime. For dementia     escitalopram 10 MG tablet  Commonly  known as:  LEXAPRO  Take 10 mg by mouth daily. For depression     ferrous sulfate 325 (65 FE) MG tablet  Take 325 mg by mouth daily with breakfast.     memantine 10 MG tablet  Commonly known as:  NAMENDA  Take 10 mg by mouth 2 (two) times daily. For dementia     mirtazapine 15 MG tablet  Commonly known as:  REMERON  Take 15 mg by mouth at bedtime.     protein supplement Powd  Take 1 scoop by mouth 2 (two) times daily.     risperiDONE 0.25 MG tablet  Commonly known as:  RISPERDAL  Take 0.25 mg by mouth at bedtime.     triazolam 0.25 MG tablet  Commonly known as:  HALCION  Take 0.25 mg by mouth at bedtime as needed (Prior to dental procedure as directed by the [facility]).        No orders of the defined types were placed in this encounter.    Immunization History  Administered Date(s) Administered  . Influenza Whole 12/27/2012  . Influenza-Unspecified 12/27/2013  . PPD Test 06/05/2009  . Pneumococcal Polysaccharide-23 01/17/2013    History  Substance Use Topics  . Smoking status: Former Research scientist (life sciences)  . Smokeless tobacco: Not on file  . Alcohol Use: No  Review of Systems  DATA OBTAINED: from patient, nurse GENERAL:  no fevers, fatigue, appetite changes SKIN: No itching, rash HEENT: No complaint RESPIRATORY: No cough, wheezing, SOB CARDIAC: No chest pain, palpitations, lower extremity edema  GI: No abdominal pain, No N/V/D or constipation, No heartburn or reflux  GU: No dysuria, frequency or urgency, or incontinence  MUSCULOSKELETAL: No unrelieved bone/joint pain NEUROLOGIC: No headache, dizziness  PSYCHIATRIC: No overt anxiety or sadness  Filed Vitals:   09/22/14 1440  BP: 118/61  Pulse: 69  Temp: 98.1 F (36.7 C)  Resp: 18    Physical Exam  GENERAL APPEARANCE: Alert, min conversant, No acute distress BF in WC,  SKIN: No diaphoresis rash HEENT: Unremarkable RESPIRATORY: Breathing is even, unlabored. Lung sounds are clear    CARDIOVASCULAR: Heart RRR no murmurs, rubs or gallops. No peripheral edema  GASTROINTESTINAL: Abdomen is soft, non-tender, not distended w/ normal bowel sounds.  GENITOURINARY: Bladder non tender, not distended  MUSCULOSKELETAL: No abnormal joints or musculature NEUROLOGIC: Cranial nerves 2-12 grossly intact PSYCHIATRIC: Mood and affect appropriate to situation, pleasant dementia, no behavioral issues  Patient Active Problem List   Diagnosis Date Noted  . Bradycardia 09/18/2014  . Prolonged QT interval 09/18/2014  . Hypocalcemia 05/27/2014  . FTT (failure to thrive) in adult 05/27/2014  . Dysphagia, oral phase 04/26/2014  . CKD (chronic kidney disease) stage 2, GFR 60-89 ml/min 04/26/2014  . Type 2 diabetes mellitus without complication 84/69/6295  . Hyperlipidemia   . Chronic renal disease, stage 2, mildly decreased glomerular filtration rate between 60-89 mL/min/1.73 square meter 10/14/2013  . Cough 06/29/2013  . Hypoglycemia 01/16/2013  . Psychosis 09/20/2012  . Diabetes mellitus type 2, uncontrolled, without complications 28/41/3244  . Dementia with behavioral disturbance 08/19/2012  . Iron deficiency anemia 08/19/2012  . Depression 08/19/2012  . Malignant neoplasm of female breast 06/30/2008  . ADENOCARCINOMA, OVARY 06/29/2008  . Anxiety state 01/16/2007    CBC    Component Value Date/Time   WBC 8.4 09/10/2014 0300   WBC 5.9 04/27/2014   WBC 4.8 10/05/2008 1424   RBC 3.77* 09/10/2014 0300   RBC 3.84 10/05/2008 1424   HGB 10.9* 09/10/2014 0300   HGB 11.5* 10/05/2008 1424   HCT 33.6* 09/10/2014 0300   HCT 34.6* 10/05/2008 1424   PLT 270 09/10/2014 0300   PLT 262 10/05/2008 1424   MCV 89.1 09/10/2014 0300   MCV 90.1 10/05/2008 1424   LYMPHSABS 1.4 09/09/2014 1148   LYMPHSABS 1.4 10/05/2008 1424   MONOABS 0.4 09/09/2014 1148   MONOABS 0.3 10/05/2008 1424   EOSABS 0.1 09/09/2014 1148   EOSABS 0.1 10/05/2008 1424   BASOSABS 0.0 09/09/2014 1148   BASOSABS 0.0  10/05/2008 1424    CMP     Component Value Date/Time   NA 142 09/11/2014 0405   NA 140 04/27/2014   K 4.1 09/11/2014 0405   CL 108 09/11/2014 0405   CO2 31 09/11/2014 0405   GLUCOSE 95 09/11/2014 0405   BUN 12 09/11/2014 0405   BUN 10 04/27/2014   CREATININE 0.84 09/11/2014 0405   CALCIUM 7.3* 09/11/2014 0405   PROT 5.4* 09/10/2014 0300   ALBUMIN 2.3* 09/10/2014 0300   AST 16 09/10/2014 0300   ALT 11* 09/10/2014 0300   ALKPHOS 62 09/10/2014 0300   BILITOT 0.2* 09/10/2014 0300   GFRNONAA >60 09/11/2014 0405   GFRAA >60 09/11/2014 0405    Assessment and Plan  Type 2 diabetes mellitus without complication Since admission back to SNF pt has  been started back on her [place]. She was started on toujeo 10 u daily but nursing report elevated BS. Fasting BS, ie am BS have been 100 to 344. With hx hypoglycemia in am not willing to inc basal insulin.Reported pt doesn't usually eat breakfast, BS at lunch and dinner 200-300s. Will start 5 u regular with lunch and dinner and cont monitor.  Psychosis Pt's risperdol was d/c 2/2 prolonged QT and pt has not had any psychotic exacerbation.  Iron deficiency anemia Pt has been on iron since 04/2014. Hb has remained stable, last was 10.6.    Hennie Duos, MD

## 2014-09-27 ENCOUNTER — Encounter: Payer: Self-pay | Admitting: Internal Medicine

## 2014-09-27 NOTE — Assessment & Plan Note (Signed)
Pt has been on iron since 04/2014. Hb has remained stable, last was 10.6.

## 2014-09-27 NOTE — Assessment & Plan Note (Signed)
Since admission back to SNF pt has been started back on her [place]. She was started on toujeo 10 u daily but nursing report elevated BS. Fasting BS, ie am BS have been 100 to 344. With hx hypoglycemia in am not willing to inc basal insulin.Reported pt doesn't usually eat breakfast, BS at lunch and dinner 200-300s. Will start 5 u regular with lunch and dinner and cont monitor.

## 2014-09-27 NOTE — Assessment & Plan Note (Signed)
Pt's risperdol was d/c 2/2 prolonged QT and pt has not had any psychotic exacerbation.

## 2014-11-15 ENCOUNTER — Non-Acute Institutional Stay (SKILLED_NURSING_FACILITY): Payer: Medicare HMO | Admitting: Internal Medicine

## 2014-11-15 DIAGNOSIS — E119 Type 2 diabetes mellitus without complications: Secondary | ICD-10-CM | POA: Diagnosis not present

## 2014-11-15 DIAGNOSIS — F329 Major depressive disorder, single episode, unspecified: Secondary | ICD-10-CM

## 2014-11-15 DIAGNOSIS — N182 Chronic kidney disease, stage 2 (mild): Secondary | ICD-10-CM

## 2014-11-15 DIAGNOSIS — F32A Depression, unspecified: Secondary | ICD-10-CM

## 2014-11-15 NOTE — Progress Notes (Signed)
MRN: [id number] Name: Jill Francis  Sex: female Age: 81 y.o. DOB: 20-May-1927  [place] #: [facility] Facility/Room:216 Level Of Care: SNF Provider: Inocencio Homes D Emergency Contacts: Extended Emergency Contact Information Primary Emergency Contact: [name] [place] of [place] Phone: [phone] Work Phone: [phone] Mobile Phone: [phone] Relation: Niece Secondary Emergency Contact: [place]  [place] Phone: [phone] Relation: None  Code Status:   Allergies: Gabapentin; Iodine; and Sulfonamide derivatives  Chief Complaint  Patient presents with  . Medical Management of Chronic Issues    HPI: Patient is 81 y.o. female with approaching endstage dementia who is being seen for DM2, CRD stage 2, and depression.  Past Medical History  Diagnosis Date  . Cancer     Skin cancer  . Diabetes mellitus   . Depression   . Asthma   . Alzheimer's disease 08/19/2012  . Type II or unspecified type diabetes mellitus without mention of complication, uncontrolled 08/19/2012  . Hyperlipidemia     Past Surgical History  Procedure Laterality Date  . Knee surgery    . Skin cancer excision    . Back surgery    . Breast surgery  right partial mastectomy  06/2003      Medication List       This list is accurate as of: 11/15/14 11:59 PM.  Always use your most recent med list.               calcium-vitamin D 500-200 MG-UNIT per tablet  Commonly known as:  OSCAL WITH D  Take 1 tablet by mouth 3 (three) times daily.     donepezil 10 MG tablet  Commonly known as:  ARICEPT  Take 10 mg by mouth at bedtime. For dementia     escitalopram 10 MG tablet  Commonly known as:  LEXAPRO  Take 10 mg by mouth daily. For depression     ferrous sulfate 325 (65 FE) MG tablet  Take 325 mg by mouth daily with breakfast.     memantine 10 MG tablet  Commonly known as:  NAMENDA  Take 10 mg by mouth 2 (two) times daily. For dementia     mirtazapine 15 MG tablet  Commonly known as:  REMERON  Take 15 mg by mouth at bedtime.     protein supplement Powd  Take 1 scoop by mouth 2 (two) times daily.     risperiDONE 0.25 MG tablet  Commonly known as:  RISPERDAL  Take 0.25 mg by mouth at bedtime.     triazolam 0.25 MG tablet  Commonly known as:  HALCION  Take 0.25 mg by mouth at bedtime as needed (Prior to dental procedure as directed by the [facility]).        No orders of the defined types were placed in this encounter.    Immunization History  Administered Date(s) Administered  . Influenza Whole 12/27/2012  . Influenza-Unspecified 12/27/2013  . PPD Test 06/05/2009  . Pneumococcal Polysaccharide-23 01/17/2013    Social History  Substance Use Topics  . Smoking status: Former Research scientist (life sciences)  . Smokeless tobacco: Not on file  . Alcohol Use: No    Review of Systems  DATA OBTAINED: from  nurse, GENERAL:  no fevers, fatigue, appetite changes SKIN: No itching, rash HEENT: No complaint RESPIRATORY: No cough, wheezing, SOB CARDIAC: No chest pain, palpitations, lower extremity edema  GI: No abdominal pain, No N/V/D or constipation, No heartburn or reflux  GU: No dysuria, frequency or urgency, or  incontinence  MUSCULOSKELETAL: No unrelieved bone/joint pain NEUROLOGIC: No headache, dizziness  PSYCHIATRIC: No overt anxiety or sadness  Filed Vitals:   11/17/14 1344  BP: 120/65  Pulse: 69  Temp: 97.1 F (36.2 C)  Resp: 18    Physical Exam  GENERAL APPEARANCE: Alert, conversant, No acute distress  SKIN: No diaphoresis rash, or wounds HEENT: Unremarkable RESPIRATORY: Breathing is even, unlabored. Lung sounds are clear   CARDIOVASCULAR: Heart RRR no murmurs, rubs or gallops. No peripheral edema  GASTROINTESTINAL: Abdomen is soft, non-tender, not distended w/ normal bowel sounds.  GENITOURINARY: Bladder non tender, not distended  MUSCULOSKELETAL: No abnormal joints or musculature NEUROLOGIC: Cranial  nerves 2-12 grossly intact. Moves all extremities PSYCHIATRIC: baseline confusion, no behavioral issues  Patient Active Problem List   Diagnosis Date Noted  . Bradycardia 09/18/2014  . Prolonged QT interval 09/18/2014  . Hypocalcemia 05/27/2014  . FTT (failure to thrive) in adult 05/27/2014  . Dysphagia, oral phase 04/26/2014  . CKD (chronic kidney disease) stage 2, GFR 60-89 ml/min 04/26/2014  . Type 2 diabetes mellitus without complication 40/97/3532  . Hyperlipidemia   . Chronic renal disease, stage 2, mildly decreased glomerular filtration rate between 60-89 mL/min/1.73 square meter 10/14/2013  . Cough 06/29/2013  . Hypoglycemia 01/16/2013  . Psychosis 09/20/2012  . Diabetes mellitus type 2, uncontrolled, without complications 99/24/2683  . Dementia with behavioral disturbance 08/19/2012  . Iron deficiency anemia 08/19/2012  . Depression 08/19/2012  . Malignant neoplasm of female breast 06/30/2008  . ADENOCARCINOMA, OVARY 06/29/2008  . Anxiety state 01/16/2007    CBC    Component Value Date/Time   WBC 8.4 09/10/2014 0300   WBC 5.9 04/27/2014   WBC 4.8 10/05/2008 1424   RBC 3.77* 09/10/2014 0300   RBC 3.84 10/05/2008 1424   HGB 10.9* 09/10/2014 0300   HGB 11.5* 10/05/2008 1424   HCT 33.6* 09/10/2014 0300   HCT 34.6* 10/05/2008 1424   PLT 270 09/10/2014 0300   PLT 262 10/05/2008 1424   MCV 89.1 09/10/2014 0300   MCV 90.1 10/05/2008 1424   LYMPHSABS 1.4 09/09/2014 1148   LYMPHSABS 1.4 10/05/2008 1424   MONOABS 0.4 09/09/2014 1148   MONOABS 0.3 10/05/2008 1424   EOSABS 0.1 09/09/2014 1148   EOSABS 0.1 10/05/2008 1424   BASOSABS 0.0 09/09/2014 1148   BASOSABS 0.0 10/05/2008 1424    CMP     Component Value Date/Time   NA 142 09/11/2014 0405   NA 140 04/27/2014   K 4.1 09/11/2014 0405   CL 108 09/11/2014 0405   CO2 31 09/11/2014 0405   GLUCOSE 95 09/11/2014 0405   BUN 12 09/11/2014 0405   BUN 10 04/27/2014   CREATININE 0.84 09/11/2014 0405   CALCIUM 7.3*  09/11/2014 0405   PROT 5.4* 09/10/2014 0300   ALBUMIN 2.3* 09/10/2014 0300   AST 16 09/10/2014 0300   ALT 11* 09/10/2014 0300   ALKPHOS 62 09/10/2014 0300   BILITOT 0.2* 09/10/2014 0300   GFRNONAA >60 09/11/2014 0405   GFRAA >60 09/11/2014 0405    Assessment and Plan  Type 2 diabetes mellitus without complication No further reports of elevated BS since starting 5u with lunch and dinner; no reports of low BS either; Plan - HbA1c, FLP  Chronic renal disease, stage 2, mildly decreased glomerular filtration rate between 60-89 mL/min/1.73 square meter In 08/2014 GFR > 60, BUN 15/Cr 0.84 so stable;Plan - cont monitoring at intervals  Depression Plan- continue lexapro 10 mg;pt has been stable    [place], [place]  D, MD

## 2014-11-17 ENCOUNTER — Encounter: Payer: Self-pay | Admitting: Internal Medicine

## 2014-11-17 NOTE — Assessment & Plan Note (Signed)
Plan- continue lexapro 10 mg;pt has been stable

## 2014-11-17 NOTE — Assessment & Plan Note (Signed)
In 08/2014 GFR > 60, BUN 15/Cr 0.84 so stable;Plan - cont monitoring at intervals

## 2014-11-17 NOTE — Assessment & Plan Note (Signed)
No further reports of elevated BS since starting 5u with lunch and dinner; no reports of low BS either; Plan - HbA1c, FLP

## 2014-11-21 LAB — HEMOGLOBIN A1C: HEMOGLOBIN A1C: 7.6

## 2014-11-21 LAB — CBC AND DIFFERENTIAL
HCT: 38 % (ref 36–46)
Hemoglobin: 12.2 g/dL (ref 12.0–16.0)
PLATELETS: 279 10*3/uL (ref 150–399)
WBC: 6 10^3/mL

## 2014-11-21 LAB — LIPID PANEL
CHOLESTEROL: 145 mg/dL (ref 0–200)
HDL: 31 mg/dL — AB (ref 35–70)
LDL Cholesterol: 97 mg/dL
TRIGLYCERIDES: 84 mg/dL (ref 40–160)

## 2014-11-21 LAB — BASIC METABOLIC PANEL
BUN: 17 mg/dL (ref 4–21)
CREATININE: 0.8 mg/dL (ref 0.5–1.1)
Glucose: 129 mg/dL
POTASSIUM: 3.7 mmol/L (ref 3.4–5.3)
SODIUM: 143 mmol/L (ref 137–147)

## 2014-11-21 LAB — HEPATIC FUNCTION PANEL
ALK PHOS: 57 U/L (ref 25–125)
ALT: 6 U/L — AB (ref 7–35)
AST: 12 U/L — AB (ref 13–35)
BILIRUBIN, TOTAL: 0.2 mg/dL

## 2014-12-15 ENCOUNTER — Encounter: Payer: Self-pay | Admitting: Internal Medicine

## 2014-12-15 ENCOUNTER — Non-Acute Institutional Stay (SKILLED_NURSING_FACILITY): Payer: Medicare HMO | Admitting: Internal Medicine

## 2014-12-15 DIAGNOSIS — F0391 Unspecified dementia with behavioral disturbance: Secondary | ICD-10-CM | POA: Diagnosis not present

## 2014-12-15 DIAGNOSIS — F411 Generalized anxiety disorder: Secondary | ICD-10-CM

## 2014-12-15 DIAGNOSIS — F03918 Unspecified dementia, unspecified severity, with other behavioral disturbance: Secondary | ICD-10-CM

## 2014-12-15 DIAGNOSIS — R1311 Dysphagia, oral phase: Secondary | ICD-10-CM

## 2014-12-15 NOTE — Progress Notes (Signed)
MRN: [id number] Name: Jill Francis  Sex: female Age: 81 y.o. DOB: 08-11-1927  [place] #: [facility] Facility/Room:216 Level Of Care: SNF Provider: Inocencio Homes D Emergency Contacts: Extended Emergency Contact Information Primary Emergency Contact: [name] [place] of [place] Phone: [phone] Work Phone: [phone] Mobile Phone: [phone] Relation: Niece Secondary Emergency Contact: [place]  [place] Phone: [phone] Relation: None  Code Status:   Allergies: Gabapentin; Iodine; and Sulfonamide derivatives  Chief Complaint  Patient presents with  . Medical Management of Chronic Issues    HPI: Patient is 81 y.o. female who is being seen for routine issues dysphagia, dementia and anxiety.  Past Medical History  Diagnosis Date  . Cancer     Skin cancer  . Diabetes mellitus   . Depression   . Asthma   . Alzheimer's disease 08/19/2012  . Type II or unspecified type diabetes mellitus without mention of complication, uncontrolled 08/19/2012  . Hyperlipidemia     Past Surgical History  Procedure Laterality Date  . Knee surgery    . Skin cancer excision    . Back surgery    . Breast surgery  right partial mastectomy  06/2003      Medication List       This list is accurate as of: 12/15/14 10:55 AM.  Always use your most recent med list.               calcium-vitamin D 500-200 MG-UNIT tablet  Commonly known as:  OSCAL WITH D  Take 1 tablet by mouth 3 (three) times daily.     donepezil 10 MG tablet  Commonly known as:  ARICEPT  Take 10 mg by mouth at bedtime. For dementia     escitalopram 10 MG tablet  Commonly known as:  LEXAPRO  Take 10 mg by mouth daily. For depression     ferrous sulfate 325 (65 FE) MG tablet  Take 325 mg by mouth daily with breakfast.     memantine 10 MG tablet  Commonly known as:  NAMENDA  Take 10 mg by mouth 2 (two) times daily. For dementia     mirtazapine 15 MG tablet   Commonly known as:  REMERON  Take 15 mg by mouth at bedtime.     protein supplement Powd  Take 1 scoop by mouth 2 (two) times daily.     triazolam 0.25 MG tablet  Commonly known as:  HALCION  Take 0.25 mg by mouth at bedtime as needed (Prior to dental procedure as directed by the [facility]).        No orders of the defined types were placed in this encounter.    Immunization History  Administered Date(s) Administered  . Influenza Whole 12/27/2012  . Influenza-Unspecified 12/27/2013  . PPD Test 06/05/2009  . Pneumococcal Polysaccharide-23 01/17/2013    Social History  Substance Use Topics  . Smoking status: Former Research scientist (life sciences)  . Smokeless tobacco: Not on file  . Alcohol Use: No    Review of Systems  DATA OBTAINED: from nurse - no concerns GENERAL:  no fevers, fatigue, appetite changes SKIN: No itching, rash HEENT: No complaint RESPIRATORY: No cough, wheezing, SOB CARDIAC: No chest pain, palpitations, lower extremity edema  GI: No abdominal pain, No N/V/D or constipation, No heartburn or reflux  GU: No dysuria, frequency or urgency, or incontinence  MUSCULOSKELETAL: No unrelieved bone/joint pain NEUROLOGIC: No headache, dizziness  PSYCHIATRIC: No overt anxiety or sadness  Filed Vitals:   12/15/14  1047  BP: 115/66  Pulse: 58  Temp: 98 F (36.7 C)  Resp: 18    Physical Exam  GENERAL APPEARANCE: Alert, BF No acute distress  SKIN: No diaphoresis rash HEENT: Unremarkable RESPIRATORY: Breathing is even, unlabored. Lung sounds are clear   CARDIOVASCULAR: Heart RRR no murmurs, rubs or gallops. No peripheral edema  GASTROINTESTINAL: Abdomen is soft, non-tender, not distended w/ normal bowel sounds.  GENITOURINARY: Bladder non tender, not distended  MUSCULOSKELETAL: No abnormal joints or musculature NEUROLOGIC: Cranial nerves 2-12 grossly intact. PSYCHIATRIC: dementia, no behavioral issues  Patient Active Problem List   Diagnosis Date Noted  .  Bradycardia 09/18/2014  . Prolonged QT interval 09/18/2014  . Hypocalcemia 05/27/2014  . FTT (failure to thrive) in adult 05/27/2014  . Dysphagia, oral phase 04/26/2014  . CKD (chronic kidney disease) stage 2, GFR 60-89 ml/min 04/26/2014  . Type 2 diabetes mellitus without complication 88/41/6606  . Hyperlipidemia   . Chronic renal disease, stage 2, mildly decreased glomerular filtration rate between 60-89 mL/min/1.73 square meter 10/14/2013  . Cough 06/29/2013  . Hypoglycemia 01/16/2013  . Psychosis 09/20/2012  . Diabetes mellitus type 2, uncontrolled, without complications 30/16/0109  . Dementia with behavioral disturbance 08/19/2012  . Iron deficiency anemia 08/19/2012  . Depression 08/19/2012  . Malignant neoplasm of female breast 06/30/2008  . ADENOCARCINOMA, OVARY 06/29/2008  . Anxiety state 01/16/2007    CBC    Component Value Date/Time   WBC 8.4 09/10/2014 0300   WBC 5.9 04/27/2014   WBC 4.8 10/05/2008 1424   RBC 3.77* 09/10/2014 0300   RBC 3.84 10/05/2008 1424   HGB 10.9* 09/10/2014 0300   HGB 11.5* 10/05/2008 1424   HCT 33.6* 09/10/2014 0300   HCT 34.6* 10/05/2008 1424   PLT 270 09/10/2014 0300   PLT 262 10/05/2008 1424   MCV 89.1 09/10/2014 0300   MCV 90.1 10/05/2008 1424   LYMPHSABS 1.4 09/09/2014 1148   LYMPHSABS 1.4 10/05/2008 1424   MONOABS 0.4 09/09/2014 1148   MONOABS 0.3 10/05/2008 1424   EOSABS 0.1 09/09/2014 1148   EOSABS 0.1 10/05/2008 1424   BASOSABS 0.0 09/09/2014 1148   BASOSABS 0.0 10/05/2008 1424    CMP     Component Value Date/Time   NA 142 09/11/2014 0405   NA 140 04/27/2014   K 4.1 09/11/2014 0405   CL 108 09/11/2014 0405   CO2 31 09/11/2014 0405   GLUCOSE 95 09/11/2014 0405   BUN 12 09/11/2014 0405   BUN 10 04/27/2014   CREATININE 0.84 09/11/2014 0405   CALCIUM 7.3* 09/11/2014 0405   PROT 5.4* 09/10/2014 0300   ALBUMIN 2.3* 09/10/2014 0300   AST 16 09/10/2014 0300   ALT 11* 09/10/2014 0300   ALKPHOS 62 09/10/2014 0300    BILITOT 0.2* 09/10/2014 0300   GFRNONAA >60 09/11/2014 0405   GFRAA >60 09/11/2014 0405    Assessment and Plan  Dysphagia, oral phase Chronic and stable without signs or sx aspiration ; Plan - cont current diet, mechanical soft  Dementia with behavioral disturbance Chronic and stable; Plan - cont supportive care, cont aricept , namenda  Anxiety state Chronic and stable and controlled on lexapro;plan - conr lexapro    Hennie Duos, MD

## 2014-12-15 NOTE — Assessment & Plan Note (Signed)
Chronic and stable and controlled on lexapro;plan - conr lexapro

## 2014-12-15 NOTE — Assessment & Plan Note (Signed)
Chronic and stable without signs or sx aspiration ; Plan - cont current diet, mechanical soft

## 2014-12-15 NOTE — Assessment & Plan Note (Signed)
Chronic and stable; Plan - cont supportive care, cont aricept , namenda

## 2015-01-31 ENCOUNTER — Encounter: Payer: Self-pay | Admitting: Internal Medicine

## 2015-01-31 ENCOUNTER — Non-Acute Institutional Stay (SKILLED_NURSING_FACILITY): Payer: Medicare HMO | Admitting: Internal Medicine

## 2015-01-31 DIAGNOSIS — F329 Major depressive disorder, single episode, unspecified: Secondary | ICD-10-CM

## 2015-01-31 DIAGNOSIS — D509 Iron deficiency anemia, unspecified: Secondary | ICD-10-CM | POA: Diagnosis not present

## 2015-01-31 DIAGNOSIS — F32A Depression, unspecified: Secondary | ICD-10-CM

## 2015-01-31 NOTE — Assessment & Plan Note (Signed)
Most recent Hb 02/16/36.7 in 11/2014, improved form prio; Plan - cont iron supplement daily and monitor at intervals

## 2015-01-31 NOTE — Progress Notes (Signed)
MRN: [id number] Name: Jill Francis  Sex: female Age: 81 y.o. DOB: May 13, 1927  [place] #: [facility] Facility/Room: Level Of Care: SNF Provider: Inocencio Homes D Emergency Contacts: Extended Emergency Contact Information Primary Emergency Contact: [name] [place] of [place] Phone: [phone] Work Phone: [phone] Mobile Phone: [phone] Relation: Niece Secondary Emergency Contact: [place]  [place] Phone: [phone] Relation: None  Code Status:   Allergies: Gabapentin; Iodine; and Sulfonamide derivatives  Chief Complaint  Patient presents with  . Medical Management of Chronic Issues    HPI: Patient is 81 y.o. female with DM2, depression, asthma, alzheimers and HLD is being seen today for routine issues of anemia, hypocalcemia and depression.  Past Medical History  Diagnosis Date  . Cancer [facility])     Skin cancer  . Diabetes mellitus   . Depression   . Asthma   . Alzheimer's disease 08/19/2012  . Type II or unspecified type diabetes mellitus without mention of complication, uncontrolled 08/19/2012  . Hyperlipidemia     Past Surgical History  Procedure Laterality Date  . Knee surgery    . Skin cancer excision    . Back surgery    . Breast surgery  right partial mastectomy  06/2003      Medication List       This list is accurate as of: 01/31/15 11:59 PM.  Always use your most recent med list.               calcium-vitamin D 500-200 MG-UNIT tablet  Commonly known as:  OSCAL WITH D  Take 1 tablet by mouth 3 (three) times daily.     donepezil 10 MG tablet  Commonly known as:  ARICEPT  Take 10 mg by mouth at bedtime. For dementia     escitalopram 10 MG tablet  Commonly known as:  LEXAPRO  Take 10 mg by mouth daily. For depression     ferrous sulfate 325 (65 FE) MG tablet  Take 325 mg by mouth daily with breakfast.     memantine 10 MG tablet  Commonly known as:  NAMENDA  Take 10 mg by mouth 2 (two)  times daily. For dementia     mirtazapine 15 MG tablet  Commonly known as:  REMERON  Take 15 mg by mouth at bedtime.     protein supplement Powd  Take 1 scoop by mouth 2 (two) times daily.     triazolam 0.25 MG tablet  Commonly known as:  HALCION  Take 0.25 mg by mouth at bedtime as needed (Prior to dental procedure as directed by the [facility]).        No orders of the defined types were placed in this encounter.    Immunization History  Administered Date(s) Administered  . Influenza Whole 12/27/2012  . Influenza-Unspecified 12/27/2013  . PPD Test 06/05/2009  . Pneumococcal Polysaccharide-23 01/17/2013    Social History  Substance Use Topics  . Smoking status: Former Research scientist (life sciences)  . Smokeless tobacco: Not on file  . Alcohol Use: No    Review of Systems  DATA OBTAINED: from nurse - no concerns GENERAL:  no fevers, fatigue, appetite changes SKIN: No itching, rash HEENT: No complaint RESPIRATORY: No cough, wheezing, SOB CARDIAC: No chest pain, palpitations, lower extremity edema  GI: No abdominal pain, No N/V/D or constipation, No heartburn or reflux  GU: No dysuria, frequency or urgency, or incontinence  MUSCULOSKELETAL: No unrelieved bone/joint pain NEUROLOGIC: No headache, dizziness  PSYCHIATRIC: No overt  anxiety or sadness  Filed Vitals:   01/31/15 1402  BP: 114/65  Pulse: 64  Temp: 97 F (36.1 C)  Resp: 20    Physical Exam  GENERAL APPEARANCE: Alert, conversant, No acute distress  SKIN: No diaphoresis rash HEENT: Unremarkable RESPIRATORY: Breathing is even, unlabored. Lung sounds are clear   CARDIOVASCULAR: Heart RRR no murmurs, rubs or gallops. No peripheral edema  GASTROINTESTINAL: Abdomen is soft, non-tender, not distended w/ normal bowel sounds.  GENITOURINARY: Bladder non tender, not distended  MUSCULOSKELETAL: No abnormal joints or musculature NEUROLOGIC: Cranial nerves 2-12 grossly intact. Moves all extremities PSYCHIATRIC:  dementia, no behavioral issues  Patient Active Problem List   Diagnosis Date Noted  . Bradycardia 09/18/2014  . Prolonged QT interval 09/18/2014  . Hypocalcemia 05/27/2014  . FTT (failure to thrive) in adult 05/27/2014  . Dysphagia, oral phase 04/26/2014  . CKD (chronic kidney disease) stage 2, GFR 60-89 ml/min 04/26/2014  . Type 2 diabetes mellitus without complication ([place]) Q000111Q  . Hyperlipidemia   . Chronic renal disease, stage 2, mildly decreased glomerular filtration rate between 60-89 mL/min/1.73 square meter 10/14/2013  . Cough 06/29/2013  . Hypoglycemia 01/16/2013  . Psychosis 09/20/2012  . Diabetes mellitus type 2, uncontrolled, without complications ([place]) Q000111Q  . Dementia with behavioral disturbance 08/19/2012  . Iron deficiency anemia 08/19/2012  . Depression 08/19/2012  . Malignant neoplasm of female breast ([place]) 06/30/2008  . ADENOCARCINOMA, OVARY 06/29/2008  . Anxiety state 01/16/2007    CBC    Component Value Date/Time   WBC 8.4 09/10/2014 0300   WBC 5.9 04/27/2014   WBC 4.8 10/05/2008 1424   RBC 3.77* 09/10/2014 0300   RBC 3.84 10/05/2008 1424   HGB 10.9* 09/10/2014 0300   HGB 11.5* 10/05/2008 1424   HCT 33.6* 09/10/2014 0300   HCT 34.6* 10/05/2008 1424   PLT 270 09/10/2014 0300   PLT 262 10/05/2008 1424   MCV 89.1 09/10/2014 0300   MCV 90.1 10/05/2008 1424   LYMPHSABS 1.4 09/09/2014 1148   LYMPHSABS 1.4 10/05/2008 1424   MONOABS 0.4 09/09/2014 1148   MONOABS 0.3 10/05/2008 1424   EOSABS 0.1 09/09/2014 1148   EOSABS 0.1 10/05/2008 1424   BASOSABS 0.0 09/09/2014 1148   BASOSABS 0.0 10/05/2008 1424    CMP     Component Value Date/Time   NA 142 09/11/2014 0405   NA 140 04/27/2014   K 4.1 09/11/2014 0405   CL 108 09/11/2014 0405   CO2 31 09/11/2014 0405   GLUCOSE 95 09/11/2014 0405   BUN 12 09/11/2014 0405   BUN 10 04/27/2014   CREATININE 0.84 09/11/2014 0405   CALCIUM 7.3* 09/11/2014 0405   PROT 5.4* 09/10/2014 0300   ALBUMIN  2.3* 09/10/2014 0300   AST 16 09/10/2014 0300   ALT 11* 09/10/2014 0300   ALKPHOS 62 09/10/2014 0300   BILITOT 0.2* 09/10/2014 0300   GFRNONAA >60 09/11/2014 0405   GFRAA >60 09/11/2014 0405    Assessment and Plan  Iron deficiency anemia Most recent Hb 02/16/36.7 in 11/2014, improved form prio; Plan - cont iron supplement daily and monitor at intervals  Hypocalcemia In 11/2014 Ca+ 8.2, improved; Plan cont calcium, 500 mg TID; will order Mg+  Depression Lexapro has ben decreased to 5 mg daily for pharmacy GDR; will monitor effect    Hennie Duos, MD

## 2015-01-31 NOTE — Assessment & Plan Note (Signed)
Lexapro has ben decreased to 5 mg daily for pharmacy GDR; will monitor effect

## 2015-01-31 NOTE — Assessment & Plan Note (Signed)
In 11/2014 Ca+ 8.2, improved; Plan cont calcium, 500 mg TID; will order Mg+

## 2015-02-27 ENCOUNTER — Encounter: Payer: Self-pay | Admitting: Internal Medicine

## 2015-02-27 ENCOUNTER — Non-Acute Institutional Stay (SKILLED_NURSING_FACILITY): Payer: Medicare HMO | Admitting: Internal Medicine

## 2015-02-27 DIAGNOSIS — R1311 Dysphagia, oral phase: Secondary | ICD-10-CM

## 2015-02-27 DIAGNOSIS — E785 Hyperlipidemia, unspecified: Secondary | ICD-10-CM | POA: Diagnosis not present

## 2015-02-27 DIAGNOSIS — Z794 Long term (current) use of insulin: Secondary | ICD-10-CM

## 2015-02-27 DIAGNOSIS — E119 Type 2 diabetes mellitus without complications: Secondary | ICD-10-CM

## 2015-02-27 NOTE — Assessment & Plan Note (Signed)
11/2014 A1c - 7.6, good control for this age; plan - cont curent regimen

## 2015-02-27 NOTE — Assessment & Plan Note (Addendum)
11/2014 - LDL -97, HDL-31, TG - 84; would not put on meds; will monitor at intervals

## 2015-02-27 NOTE — Assessment & Plan Note (Signed)
ST is treating for 6 weeks for oropharyngeal and diet has ben changed to pureed meats, thin liquids and mechanical soft solids

## 2015-02-27 NOTE — Progress Notes (Signed)
MRN: [id number] Name: Jill Francis  Sex: female Age: 81 y.o. DOB: 1928-01-05  [place] #:  Facility/Room: Level Of Care: SNF Provider: Inocencio Homes D Emergency Contacts: Extended Emergency Contact Information Primary Emergency Contact: [name] States of [place] Phone: [phone] Work Phone: [phone] Mobile Phone: [phone] Relation: Niece Secondary Emergency Contact: [place]  [place] Phone: [phone] Relation: None  Code Status:   Allergies: Gabapentin; Iodine; and Sulfonamide derivatives  Chief Complaint  Patient presents with  . Medical Management of Chronic Issues   HPI: Patient is 81 y.o. female with DM2, depression, asthma, alzheimers and HLD is being seen today for routine issues of dysphagia, DM2 and HLD.  Past Medical History  Diagnosis Date  . Cancer [facility])     Skin cancer  . Diabetes mellitus   . Depression   . Asthma   . Alzheimer's disease 08/19/2012  . Type II or unspecified type diabetes mellitus without mention of complication, uncontrolled 08/19/2012  . Hyperlipidemia     Past Surgical History  Procedure Laterality Date  . Knee surgery    . Skin cancer excision    . Back surgery    . Breast surgery  right partial mastectomy  06/2003      Medication List       This list is accurate as of: 02/27/15  4:57 PM.  Always use your most recent med list.               calcium-vitamin D 500-200 MG-UNIT tablet  Commonly known as:  OSCAL WITH D  Take 1 tablet by mouth 3 (three) times daily.     donepezil 10 MG tablet  Commonly known as:  ARICEPT  Take 10 mg by mouth at bedtime. For dementia     escitalopram 10 MG tablet  Commonly known as:  LEXAPRO  Take 10 mg by mouth daily. For depression     ferrous sulfate 325 (65 FE) MG tablet  Take 325 mg by mouth daily with breakfast.     memantine 10 MG tablet  Commonly known as:  NAMENDA  Take 10 mg by mouth 2 (two) times daily. For dementia      mirtazapine 15 MG tablet  Commonly known as:  REMERON  Take 15 mg by mouth at bedtime.     protein supplement Powd  Take 1 scoop by mouth 2 (two) times daily.     triazolam 0.25 MG tablet  Commonly known as:  HALCION  Take 0.25 mg by mouth at bedtime as needed (Prior to dental procedure as directed by the [facility]).        No orders of the defined types were placed in this encounter.    Immunization History  Administered Date(s) Administered  . Influenza Whole 12/27/2012  . Influenza-Unspecified 12/27/2013  . PPD Test 06/05/2009  . Pneumococcal Polysaccharide-23 01/17/2013    Social History  Substance Use Topics  . Smoking status: Former Research scientist (life sciences)  . Smokeless tobacco: Not on file  . Alcohol Use: No    Review of Systems  DATA OBTAINED: from patient, nurse- no conderns GENERAL:  no fevers, fatigue, appetite changes SKIN: No itching, rash HEENT: No complaint RESPIRATORY: No cough, wheezing, SOB CARDIAC: No chest pain, palpitations, lower extremity edema  GI: No abdominal pain, No N/V/D or constipation, No heartburn or reflux  GU: No dysuria, frequency or urgency, or incontinence  MUSCULOSKELETAL: No unrelieved bone/joint pain NEUROLOGIC: No headache, dizziness  PSYCHIATRIC: No overt anxiety  or sadness  Filed Vitals:   02/27/15 1643  BP: 114/73  Pulse: 84  Temp: 98.1 F (36.7 C)  Resp: 18    Physical Exam  GENERAL APPEARANCE: Alert, conversant, BF, No acute distress  SKIN: No diaphoresis rash HEENT: Unremarkable RESPIRATORY: Breathing is even, unlabored. Lung sounds are clear   CARDIOVASCULAR: Heart RRR no murmurs, rubs or gallops. No peripheral edema  GASTROINTESTINAL: Abdomen is soft, non-tender, not distended w/ normal bowel sounds.  GENITOURINARY: Bladder non tender, not distended  MUSCULOSKELETAL: No abnormal joints or musculature NEUROLOGIC: Cranial nerves 2-12 grossly intact. Moves all extremities PSYCHIATRIC:  dementia, no  behavioral issues  Patient Active Problem List   Diagnosis Date Noted  . Bradycardia 09/18/2014  . Prolonged QT interval 09/18/2014  . Hypocalcemia 05/27/2014  . FTT (failure to thrive) in adult 05/27/2014  . Dysphagia, oral phase 04/26/2014  . CKD (chronic kidney disease) stage 2, GFR 60-89 ml/min 04/26/2014  . Type 2 diabetes mellitus without complication ([place]) Q000111Q  . Hyperlipidemia   . Chronic renal disease, stage 2, mildly decreased glomerular filtration rate between 60-89 mL/min/1.73 square meter 10/14/2013  . Cough 06/29/2013  . Hypoglycemia 01/16/2013  . Psychosis 09/20/2012  . Diabetes mellitus type 2, uncontrolled, without complications ([place]) Q000111Q  . Dementia with behavioral disturbance 08/19/2012  . Iron deficiency anemia 08/19/2012  . Depression 08/19/2012  . Malignant neoplasm of female breast ([place]) 06/30/2008  . ADENOCARCINOMA, OVARY 06/29/2008  . Anxiety state 01/16/2007    CBC    Component Value Date/Time   WBC 8.4 09/10/2014 0300   WBC 5.9 04/27/2014   WBC 4.8 10/05/2008 1424   RBC 3.77* 09/10/2014 0300   RBC 3.84 10/05/2008 1424   HGB 10.9* 09/10/2014 0300   HGB 11.5* 10/05/2008 1424   HCT 33.6* 09/10/2014 0300   HCT 34.6* 10/05/2008 1424   PLT 270 09/10/2014 0300   PLT 262 10/05/2008 1424   MCV 89.1 09/10/2014 0300   MCV 90.1 10/05/2008 1424   LYMPHSABS 1.4 09/09/2014 1148   LYMPHSABS 1.4 10/05/2008 1424   MONOABS 0.4 09/09/2014 1148   MONOABS 0.3 10/05/2008 1424   EOSABS 0.1 09/09/2014 1148   EOSABS 0.1 10/05/2008 1424   BASOSABS 0.0 09/09/2014 1148   BASOSABS 0.0 10/05/2008 1424    CMP     Component Value Date/Time   NA 142 09/11/2014 0405   NA 140 04/27/2014   K 4.1 09/11/2014 0405   CL 108 09/11/2014 0405   CO2 31 09/11/2014 0405   GLUCOSE 95 09/11/2014 0405   BUN 12 09/11/2014 0405   BUN 10 04/27/2014   CREATININE 0.84 09/11/2014 0405   CALCIUM 7.3* 09/11/2014 0405   PROT 5.4* 09/10/2014 0300   ALBUMIN 2.3*  09/10/2014 0300   AST 16 09/10/2014 0300   ALT 11* 09/10/2014 0300   ALKPHOS 62 09/10/2014 0300   BILITOT 0.2* 09/10/2014 0300   GFRNONAA >60 09/11/2014 0405   GFRAA >60 09/11/2014 0405    Assessment and Plan  Type 2 diabetes mellitus without complication [id number] - 7.6, good control for this age; plan - cont curent regimen  Hyperlipidemia 11/2014 - LDL -97, HDL-31, TG - 84; would not put on meds; will monitor at intervals  Dysphagia, oral phase ST is treating for 6 weeks for oropharyngeal and diet has ben changed to pureed meats, thin liquids and mechanical soft solids    Hennie Duos, MD

## 2015-03-04 ENCOUNTER — Encounter: Payer: Self-pay | Admitting: Internal Medicine

## 2015-03-13 LAB — HM DIABETES FOOT EXAM

## 2015-03-27 ENCOUNTER — Encounter: Payer: Self-pay | Admitting: Internal Medicine

## 2015-03-27 ENCOUNTER — Non-Acute Institutional Stay (SKILLED_NURSING_FACILITY): Payer: Medicare Other | Admitting: Internal Medicine

## 2015-03-27 DIAGNOSIS — N182 Chronic kidney disease, stage 2 (mild): Secondary | ICD-10-CM

## 2015-03-27 DIAGNOSIS — F329 Major depressive disorder, single episode, unspecified: Secondary | ICD-10-CM | POA: Diagnosis not present

## 2015-03-27 DIAGNOSIS — F03918 Unspecified dementia, unspecified severity, with other behavioral disturbance: Secondary | ICD-10-CM

## 2015-03-27 DIAGNOSIS — F32A Depression, unspecified: Secondary | ICD-10-CM

## 2015-03-27 DIAGNOSIS — F0391 Unspecified dementia with behavioral disturbance: Secondary | ICD-10-CM | POA: Diagnosis not present

## 2015-03-27 NOTE — Assessment & Plan Note (Signed)
lexapro was d/c completely in mid December; no visible efffects;will cont to monitor

## 2015-03-27 NOTE — Progress Notes (Signed)
MRN: [id number] Name: Jill Francis  Sex: female Age: 82 y.o. DOB: [birth year]  [place] #: [facility] Facility/Room: Level Of Care: SNF Provider: Inocencio Homes D Emergency Contacts: Extended Emergency Contact Information Primary Emergency Contact: [name] States of [place] Phone: [phone] Work Phone: [phone] Mobile Phone: [phone] Relation: Niece Secondary Emergency Contact: [place]  [place] Phone: [phone] Relation: None  Code Status:   Allergies: Gabapentin; Iodine; and Sulfonamide derivatives  Chief Complaint  Patient presents with  . Medical Management of Chronic Issues    HPI: Patient is 82 y.o. female with DM2, depression, asthma, alzheimers and HLD is being seen today for routine issues of dementia, CKD2, and depression.   Past Medical History  Diagnosis Date  . Cancer [facility])     Skin cancer  . Diabetes mellitus   . Depression   . Asthma   . Alzheimer's disease 08/19/2012  . Type II or unspecified type diabetes mellitus without mention of complication, uncontrolled 08/19/2012  . Hyperlipidemia     Past Surgical History  Procedure Laterality Date  . Knee surgery    . Skin cancer excision    . Back surgery    . Breast surgery  right partial mastectomy  06/2003      Medication List       This list is accurate as of: 03/27/15  7:28 PM.  Always use your most recent med list.               calcium-vitamin D 500-200 MG-UNIT tablet  Commonly known as:  OSCAL WITH D  Take 1 tablet by mouth 3 (three) times daily.     donepezil 10 MG tablet  Commonly known as:  ARICEPT  Take 10 mg by mouth at bedtime. For dementia     escitalopram 10 MG tablet  Commonly known as:  LEXAPRO  Take 10 mg by mouth daily. For depression     ferrous sulfate 325 (65 FE) MG tablet  Take 325 mg by mouth daily with breakfast.     memantine 10 MG tablet  Commonly known as:  NAMENDA  Take 10 mg by mouth 2 (two) times  daily. For dementia     mirtazapine 15 MG tablet  Commonly known as:  REMERON  Take 15 mg by mouth at bedtime.     protein supplement Powd  Take 1 scoop by mouth 2 (two) times daily.     triazolam 0.25 MG tablet  Commonly known as:  HALCION  Take 0.25 mg by mouth at bedtime as needed (Prior to dental procedure as directed by the [facility]).        No orders of the defined types were placed in this encounter.    Immunization History  Administered Date(s) Administered  . Influenza Whole 12/27/2012  . Influenza-Unspecified 12/27/2013  . PPD Test 06/05/2009  . Pneumococcal Polysaccharide-23 01/17/2013    Social History  Substance Use Topics  . Smoking status: Former Research scientist (life sciences)  . Smokeless tobacco: Not on file  . Alcohol Use: No    Review of Systems  DATA OBTAINED: from patient, nurse, medical record, family member GENERAL:  no fevers, fatigue, appetite changes SKIN: No itching, rash HEENT: No complaint RESPIRATORY: No cough, wheezing, SOB CARDIAC: No chest pain, palpitations, lower extremity edema  GI: No abdominal pain, No N/V/D or constipation, No heartburn or reflux  GU: No dysuria, frequency or urgency, or incontinence  MUSCULOSKELETAL: No unrelieved bone/joint pain NEUROLOGIC: No headache, dizziness  PSYCHIATRIC: No overt anxiety or sadness  There were no vitals filed for this visit.  Physical Exam  GENERAL APPEARANCE: Alert, conversant, No acute distress  SKIN: No diaphoresis rash HEENT: Unremarkable RESPIRATORY: Breathing is even, unlabored. Lung sounds are clear   CARDIOVASCULAR: Heart RRR no murmurs, rubs or gallops. No peripheral edema  GASTROINTESTINAL: Abdomen is soft, non-tender, not distended w/ normal bowel sounds.  GENITOURINARY: Bladder non tender, not distended  MUSCULOSKELETAL: No abnormal joints or musculature NEUROLOGIC: Cranial nerves 2-12 grossly intact. Moves all extremities PSYCHIATRIC: dementia, no behavioral  issues  Patient Active Problem List   Diagnosis Date Noted  . Bradycardia 09/18/2014  . Prolonged QT interval 09/18/2014  . Hypocalcemia 05/27/2014  . FTT (failure to thrive) in adult 05/27/2014  . Dysphagia, oral phase 04/26/2014  . CKD (chronic kidney disease) stage 2, GFR 60-89 ml/min 04/26/2014  . Type 2 diabetes mellitus without complication ([place]) Q000111Q  . Hyperlipidemia   . Chronic renal disease, stage 2, mildly decreased glomerular filtration rate between 60-89 mL/min/1.73 square meter 10/14/2013  . Cough 06/29/2013  . Hypoglycemia 01/16/2013  . Psychosis 09/20/2012  . Diabetes mellitus type 2, uncontrolled, without complications ([place]) Q000111Q  . Dementia with behavioral disturbance 08/19/2012  . Iron deficiency anemia 08/19/2012  . Depression 08/19/2012  . Malignant neoplasm of female breast ([place]) 06/30/2008  . ADENOCARCINOMA, OVARY 06/29/2008  . Anxiety state 01/16/2007    CBC    Component Value Date/Time   WBC 8.4 09/10/2014 0300   WBC 5.9 04/27/2014   WBC 4.8 10/05/2008 1424   RBC 3.77* 09/10/2014 0300   RBC 3.84 10/05/2008 1424   HGB 10.9* 09/10/2014 0300   HGB 11.5* 10/05/2008 1424   HCT 33.6* 09/10/2014 0300   HCT 34.6* 10/05/2008 1424   PLT 270 09/10/2014 0300   PLT 262 10/05/2008 1424   MCV 89.1 09/10/2014 0300   MCV 90.1 10/05/2008 1424   LYMPHSABS 1.4 09/09/2014 1148   LYMPHSABS 1.4 10/05/2008 1424   MONOABS 0.4 09/09/2014 1148   MONOABS 0.3 10/05/2008 1424   EOSABS 0.1 09/09/2014 1148   EOSABS 0.1 10/05/2008 1424   BASOSABS 0.0 09/09/2014 1148   BASOSABS 0.0 10/05/2008 1424    CMP     Component Value Date/Time   NA 142 09/11/2014 0405   NA 140 04/27/2014   K 4.1 09/11/2014 0405   CL 108 09/11/2014 0405   CO2 31 09/11/2014 0405   GLUCOSE 95 09/11/2014 0405   BUN 12 09/11/2014 0405   BUN 10 04/27/2014   CREATININE 0.84 09/11/2014 0405   CALCIUM 7.3* 09/11/2014 0405   PROT 5.4* 09/10/2014 0300   ALBUMIN 2.3* 09/10/2014 0300    AST 16 09/10/2014 0300   ALT 11* 09/10/2014 0300   ALKPHOS 62 09/10/2014 0300   BILITOT 0.2* 09/10/2014 0300   GFRNONAA >60 09/11/2014 0405   GFRAA >60 09/11/2014 0405    Assessment and Plan  Dementia with behavioral disturbance Chronic and stable;plan - cont aricept 10 mg and namenda 10 mg BID  Chronic renal disease, stage 2, mildly decreased glomerular filtration rate between 60-89 mL/min/1.73 square meter Most recent GFR 68, BUN 17. Cr 0.8; stable, will monitor at intervals  Depression lexapro was d/c completely in mid December; no visible efffects;will cont to monitor    Hennie Duos, MD

## 2015-03-27 NOTE — Assessment & Plan Note (Signed)
Chronic and stable;plan - cont aricept 10 mg and namenda 10 mg BID

## 2015-03-27 NOTE — Assessment & Plan Note (Signed)
Most recent GFR 68, BUN 17. Cr 0.8; stable, will monitor at intervals

## 2015-04-10 ENCOUNTER — Encounter: Payer: Self-pay | Admitting: Gastroenterology

## 2015-05-09 ENCOUNTER — Encounter: Payer: Self-pay | Admitting: Internal Medicine

## 2015-05-09 ENCOUNTER — Non-Acute Institutional Stay: Payer: Medicare Other | Admitting: Internal Medicine

## 2015-05-09 DIAGNOSIS — E785 Hyperlipidemia, unspecified: Secondary | ICD-10-CM

## 2015-05-09 DIAGNOSIS — F0391 Unspecified dementia with behavioral disturbance: Secondary | ICD-10-CM | POA: Diagnosis not present

## 2015-05-09 DIAGNOSIS — F03918 Unspecified dementia, unspecified severity, with other behavioral disturbance: Secondary | ICD-10-CM

## 2015-05-09 DIAGNOSIS — F29 Unspecified psychosis not due to a substance or known physiological condition: Secondary | ICD-10-CM | POA: Diagnosis not present

## 2015-05-09 DIAGNOSIS — R1311 Dysphagia, oral phase: Secondary | ICD-10-CM | POA: Diagnosis not present

## 2015-05-09 DIAGNOSIS — F329 Major depressive disorder, single episode, unspecified: Secondary | ICD-10-CM | POA: Diagnosis not present

## 2015-05-09 DIAGNOSIS — F32A Depression, unspecified: Secondary | ICD-10-CM

## 2015-05-09 LAB — HM DIABETES FOOT EXAM

## 2015-05-09 NOTE — Progress Notes (Signed)
MRN: [id number] Name: Jill Francis  Sex: female Age: 82 y.o. DOB: December 09, 1927  [place] #: [facility] Facility/Room: Level Of Care: SNF Provider: Inocencio Homes D Emergency Contacts: Extended Emergency Contact Information Primary Emergency Contact: [name] [place] of [place] Phone: [phone] Work Phone: [phone] Mobile Phone: [phone] Relation: Niece Secondary Emergency Contact: [place]  [place] Phone: [phone] Relation: None  Code Status:   Allergies: Gabapentin; Iodine; and Sulfonamide derivatives  Chief Complaint  Patient presents with  . Medical Management of Chronic Issues    HPI: Patient is 82 y.o. female who is being seen for routine issues of depression, psychosis and dysphagia.  Past Medical History  Diagnosis Date  . Cancer [facility])     Skin cancer  . Diabetes mellitus   . Depression   . Asthma   . Alzheimer's disease 08/19/2012  . Type II or unspecified type diabetes mellitus without mention of complication, uncontrolled 08/19/2012  . Hyperlipidemia     Past Surgical History  Procedure Laterality Date  . Knee surgery    . Skin cancer excision    . Back surgery    . Breast surgery  right partial mastectomy  06/2003      Medication List       This list is accurate as of: 05/09/15 11:59 PM.  Always use your most recent med list.               calcium-vitamin D 500-200 MG-UNIT tablet  Commonly known as:  OSCAL WITH D  Take 1 tablet by mouth 3 (three) times daily.     donepezil 10 MG tablet  Commonly known as:  ARICEPT  Take 10 mg by mouth at bedtime. For dementia     escitalopram 10 MG tablet  Commonly known as:  LEXAPRO  Take 10 mg by mouth daily. For depression     ferrous sulfate 325 (65 FE) MG tablet  Take 325 mg by mouth daily with breakfast.     memantine 10 MG tablet  Commonly known as:  NAMENDA  Take 10 mg by mouth 2 (two) times daily. For dementia     mirtazapine 15 MG  tablet  Commonly known as:  REMERON  Take 15 mg by mouth at bedtime.     protein supplement Powd  Take 1 scoop by mouth 2 (two) times daily.     triazolam 0.25 MG tablet  Commonly known as:  HALCION  Take 0.25 mg by mouth at bedtime as needed (Prior to dental procedure as directed by the [facility]).        No orders of the defined types were placed in this encounter.    Immunization History  Administered Date(s) Administered  . Influenza Whole 12/27/2012  . Influenza-Unspecified 12/27/2013  . PPD Test 06/05/2009  . Pneumococcal Polysaccharide-23 01/17/2013    Social History  Substance Use Topics  . Smoking status: Former Research scientist (life sciences)  . Smokeless tobacco: Not on file  . Alcohol Use: No    Review of Systems UTO reliable 2/2 pt dementia; nursing without concerns    Filed Vitals:   05/09/15 1410  BP: 110/66  Pulse: 80  Temp: 98.3 F (36.8 C)  Resp: 18    Physical Exam  GENERAL APPEARANCE: Alert, mod conversant, No acute distress  SKIN: No diaphoresis rash, or wounds HEENT: Unremarkable RESPIRATORY: Breathing is even, unlabored. Lung sounds are clear   CARDIOVASCULAR: Heart RRR no murmurs, rubs or gallops. No peripheral edema  GASTROINTESTINAL: Abdomen is soft, non-tender, not distended w/ normal bowel sounds.  GENITOURINARY: Bladder non tender, not distended  MUSCULOSKELETAL: No abnormal joints or musculature NEUROLOGIC: Cranial nerves 2-12 grossly intact. Moves all extremities PSYCHIATRIC: confused at baselone, no behavioral issues  Patient Active Problem List   Diagnosis Date Noted  . Bradycardia 09/18/2014  . Prolonged QT interval 09/18/2014  . Hypocalcemia 05/27/2014  . FTT (failure to thrive) in adult 05/27/2014  . Dysphagia, oral phase 04/26/2014  . CKD (chronic kidney disease) stage 2, GFR 60-89 ml/min 04/26/2014  . Type 2 diabetes mellitus without complication ([place]) Q000111Q  . Hyperlipidemia   . Chronic renal disease, stage 2,  mildly decreased glomerular filtration rate between 60-89 mL/min/1.73 square meter 10/14/2013  . Cough 06/29/2013  . Hypoglycemia 01/16/2013  . Psychosis 09/20/2012  . Diabetes mellitus type 2, uncontrolled, without complications ([place]) Q000111Q  . Dementia with behavioral disturbance 08/19/2012  . Iron deficiency anemia 08/19/2012  . Depression 08/19/2012  . Malignant neoplasm of female breast ([place]) 06/30/2008  . ADENOCARCINOMA, OVARY 06/29/2008  . Anxiety state 01/16/2007    CBC    Component Value Date/Time   WBC 8.4 09/10/2014 0300   WBC 5.9 04/27/2014   WBC 4.8 10/05/2008 1424   RBC 3.77* 09/10/2014 0300   RBC 3.84 10/05/2008 1424   HGB 10.9* 09/10/2014 0300   HGB 11.5* 10/05/2008 1424   HCT 33.6* 09/10/2014 0300   HCT 34.6* 10/05/2008 1424   PLT 270 09/10/2014 0300   PLT 262 10/05/2008 1424   MCV 89.1 09/10/2014 0300   MCV 90.1 10/05/2008 1424   LYMPHSABS 1.4 09/09/2014 1148   LYMPHSABS 1.4 10/05/2008 1424   MONOABS 0.4 09/09/2014 1148   MONOABS 0.3 10/05/2008 1424   EOSABS 0.1 09/09/2014 1148   EOSABS 0.1 10/05/2008 1424   BASOSABS 0.0 09/09/2014 1148   BASOSABS 0.0 10/05/2008 1424    CMP     Component Value Date/Time   NA 142 09/11/2014 0405   NA 140 04/27/2014   K 4.1 09/11/2014 0405   CL 108 09/11/2014 0405   CO2 31 09/11/2014 0405   GLUCOSE 95 09/11/2014 0405   BUN 12 09/11/2014 0405   BUN 10 04/27/2014   CREATININE 0.84 09/11/2014 0405   CALCIUM 7.3* 09/11/2014 0405   PROT 5.4* 09/10/2014 0300   ALBUMIN 2.3* 09/10/2014 0300   AST 16 09/10/2014 0300   ALT 11* 09/10/2014 0300   ALKPHOS 62 09/10/2014 0300   BILITOT 0.2* 09/10/2014 0300   GFRNONAA >60 09/11/2014 0405   GFRAA >60 09/11/2014 0405    Assessment and Plan  Depression @ months pout after lexapro d/c and pt stable;cont monitor  Psychosis Still stable after risperdol d/c 2/2 QT prolongation ;cont without antipsychotics  Dysphagia, oral phase Chronic and stale on soft mechanical,  pureed meat;plan -cont current diet    Hennie Duos, MD

## 2015-05-12 ENCOUNTER — Encounter: Payer: Self-pay | Admitting: Internal Medicine

## 2015-05-12 NOTE — Assessment & Plan Note (Signed)
@   months pout after lexapro d/c and pt stable;cont monitor

## 2015-05-12 NOTE — Assessment & Plan Note (Signed)
Still stable after risperdol d/c 2/2 QT prolongation ;cont without antipsychotics

## 2015-05-12 NOTE — Assessment & Plan Note (Signed)
Chronic and stale on soft mechanical, pureed meat;plan -cont current diet

## 2015-05-15 LAB — HM DIABETES EYE EXAM

## 2015-06-22 ENCOUNTER — Non-Acute Institutional Stay: Payer: Medicare Other | Admitting: Internal Medicine

## 2015-06-22 DIAGNOSIS — F0391 Unspecified dementia with behavioral disturbance: Secondary | ICD-10-CM

## 2015-06-22 DIAGNOSIS — F32A Depression, unspecified: Secondary | ICD-10-CM

## 2015-06-22 DIAGNOSIS — F03918 Unspecified dementia, unspecified severity, with other behavioral disturbance: Secondary | ICD-10-CM

## 2015-06-22 DIAGNOSIS — F329 Major depressive disorder, single episode, unspecified: Secondary | ICD-10-CM | POA: Diagnosis not present

## 2015-06-22 DIAGNOSIS — M810 Age-related osteoporosis without current pathological fracture: Secondary | ICD-10-CM

## 2015-06-23 ENCOUNTER — Encounter: Payer: Self-pay | Admitting: Internal Medicine

## 2015-06-23 DIAGNOSIS — M810 Age-related osteoporosis without current pathological fracture: Secondary | ICD-10-CM | POA: Insufficient documentation

## 2015-06-23 NOTE — Progress Notes (Signed)
MRN: [id number] Name: Jill Francis  Sex: female Age: 82 y.o. DOB: 1927/05/21  [place] [facility]: [facility] Facility/Room: Level Of Care: SNF Provider: Inocencio Homes D Emergency Contacts: Extended Emergency Contact Information Primary Emergency Contact: [name] [place] of [place] Phone: [phone] Work Phone: [phone] Mobile Phone: [phone] Relation: Niece Secondary Emergency Contact: [place]  [place] Phone: [phone] Relation: None  Code Status:   Allergies: Gabapentin; Iodine; and Sulfonamide derivatives  Chief Complaint  Patient presents with  . Medical Management of Chronic Issues    HPI: Patient is 82 y.o. female who is being seen for routine issues of dementia , osteoporosis and depression.  Past Medical History  Diagnosis Date  . Cancer [facility])     Skin cancer  . Diabetes mellitus   . Depression   . Asthma   . Alzheimer's disease 08/19/2012  . Type II or unspecified type diabetes mellitus without mention of complication, uncontrolled 08/19/2012  . Hyperlipidemia     Past Surgical History  Procedure Laterality Date  . Knee surgery    . Skin cancer excision    . Back surgery    . Breast surgery  right partial mastectomy  06/2003      Medication List       This list is accurate as of: 06/22/15 11:59 PM.  Always use your most recent med list.               calcium-vitamin D 500-200 MG-UNIT tablet  Commonly known as:  OSCAL WITH D  Take 1 tablet by mouth 3 (three) times daily.     donepezil 10 MG tablet  Commonly known as:  ARICEPT  Take 10 mg by mouth at bedtime. For dementia     ferrous sulfate 325 (65 FE) MG tablet  Take 325 mg by mouth daily with breakfast.     memantine 10 MG tablet  Commonly known as:  NAMENDA  Take 10 mg by mouth 2 (two) times daily. For dementia     mirtazapine 15 MG tablet  Commonly known as:  REMERON  Take 15 mg by mouth at bedtime.     NOVOLOG 100 UNIT/ML injection   Generic drug:  insulin aspart  Inject 5 units SQ with lunch and dinner.     sitaGLIPtin 100 MG tablet  Commonly known as:  JANUVIA  Take 100 mg by mouth daily.     TOUJEO SOLOSTAR 300 UNIT/ML Sopn  Generic drug:  Insulin Glargine  Inject 10 Units into the skin. At bedtime.     triazolam 0.25 MG tablet  Commonly known as:  HALCION  Take 0.25 mg by mouth at bedtime as needed (Prior to dental procedure as directed by the [facility]).        No orders of the defined types were placed in this encounter.    Immunization History  Administered Date(s) Administered  . Influenza Whole 12/27/2012  . Influenza-Unspecified 12/27/2013  . PPD Test 06/05/2009  . Pneumococcal Polysaccharide-23 01/17/2013    Social History  Substance Use Topics  . Smoking status: Former Research scientist (life sciences)  . Smokeless tobacco: Not on file  . Alcohol Use: No    Review of Systems  UTO 2/2 dementia- nursing without concerns    Filed Vitals:   06/23/15 1459  BP: 112/64  Pulse: 68  Temp: 98.3 F (36.8 C)  Resp: 18    Physical Exam  GENERAL APPEARANCE: Alert, No acute distress  SKIN: No diaphoresis rash HEENT:  Unremarkable RESPIRATORY: Breathing is even, unlabored. Lung sounds are clear   CARDIOVASCULAR: Heart RRR no murmurs, rubs or gallops. No peripheral edema  GASTROINTESTINAL: Abdomen is soft, non-tender, not distended w/ normal bowel sounds.  GENITOURINARY: Bladder non tender, not distended  MUSCULOSKELETAL: No abnormal joints or musculature NEUROLOGIC: Cranial nerves 2-12 grossly intact. Moves all extremities PSYCHIATRIC: dementia, no behavioral issues  Patient Active Problem List   Diagnosis Date Noted  . Osteoporosis 06/23/2015  . Bradycardia 09/18/2014  . Prolonged QT interval 09/18/2014  . Hypocalcemia 05/27/2014  . FTT (failure to thrive) in adult 05/27/2014  . Dysphagia, oral phase 04/26/2014  . CKD (chronic kidney disease) stage 2, GFR 60-89 ml/min 04/26/2014  . Type 2  diabetes mellitus without complication ([place]) Q000111Q  . Hyperlipidemia   . Chronic renal disease, stage 2, mildly decreased glomerular filtration rate between 60-89 mL/min/1.73 square meter 10/14/2013  . Cough 06/29/2013  . Hypoglycemia 01/16/2013  . Psychosis 09/20/2012  . Diabetes mellitus type 2, uncontrolled, without complications ([place]) Q000111Q  . Dementia with behavioral disturbance 08/19/2012  . Iron deficiency anemia 08/19/2012  . Depression 08/19/2012  . Malignant neoplasm of female breast ([place]) 06/30/2008  . ADENOCARCINOMA, OVARY 06/29/2008  . Anxiety state 01/16/2007    CBC    Component Value Date/Time   WBC 6.0 11/21/2014   WBC 8.4 09/10/2014 0300   WBC 4.8 10/05/2008 1424   RBC 3.77* 09/10/2014 0300   RBC 3.84 10/05/2008 1424   HGB 12.2 11/21/2014   HGB 11.5* 10/05/2008 1424   HCT 38 11/21/2014   HCT 34.6* 10/05/2008 1424   PLT 279 11/21/2014   PLT 262 10/05/2008 1424   MCV 89.1 09/10/2014 0300   MCV 90.1 10/05/2008 1424   LYMPHSABS 1.4 09/09/2014 1148   LYMPHSABS 1.4 10/05/2008 1424   MONOABS 0.4 09/09/2014 1148   MONOABS 0.3 10/05/2008 1424   EOSABS 0.1 09/09/2014 1148   EOSABS 0.1 10/05/2008 1424   BASOSABS 0.0 09/09/2014 1148   BASOSABS 0.0 10/05/2008 1424    CMP     Component Value Date/Time   NA 143 11/21/2014   NA 142 09/11/2014 0405   K 3.7 11/21/2014   CL 108 09/11/2014 0405   CO2 31 09/11/2014 0405   GLUCOSE 95 09/11/2014 0405   BUN 17 11/21/2014   BUN 12 09/11/2014 0405   CREATININE 0.8 11/21/2014   CREATININE 0.84 09/11/2014 0405   CALCIUM 7.3* 09/11/2014 0405   PROT 5.4* 09/10/2014 0300   ALBUMIN 2.3* 09/10/2014 0300   AST 12* 11/21/2014   ALT 6* 11/21/2014   ALKPHOS 57 11/21/2014   BILITOT 0.2* 09/10/2014 0300   GFRNONAA >60 09/11/2014 0405   GFRAA >60 09/11/2014 0405    Assessment and Plan  Dementia with behavioral disturbance Stable and chronic; cont namenda and aricept   Osteoporosis Chronic and stable with no  fractures;plan - cont oscal with D daily  Depression Pt is stable on remeron 15 mg qHs;plan - cont to monitor for relapse    Hennie Duos, MD

## 2015-06-23 NOTE — Assessment & Plan Note (Signed)
Stable and chronic; cont namenda and aricept

## 2015-06-23 NOTE — Assessment & Plan Note (Signed)
Pt is stable on remeron 15 mg qHs;plan - cont to monitor for relapse

## 2015-06-23 NOTE — Assessment & Plan Note (Signed)
Chronic and stable with no fractures;plan - cont oscal with D daily

## 2015-07-04 LAB — CBC AND DIFFERENTIAL
HCT: 40 % (ref 36–46)
HEMATOCRIT: 40 % (ref 36–46)
HEMOGLOBIN: 12.7 g/dL (ref 12.0–16.0)
Hemoglobin: 12.7 g/dL (ref 12.0–16.0)
PLATELETS: 389 10*3/uL (ref 150–399)
Platelets: 389 10*3/uL (ref 150–399)
WBC: 7.2 10*3/mL
WBC: 7.2 10^3/mL

## 2015-07-04 LAB — LIPID PANEL
CHOLESTEROL: 161 mg/dL (ref 0–200)
Cholesterol: 161 mg/dL (ref 0–200)
HDL: 32 mg/dL — AB (ref 35–70)
HDL: 32 mg/dL — AB (ref 35–70)
LDL CALC: 74 mg/dL
LDL CALC: 74 mg/dL
LDL/HDL RATIO: 5
Triglycerides: 276 mg/dL — AB (ref 40–160)
Triglycerides: 276 mg/dL — AB (ref 40–160)

## 2015-07-04 LAB — TSH: TSH: 3.32 u[IU]/mL (ref 0.41–5.90)

## 2015-07-04 LAB — HEPATIC FUNCTION PANEL
ALT: 5 U/L — AB (ref 7–35)
ALT: 5 U/L — AB (ref 7–35)
AST: 9 U/L — AB (ref 13–35)
AST: 9 U/L — AB (ref 13–35)
Alkaline Phosphatase: 67 U/L (ref 25–125)
Alkaline Phosphatase: 67 U/L (ref 25–125)
Bilirubin, Total: 0.2 mg/dL
Bilirubin, Total: 0.2 mg/dL

## 2015-07-04 LAB — BASIC METABOLIC PANEL
BUN: 18 mg/dL (ref 4–21)
BUN: 18 mg/dL (ref 4–21)
CREATININE: 0.8 mg/dL (ref <=1.1)
Creatinine: 0.8 mg/dL (ref 0.5–1.1)
GLUCOSE: 152 mg/dL
Glucose: 152 mg/dL
POTASSIUM: 4.3 mmol/L (ref 3.4–5.3)
Potassium: 4.3 mmol/L (ref 3.4–5.3)
Sodium: 144 mmol/L (ref 137–147)
Sodium: 144 mmol/L (ref 137–147)

## 2015-07-04 LAB — HEMOGLOBIN A1C
HEMOGLOBIN A1C: 7.2
Hemoglobin A1C: 7.2

## 2015-08-07 ENCOUNTER — Non-Acute Institutional Stay (SKILLED_NURSING_FACILITY): Payer: Medicare Other | Admitting: Internal Medicine

## 2015-08-07 ENCOUNTER — Non-Acute Institutional Stay: Payer: Self-pay

## 2015-08-07 DIAGNOSIS — E785 Hyperlipidemia, unspecified: Secondary | ICD-10-CM | POA: Diagnosis not present

## 2015-08-07 DIAGNOSIS — Z794 Long term (current) use of insulin: Secondary | ICD-10-CM

## 2015-08-07 DIAGNOSIS — D509 Iron deficiency anemia, unspecified: Secondary | ICD-10-CM | POA: Diagnosis not present

## 2015-08-07 DIAGNOSIS — E119 Type 2 diabetes mellitus without complications: Secondary | ICD-10-CM | POA: Diagnosis not present

## 2015-08-07 NOTE — Progress Notes (Signed)
MRN: [id number] Name: Jill Francis  Sex: female Age: 82 y.o. DOB: [birth year]  [place] #: [facility] Facility/Room: [address]  Level Of Care: SNF Provider: Noah Delaine. Sheppard Coil, MD Emergency Contacts: Extended Emergency Contact Information Primary Emergency Contact: [name] [place] of [place] Phone: [phone] Work Phone: [phone] Mobile Phone: [phone] Relation: Niece Secondary Emergency Contact: [place]  [place] Phone: [phone] Relation: None  Code Status: DNR  Allergies: Gabapentin; Iodine; and Sulfonamide derivatives  Chief Complaint  Patient presents with  . Medical Management of Chronic Issues    Routine Visit    HPI: Patient is 82 y.o. female who is being seen for routine issues of DM2, HLD, and anemia.  Past Medical History  Diagnosis Date  . Cancer [facility])     Skin cancer  . Diabetes mellitus   . Depression   . Asthma   . Alzheimer's disease 08/19/2012  . Type II or unspecified type diabetes mellitus without mention of complication, uncontrolled 08/19/2012  . Hyperlipidemia     Past Surgical History  Procedure Laterality Date  . Knee surgery    . Skin cancer excision    . Back surgery    . Breast surgery  right partial mastectomy  06/2003      Medication List       This list is accurate as of: 08/07/15  9:56 PM.  Always use your most recent med list.               calcium-vitamin D 500-200 MG-UNIT tablet  Commonly known as:  OSCAL WITH D  Take 1 tablet by mouth 3 (three) times daily.     donepezil 10 MG tablet  Commonly known as:  ARICEPT  Take 10 mg by mouth at bedtime. For dementia     ferrous sulfate 325 (65 FE) MG tablet  Take 325 mg by mouth daily with breakfast.     memantine 10 MG tablet  Commonly known as:  NAMENDA  Take 10 mg by mouth 2 (two) times daily. For dementia     mirtazapine 15 MG tablet  Commonly known as:  REMERON  Take 15 mg by mouth at bedtime.     NOVOLOG 100  UNIT/ML injection  Generic drug:  insulin aspart  Inject 5 units SQ with lunch and dinner.     sitaGLIPtin 100 MG tablet  Commonly known as:  JANUVIA  Take 100 mg by mouth daily.     TOUJEO SOLOSTAR 300 UNIT/ML Sopn  Generic drug:  Insulin Glargine  Inject 10 Units into the skin. At bedtime.     UNABLE TO FIND  Med Name: Med Pass  Give 90 ml nsa with medication three times a day        Meds ordered this encounter  Medications  . UNABLE TO FIND    Sig: Med Name: Med Pass  Give 90 ml nsa with medication three times a day    Immunization History  Administered Date(s) Administered  . Influenza Whole 12/27/2012  . Influenza-Unspecified 12/27/2013  . PPD Test 06/05/2009  . Pneumococcal Polysaccharide-23 01/17/2013    Social History  Substance Use Topics  . Smoking status: Former Research scientist (life sciences)  . Smokeless tobacco: Not on file  . Alcohol Use: No    Review of Systems  UTO 2/2 dementia    Filed Vitals:   08/07/15 1358  BP: 112/64  Pulse: 68  Temp: 98.3 F (36.8 C)  Resp: 18    Physical Exam  GENERAL APPEARANCE: Alert,  No acute distress SKIN: No diaphoresis rash HEENT: Unremarkable  RESPIRATORY: Breathing is even, unlabored. Lung sounds are clear   CARDIOVASCULAR: Heart RRR no murmurs, rubs or gallops. No peripheral edema  GASTROINTESTINAL: Abdomen is soft, non-tender, not distended w/ normal bowel sounds.  GENITOURINARY: Bladder non tender, not distended  MUSCULOSKELETAL: No abnormal joints or musculature NEUROLOGIC: Cranial nerves 2-12 grossly intact. Moves all extremities PSYCHIATRIC:dementia , no behavioral issues  Patient Active Problem List   Diagnosis Date Noted  . Osteoporosis 06/23/2015  . Bradycardia 09/18/2014  . Prolonged QT interval 09/18/2014  . Hypocalcemia 05/27/2014  . FTT (failure to thrive) in adult 05/27/2014  . Dysphagia, oral phase 04/26/2014  . CKD (chronic kidney disease) stage 2, GFR 60-89 ml/min 04/26/2014  . Type 2 diabetes mellitus  without complication ([place]) Q000111Q  . Hyperlipidemia   . Chronic renal disease, stage 2, mildly decreased glomerular filtration rate between 60-89 mL/min/1.73 square meter 10/14/2013  . Cough 06/29/2013  . Hypoglycemia 01/16/2013  . Psychosis 09/20/2012  . Diabetes mellitus type 2, uncontrolled, without complications ([place]) Q000111Q  . Dementia with behavioral disturbance 08/19/2012  . Iron deficiency anemia 08/19/2012  . Depression 08/19/2012  . Malignant neoplasm of female breast ([place]) 06/30/2008  . ADENOCARCINOMA, OVARY 06/29/2008  . Anxiety state 01/16/2007    CBC    Component Value Date/Time   WBC 7.2 07/04/2015   WBC 7.2 07/04/2015   WBC 8.4 09/10/2014 0300   WBC 4.8 10/05/2008 1424   RBC 3.77* 09/10/2014 0300   RBC 3.84 10/05/2008 1424   HGB 12.7 07/04/2015   HGB 12.7 07/04/2015   HGB 11.5* 10/05/2008 1424   HCT 40 07/04/2015   HCT 40 07/04/2015   HCT 34.6* 10/05/2008 1424   PLT 389 07/04/2015   PLT 389 07/04/2015   PLT 262 10/05/2008 1424   MCV 89.1 09/10/2014 0300   MCV 90.1 10/05/2008 1424   LYMPHSABS 1.4 09/09/2014 1148   LYMPHSABS 1.4 10/05/2008 1424   MONOABS 0.4 09/09/2014 1148   MONOABS 0.3 10/05/2008 1424   EOSABS 0.1 09/09/2014 1148   EOSABS 0.1 10/05/2008 1424   BASOSABS 0.0 09/09/2014 1148   BASOSABS 0.0 10/05/2008 1424    CMP     Component Value Date/Time   NA 144 07/04/2015   NA 144 07/04/2015   NA 142 09/11/2014 0405   K 4.3 07/04/2015   K 4.3 07/04/2015   CL 108 09/11/2014 0405   CO2 31 09/11/2014 0405   GLUCOSE 95 09/11/2014 0405   BUN 18 07/04/2015   BUN 18 07/04/2015   BUN 12 09/11/2014 0405   CREATININE 0.8 07/04/2015   CREATININE 0.8 07/04/2015   CREATININE 0.84 09/11/2014 0405   CALCIUM 7.3* 09/11/2014 0405   PROT 5.4* 09/10/2014 0300   ALBUMIN 2.3* 09/10/2014 0300   AST 9* 07/04/2015   AST 9* 07/04/2015   ALT 5* 07/04/2015   ALT 5* 07/04/2015   ALKPHOS 67 07/04/2015   ALKPHOS 67 07/04/2015   BILITOT 0.2*  09/10/2014 0300   GFRNONAA >60 09/11/2014 0405   GFRAA >60 09/11/2014 0405    Assessment and Plan  Type 2 diabetes mellitus without complication In Q000111Q [id number] 7.2 in [place]; plan - cont current meds  Hyperlipidemia In April 2017 LDL - 74, HDL - 32 on no meds; good control  Iron deficiency anemia Hb 12.7 in 06/2015; stable and improved on daily iron;plan - cont daily iron    Webb Silversmith D. Sheppard Coil, MD

## 2015-08-07 NOTE — Assessment & Plan Note (Signed)
In April 2017 LDL - 74, HDL - 32 on no meds; good control

## 2015-08-07 NOTE — Assessment & Plan Note (Signed)
In 06/2015 A1c 7.2 in [place]; plan - cont current meds

## 2015-08-07 NOTE — Assessment & Plan Note (Signed)
Hb 12.7 in 06/2015; stable and improved on daily iron;plan - cont daily iron

## 2015-08-08 NOTE — Addendum Note (Signed)
Addended by: Logan Bores on: 08/08/2015 11:05 AM   Modules accepted: Level of Service

## 2015-08-08 NOTE — Progress Notes (Signed)
Patient ID: Jill Francis, female   DOB: Nov 17, 1927, 82 y.o.   MRN: [id number]  MRN: [id number] Name: Jill Francis  Sex: female Age: 82 y.o. DOB: Sep 05, 1927  [place] #: [facility] Facility/Room: 216 [address]  Level Of Care: SNF Provider: Noah Delaine. Sheppard Coil, MD Emergency Contacts: Extended Emergency Contact Information Primary Emergency Contact: [name] [place] of [place] Phone: [phone] Work Phone: [phone] Mobile Phone: [phone] Relation: Niece Secondary Emergency Contact: [place]  [place] Phone: [phone] Relation: None  Code Status: DNR  Allergies: Gabapentin; Iodine; and Sulfonamide derivatives  No chief complaint on file.   HPI: Patient is 82 y.o. female who is being seen for routine issues of DM2, HLD, and anemia.  Past Medical History  Diagnosis Date  . Cancer [facility])     Skin cancer  . Diabetes mellitus   . Depression   . Asthma   . Alzheimer's disease 08/19/2012  . Type II or unspecified type diabetes mellitus without mention of complication, uncontrolled 08/19/2012  . Hyperlipidemia     Past Surgical History  Procedure Laterality Date  . Knee surgery    . Skin cancer excision    . Back surgery    . Breast surgery  right partial mastectomy  06/2003      Medication List       This list is accurate as of: 08/07/15 11:59 PM.  Always use your most recent med list.               calcium-vitamin D 500-200 MG-UNIT tablet  Commonly known as:  OSCAL WITH D  Take 1 tablet by mouth 3 (three) times daily.     donepezil 10 MG tablet  Commonly known as:  ARICEPT  Take 10 mg by mouth at bedtime. For dementia     ferrous sulfate 325 (65 FE) MG tablet  Take 325 mg by mouth daily with breakfast.     memantine 10 MG tablet  Commonly known as:  NAMENDA  Take 10 mg by mouth 2 (two) times daily. For dementia     mirtazapine 15 MG tablet  Commonly known as:  REMERON  Take 15 mg by mouth at bedtime.     NOVOLOG 100 UNIT/ML injection  Generic drug:  insulin aspart  Inject 5 units SQ with lunch and dinner.     sitaGLIPtin 100 MG tablet  Commonly known as:  JANUVIA  Take 100 mg by mouth daily.     TOUJEO SOLOSTAR 300 UNIT/ML Sopn  Generic drug:  Insulin Glargine  Inject 10 Units into the skin. At bedtime.     UNABLE TO FIND  Med Name: Med Pass  Give 90 ml nsa with medication three times a day        No orders of the defined types were placed in this encounter.    Immunization History  Administered Date(s) Administered  . Influenza Whole 12/27/2012  . Influenza-Unspecified 12/27/2013  . PPD Test 06/05/2009  . Pneumococcal Polysaccharide-23 01/17/2013    Social History  Substance Use Topics  . Smoking status: Former Research scientist (life sciences)  . Smokeless tobacco: Not on file  . Alcohol Use: No    Review of Systems  UTO 2/2 dementia    There were no vitals filed for this visit.  Physical Exam  GENERAL APPEARANCE: Alert,  No acute distress SKIN: No diaphoresis rash HEENT: Unremarkable  RESPIRATORY: Breathing is even, unlabored. Lung sounds are clear   CARDIOVASCULAR: Heart RRR no murmurs, rubs or gallops.  No peripheral edema  GASTROINTESTINAL: Abdomen is soft, non-tender, not distended w/ normal bowel sounds.  GENITOURINARY: Bladder non tender, not distended  MUSCULOSKELETAL: No abnormal joints or musculature NEUROLOGIC: Cranial nerves 2-12 grossly intact. Moves all extremities PSYCHIATRIC:dementia , no behavioral issues  Patient Active Problem List   Diagnosis Date Noted  . Osteoporosis 06/23/2015  . Bradycardia 09/18/2014  . Prolonged QT interval 09/18/2014  . Hypocalcemia 05/27/2014  . FTT (failure to thrive) in adult 05/27/2014  . Dysphagia, oral phase 04/26/2014  . CKD (chronic kidney disease) stage 2, GFR 60-89 ml/min 04/26/2014  . Type 2 diabetes mellitus without complication ([place]) Q000111Q  . Hyperlipidemia   . Chronic renal disease, stage 2, mildly decreased  glomerular filtration rate between 60-89 mL/min/1.73 square meter 10/14/2013  . Cough 06/29/2013  . Hypoglycemia 01/16/2013  . Psychosis 09/20/2012  . Diabetes mellitus type 2, uncontrolled, without complications ([place]) Q000111Q  . Dementia with behavioral disturbance 08/19/2012  . Iron deficiency anemia 08/19/2012  . Depression 08/19/2012  . Malignant neoplasm of female breast ([place]) 06/30/2008  . ADENOCARCINOMA, OVARY 06/29/2008  . Anxiety state 01/16/2007    CBC    Component Value Date/Time   WBC 7.2 07/04/2015   WBC 7.2 07/04/2015   WBC 8.4 09/10/2014 0300   WBC 4.8 10/05/2008 1424   RBC 3.77* 09/10/2014 0300   RBC 3.84 10/05/2008 1424   HGB 12.7 07/04/2015   HGB 12.7 07/04/2015   HGB 11.5* 10/05/2008 1424   HCT 40 07/04/2015   HCT 40 07/04/2015   HCT 34.6* 10/05/2008 1424   PLT 389 07/04/2015   PLT 389 07/04/2015   PLT 262 10/05/2008 1424   MCV 89.1 09/10/2014 0300   MCV 90.1 10/05/2008 1424   LYMPHSABS 1.4 09/09/2014 1148   LYMPHSABS 1.4 10/05/2008 1424   MONOABS 0.4 09/09/2014 1148   MONOABS 0.3 10/05/2008 1424   EOSABS 0.1 09/09/2014 1148   EOSABS 0.1 10/05/2008 1424   BASOSABS 0.0 09/09/2014 1148   BASOSABS 0.0 10/05/2008 1424    CMP     Component Value Date/Time   NA 144 07/04/2015   NA 144 07/04/2015   NA 142 09/11/2014 0405   K 4.3 07/04/2015   K 4.3 07/04/2015   CL 108 09/11/2014 0405   CO2 31 09/11/2014 0405   GLUCOSE 95 09/11/2014 0405   BUN 18 07/04/2015   BUN 18 07/04/2015   BUN 12 09/11/2014 0405   CREATININE 0.8 07/04/2015   CREATININE 0.8 07/04/2015   CREATININE 0.84 09/11/2014 0405   CALCIUM 7.3* 09/11/2014 0405   PROT 5.4* 09/10/2014 0300   ALBUMIN 2.3* 09/10/2014 0300   AST 9* 07/04/2015   AST 9* 07/04/2015   ALT 5* 07/04/2015   ALT 5* 07/04/2015   ALKPHOS 67 07/04/2015   ALKPHOS 67 07/04/2015   BILITOT 0.2* 09/10/2014 0300   GFRNONAA >60 09/11/2014 0405   GFRAA >60 09/11/2014 0405    Assessment and Plan  No  problem-specific assessment & plan notes found for this encounter.   Noah Delaine. Sheppard Coil, MD

## 2015-08-10 ENCOUNTER — Encounter: Payer: Self-pay | Admitting: Internal Medicine

## 2015-08-15 LAB — MICROALBUMIN, URINE: Microalb, Ur: 1.9

## 2015-09-26 ENCOUNTER — Encounter: Payer: Self-pay | Admitting: Internal Medicine

## 2015-09-26 ENCOUNTER — Non-Acute Institutional Stay (SKILLED_NURSING_FACILITY): Payer: Medicare Other | Admitting: Internal Medicine

## 2015-09-26 DIAGNOSIS — N182 Chronic kidney disease, stage 2 (mild): Secondary | ICD-10-CM | POA: Diagnosis not present

## 2015-09-26 DIAGNOSIS — F29 Unspecified psychosis not due to a substance or known physiological condition: Secondary | ICD-10-CM | POA: Diagnosis not present

## 2015-09-26 NOTE — Assessment & Plan Note (Signed)
In 06/2015 BUN 18/Cr0.84 so stable; will cont monitor at intervals

## 2015-09-26 NOTE — Progress Notes (Signed)
MRN: [id number] Name: Jill Francis  Sex: female Age: 82 y.o. DOB: 1928-01-02  [place] #:  Facility/Room: Level Of Care: SNF Provider: Inocencio Homes D Emergency Contacts: Extended Emergency Contact Information Primary Emergency Contact: [name] [place] of [place] Phone: [phone] Work Phone: [phone] Mobile Phone: [phone] Relation: Niece Secondary Emergency Contact: [place]  [place] Phone: [phone] Relation: None  Code Status:   Allergies: Gabapentin; Iodine; and Sulfonamide derivatives  No chief complaint on file.   HPI: Patient is 82 y.o. female who is being seen for routine issues of hypocalcemia, psychosis and   Past Medical History  Diagnosis Date  . Cancer [facility])     Skin cancer  . Diabetes mellitus   . Depression   . Asthma   . Alzheimer's disease 08/19/2012  . Type II or unspecified type diabetes mellitus without mention of complication, uncontrolled 08/19/2012  . Hyperlipidemia     Past Surgical History  Procedure Laterality Date  . Knee surgery    . Skin cancer excision    . Back surgery    . Breast surgery  right partial mastectomy  06/2003      Medication List       This list is accurate as of: 09/26/15  1:08 PM.  Always use your most recent med list.               calcium-vitamin D 500-200 MG-UNIT tablet  Commonly known as:  OSCAL WITH D  Take 1 tablet by mouth 3 (three) times daily.     donepezil 10 MG tablet  Commonly known as:  ARICEPT  Take 10 mg by mouth at bedtime. For dementia     ferrous sulfate 325 (65 FE) MG tablet  Take 325 mg by mouth daily with breakfast.     memantine 10 MG tablet  Commonly known as:  NAMENDA  Take 10 mg by mouth 2 (two) times daily. For dementia     mirtazapine 15 MG tablet  Commonly known as:  REMERON  Take 15 mg by mouth at bedtime.     NOVOLOG 100 UNIT/ML injection  Generic drug:  insulin aspart  Inject 5 units SQ with lunch and dinner.      sitaGLIPtin 100 MG tablet  Commonly known as:  JANUVIA  Take 100 mg by mouth daily.     TOUJEO SOLOSTAR 300 UNIT/ML Sopn  Generic drug:  Insulin Glargine  Inject 10 Units into the skin. At bedtime.     UNABLE TO FIND  Med Name: Med Pass  Give 90 ml nsa with medication three times a day        No orders of the defined types were placed in this encounter.    Immunization History  Administered Date(s) Administered  . Influenza Whole 12/27/2012  . Influenza-Unspecified 12/27/2013  . PPD Test 06/05/2009  . Pneumococcal Polysaccharide-23 01/17/2013    Social History  Substance Use Topics  . Smoking status: Former Research scientist (life sciences)  . Smokeless tobacco: Not on file  . Alcohol Use: No    Review of Systems   UTO 2/2 dementia; nursing without concerns  There were no vitals filed for this visit.  Physical Exam  GENERAL APPEARANCE: Alert, min conversant, No acute distress  SKIN: No diaphoresis rash HEENT: Unremarkable RESPIRATORY: Breathing is even, unlabored. Lung sounds are clear   CARDIOVASCULAR: Heart RRR no murmurs, rubs or gallops. trace peripheral edema  GASTROINTESTINAL: Abdomen is soft, non-tender, not distended w/ normal bowel sounds.  GENITOURINARY:  Bladder non tender, not distended  MUSCULOSKELETAL: No abnormal joints or musculature NEUROLOGIC: Cranial nerves 2-12 grossly intact. Moves all extremities PSYCHIATRIC: dementia, no behavioral issues  Patient Active Problem List   Diagnosis Date Noted  . Osteoporosis 06/23/2015  . Bradycardia 09/18/2014  . Prolonged QT interval 09/18/2014  . Hypocalcemia 05/27/2014  . FTT (failure to thrive) in adult 05/27/2014  . Dysphagia, oral phase 04/26/2014  . CKD (chronic kidney disease) stage 2, GFR 60-89 ml/min 04/26/2014  . Type 2 diabetes mellitus without complication ([place]) Q000111Q  . Hyperlipidemia   . Chronic renal disease, stage 2, mildly decreased glomerular filtration rate between 60-89 mL/min/1.73 square meter  10/14/2013  . Cough 06/29/2013  . Hypoglycemia 01/16/2013  . Psychosis 09/20/2012  . Diabetes mellitus type 2, uncontrolled, without complications ([place]) Q000111Q  . Dementia with behavioral disturbance 08/19/2012  . Iron deficiency anemia 08/19/2012  . Depression 08/19/2012  . Malignant neoplasm of female breast ([place]) 06/30/2008  . ADENOCARCINOMA, OVARY 06/29/2008  . Anxiety state 01/16/2007    CBC    Component Value Date/Time   WBC 7.2 07/04/2015   WBC 7.2 07/04/2015   WBC 8.4 09/10/2014 0300   WBC 4.8 10/05/2008 1424   RBC 3.77* 09/10/2014 0300   RBC 3.84 10/05/2008 1424   HGB 12.7 07/04/2015   HGB 12.7 07/04/2015   HGB 11.5* 10/05/2008 1424   HCT 40 07/04/2015   HCT 40 07/04/2015   HCT 34.6* 10/05/2008 1424   PLT 389 07/04/2015   PLT 389 07/04/2015   PLT 262 10/05/2008 1424   MCV 89.1 09/10/2014 0300   MCV 90.1 10/05/2008 1424   LYMPHSABS 1.4 09/09/2014 1148   LYMPHSABS 1.4 10/05/2008 1424   MONOABS 0.4 09/09/2014 1148   MONOABS 0.3 10/05/2008 1424   EOSABS 0.1 09/09/2014 1148   EOSABS 0.1 10/05/2008 1424   BASOSABS 0.0 09/09/2014 1148   BASOSABS 0.0 10/05/2008 1424    CMP     Component Value Date/Time   NA 144 07/04/2015   NA 144 07/04/2015   NA 142 09/11/2014 0405   K 4.3 07/04/2015   K 4.3 07/04/2015   CL 108 09/11/2014 0405   CO2 31 09/11/2014 0405   GLUCOSE 95 09/11/2014 0405   BUN 18 07/04/2015   BUN 18 07/04/2015   BUN 12 09/11/2014 0405   CREATININE 0.8 07/04/2015   CREATININE 0.8 07/04/2015   CREATININE 0.84 09/11/2014 0405   CALCIUM 7.3* 09/11/2014 0405   PROT 5.4* 09/10/2014 0300   ALBUMIN 2.3* 09/10/2014 0300   AST 9* 07/04/2015   AST 9* 07/04/2015   ALT 5* 07/04/2015   ALT 5* 07/04/2015   ALKPHOS 67 07/04/2015   ALKPHOS 67 07/04/2015   BILITOT 0.2* 09/10/2014 0300   GFRNONAA >60 09/11/2014 0405   GFRAA >60 09/11/2014 0405    Assessment and Plan  Chronic renal disease, stage 2, mildly decreased glomerular filtration rate  between 60-89 mL/min/1.73 square meter In 06/2015 BUN 18/Cr0.84 so stable; will cont monitor at intervals  Hypocalcemia Ca+ 7.9, still a little low in 06/2015 but pt on 1500 mg of CA from [facility]+ D; will continue  Psychosis Pt exhibits stable mood without psychotic features, although hx in the past;will cont monitor    Hennie Duos, MD

## 2015-09-26 NOTE — Assessment & Plan Note (Signed)
Pt exhibits stable mood without psychotic features, although hx in the past;will cont monitor

## 2015-09-26 NOTE — Assessment & Plan Note (Signed)
Ca+ 7.9, still a little low in 06/2015 but pt on 1500 mg of CA from [facility]+ D; will continue

## 2015-10-03 IMAGING — CR DG CHEST 2V
2 series · 2 of 2 positions shown · non-contrast
Comparison: 01/16/2013

CLINICAL DATA: Hyperglycemia and bradycardia.

EXAM:
CHEST  2 VIEW

[chest lat]
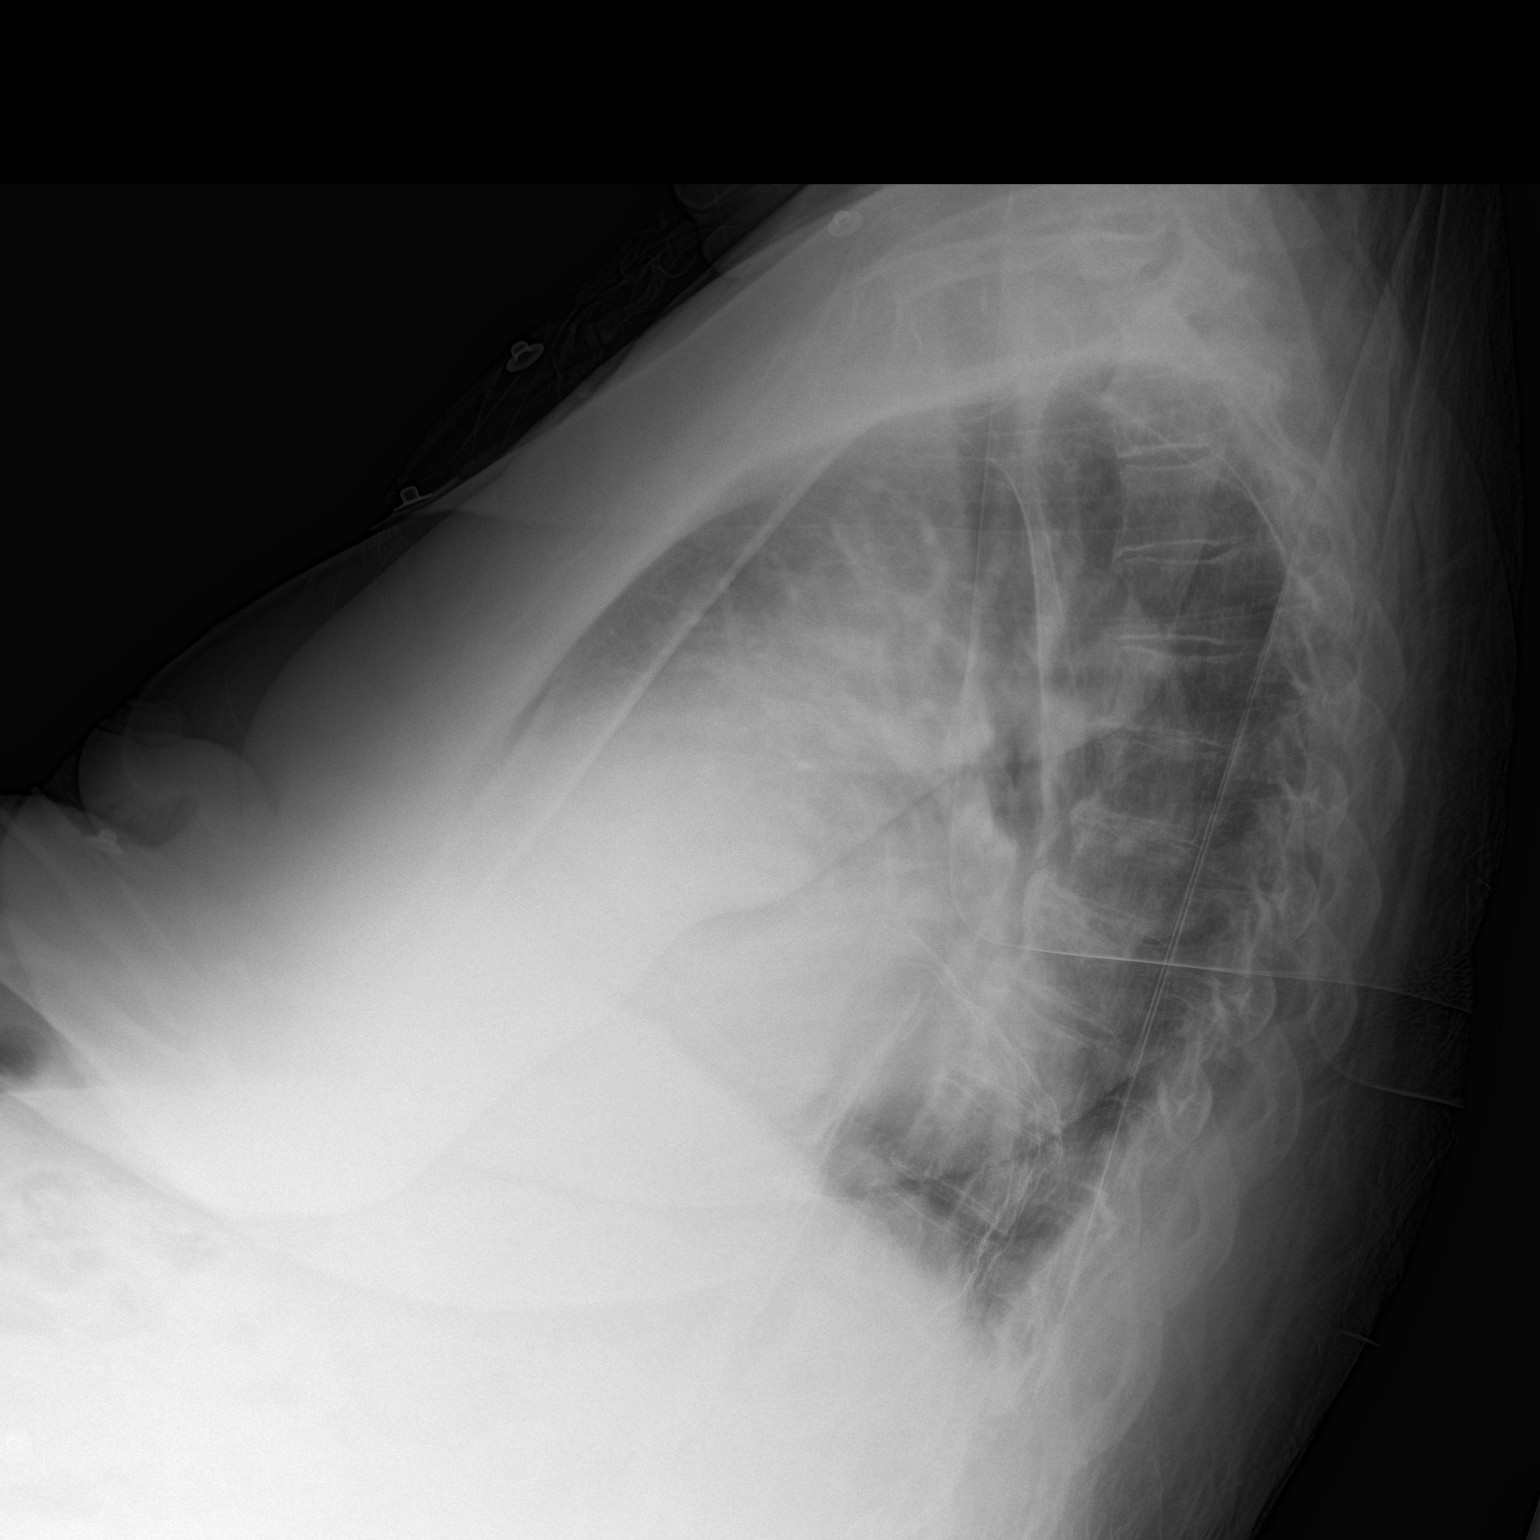

[chest ap]
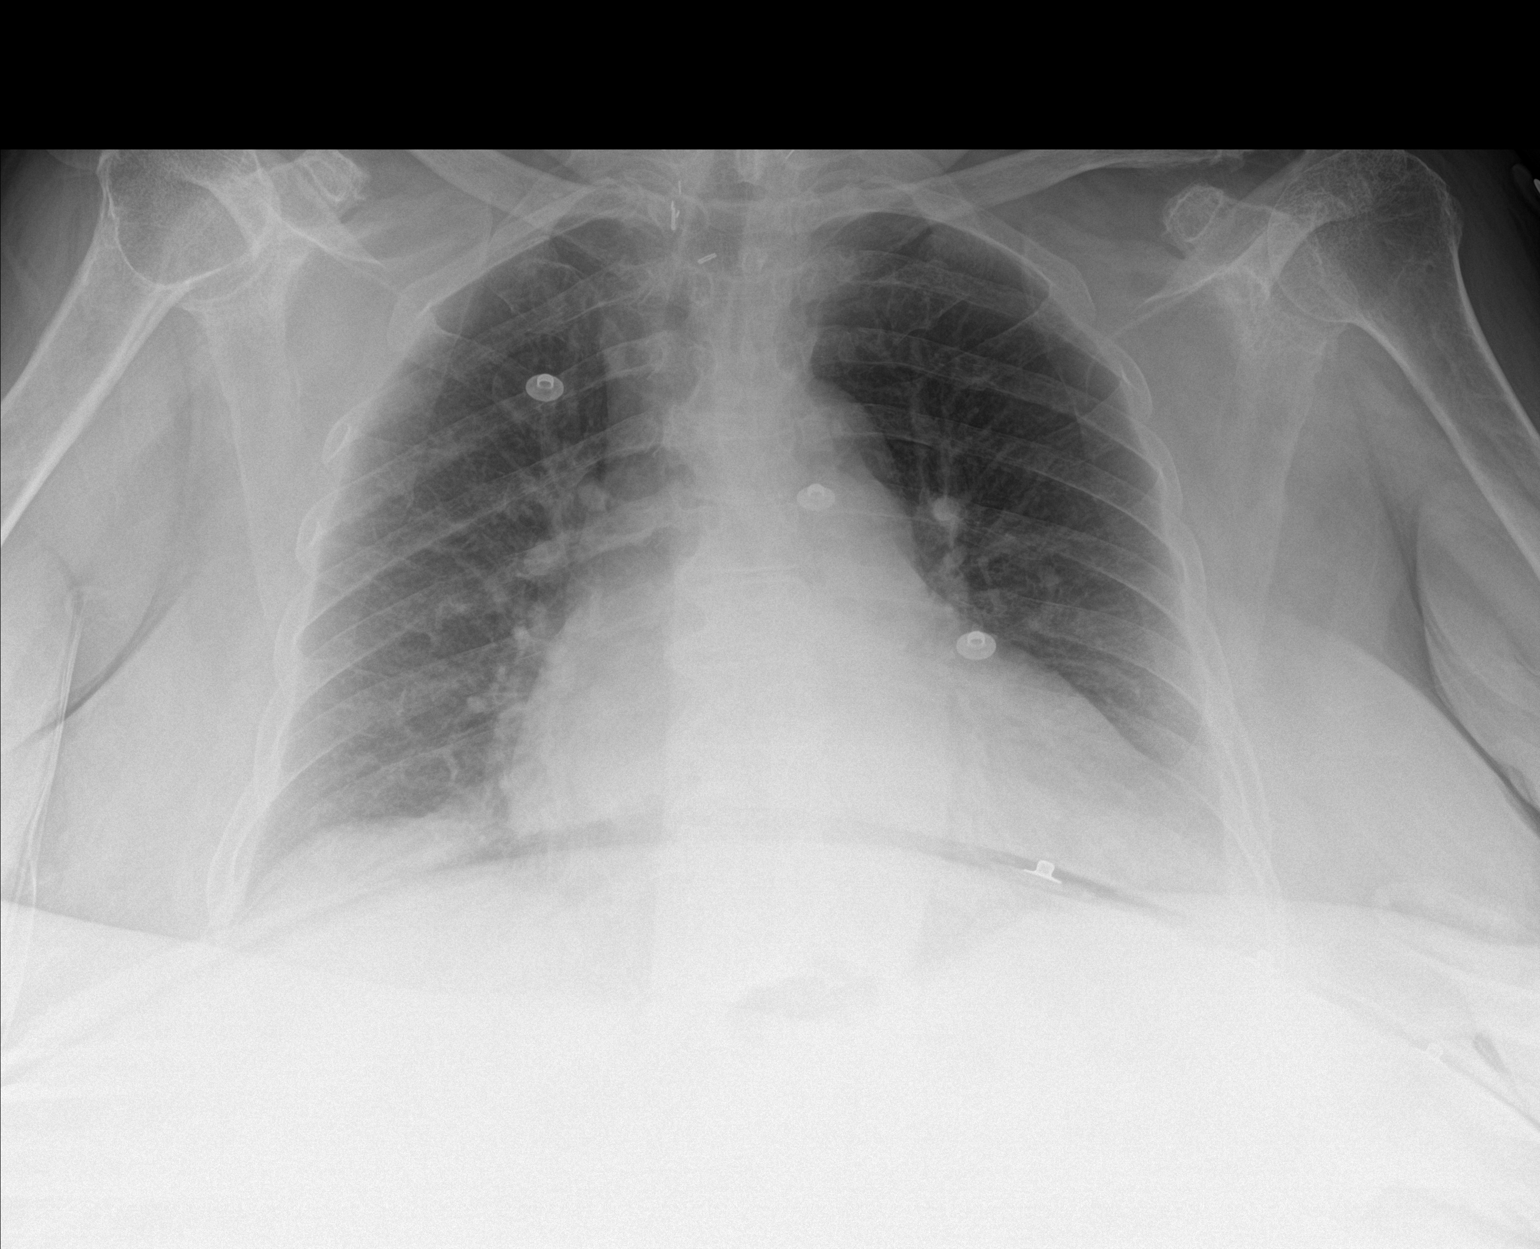

[2 of 2 positions shown; findings below may reference images not displayed]

FINDINGS: Lungs are somewhat hypoinflated without consolidation or effusion.
There is mild cardiomegaly. There are degenerative changes of the
spine. Surgical clips are present over the neck.
IMPRESSION: Hypoinflation without acute cardiopulmonary disease.

## 2015-10-19 ENCOUNTER — Non-Acute Institutional Stay (SKILLED_NURSING_FACILITY): Payer: Medicare Other | Admitting: Internal Medicine

## 2015-10-19 ENCOUNTER — Encounter: Payer: Self-pay | Admitting: Internal Medicine

## 2015-10-19 DIAGNOSIS — M810 Age-related osteoporosis without current pathological fracture: Secondary | ICD-10-CM | POA: Diagnosis not present

## 2015-10-19 DIAGNOSIS — F0391 Unspecified dementia with behavioral disturbance: Secondary | ICD-10-CM | POA: Diagnosis not present

## 2015-10-19 DIAGNOSIS — F329 Major depressive disorder, single episode, unspecified: Secondary | ICD-10-CM

## 2015-10-19 DIAGNOSIS — F32A Depression, unspecified: Secondary | ICD-10-CM

## 2015-10-19 DIAGNOSIS — F03918 Unspecified dementia, unspecified severity, with other behavioral disturbance: Secondary | ICD-10-CM

## 2015-10-19 NOTE — Progress Notes (Signed)
MRN: [id number] Name: Jill Francis  Sex: female Age: 82 y.o. DOB: [birth year]  [place] #:  Facility/Room: [facility] / [address] Level Of Care: SNF Provider: Noah Delaine. Sheppard Coil, MD Emergency Contacts: Extended Emergency Contact Information Primary Emergency Contact: [name] States of [place] Phone: [phone] Work Phone: [phone] Mobile Phone: [phone] Relation: Niece Secondary Emergency Contact: [place]  [place] Phone: [phone] Relation: None  Code Status: DNR  Allergies: Gabapentin; Iodine; and Sulfonamide derivatives  Chief Complaint  Patient presents with  . Medical Management of Chronic Issues    Routine Visit    HPI: Patient is 82 y.o. female who is being seen for routine issues of Dementia, osteoporosis and depression.  Past Medical History:  Diagnosis Date  . Alzheimer's disease 08/19/2012  . Asthma   . Cancer [facility])    Skin cancer  . Depression   . Diabetes mellitus   . Hyperlipidemia   . Type II or unspecified type diabetes mellitus without mention of complication, uncontrolled 08/19/2012    Past Surgical History:  Procedure Laterality Date  . BACK SURGERY    . BREAST SURGERY  right partial mastectomy  06/2003  . KNEE SURGERY    . SKIN CANCER EXCISION        Medication List       Accurate as of 10/19/15 10:37 AM. Always use your most recent med list.          calcium-vitamin D 500-200 MG-UNIT tablet Commonly known as:  OSCAL WITH D Take 1 tablet by mouth 3 (three) times daily with meals.   donepezil 10 MG tablet Commonly known as:  ARICEPT Take 10 mg by mouth at bedtime. For dementia   ferrous sulfate 325 (65 FE) MG tablet Take 325 mg by mouth daily with breakfast.   memantine 10 MG tablet Commonly known as:  NAMENDA Take 10 mg by mouth 2 (two) times daily. For dementia   mirtazapine 15 MG tablet Commonly known as:  REMERON Take 7.5 mg by mouth at bedtime. 1/2 tablet   NOVOLOG 100  UNIT/ML injection Generic drug:  insulin aspart Inject 5 units SQ with lunch and dinner.   sitaGLIPtin 100 MG tablet Commonly known as:  JANUVIA Take 100 mg by mouth daily.   TOUJEO SOLOSTAR 300 UNIT/ML Sopn Generic drug:  Insulin Glargine Inject 10 Units into the skin. At bedtime.   UNABLE TO FIND Med Name: Med Pass  Give 90 ml nsa with medication three times a day       Meds ordered this encounter  Medications  . mirtazapine (REMERON) 15 MG tablet    Sig: Take 7.5 mg by mouth at bedtime. 1/2 tablet    Immunization History  Administered Date(s) Administered  . Influenza Whole 12/27/2012  . Influenza-Unspecified 12/27/2013  . PPD Test 06/05/2009  . Pneumococcal Polysaccharide-23 01/17/2013    Social History  Substance Use Topics  . Smoking status: Former Research scientist (life sciences)  . Smokeless tobacco: Not on file  . Alcohol use No    Review of Systems  UTO 2/2 dementia     Vitals:   10/19/15 1030  BP: 112/64  Pulse: (!) 50  Resp: 18  Temp: 98.3 F (36.8 C)    Physical Exam  GENERAL APPEARANCE: Alert, No acute distress; pt put of room and sitting in hall every day  SKIN: No diaphoresis rash HEENT: Unremarkable RESPIRATORY: Breathing is even, unlabored. Lung sounds are clear   CARDIOVASCULAR: Heart RRR no murmurs, rubs or gallops.  No peripheral edema  GASTROINTESTINAL: Abdomen is soft, non-tender, not distended w/ normal bowel sounds.  GENITOURINARY: Bladder non tender, not distended  MUSCULOSKELETAL: No abnormal joints or musculature NEUROLOGIC: Cranial nerves 2-12 grossly intact. Moves all extremities PSYCHIATRIC: Mood and affect appropriate to situation, no behavioral issues  Patient Active Problem List   Diagnosis Date Noted  . Osteoporosis 06/23/2015  . Bradycardia 09/18/2014  . Prolonged QT interval 09/18/2014  . Hypocalcemia 05/27/2014  . FTT (failure to thrive) in adult 05/27/2014  . Dysphagia, oral phase 04/26/2014  . CKD (chronic kidney disease) stage 2,  GFR 60-89 ml/min 04/26/2014  . Type 2 diabetes mellitus without complication ([place]) Q000111Q  . Hyperlipidemia   . Chronic renal disease, stage 2, mildly decreased glomerular filtration rate between 60-89 mL/min/1.73 square meter 10/14/2013  . Cough 06/29/2013  . Hypoglycemia 01/16/2013  . Psychosis 09/20/2012  . Diabetes mellitus type 2, uncontrolled, without complications ([place]) Q000111Q  . Dementia with behavioral disturbance 08/19/2012  . Iron deficiency anemia 08/19/2012  . Depression 08/19/2012  . Malignant neoplasm of female breast ([place]) 06/30/2008  . ADENOCARCINOMA, OVARY 06/29/2008  . Anxiety state 01/16/2007    CBC    Component Value Date/Time   WBC 7.2 07/04/2015   WBC 7.2 07/04/2015   WBC 8.4 09/10/2014 0300   RBC 3.77 (L) 09/10/2014 0300   HGB 12.7 07/04/2015   HGB 12.7 07/04/2015   HGB 11.5 (L) 10/05/2008 1424   HCT 40 07/04/2015   HCT 40 07/04/2015   HCT 34.6 (L) 10/05/2008 1424   PLT 389 07/04/2015   PLT 389 07/04/2015   PLT 262 10/05/2008 1424   MCV 89.1 09/10/2014 0300   MCV 90.1 10/05/2008 1424   LYMPHSABS 1.4 09/09/2014 1148   LYMPHSABS 1.4 10/05/2008 1424   MONOABS 0.4 09/09/2014 1148   MONOABS 0.3 10/05/2008 1424   EOSABS 0.1 09/09/2014 1148   EOSABS 0.1 10/05/2008 1424   BASOSABS 0.0 09/09/2014 1148   BASOSABS 0.0 10/05/2008 1424    CMP     Component Value Date/Time   NA 144 07/04/2015   NA 144 07/04/2015   K 4.3 07/04/2015   K 4.3 07/04/2015   CL 108 09/11/2014 0405   CO2 31 09/11/2014 0405   GLUCOSE 95 09/11/2014 0405   BUN 18 07/04/2015   BUN 18 07/04/2015   CREATININE 0.8 07/04/2015   CREATININE 0.8 07/04/2015   CREATININE 0.84 09/11/2014 0405   CALCIUM 7.3 (L) 09/11/2014 0405   PROT 5.4 (L) 09/10/2014 0300   ALBUMIN 2.3 (L) 09/10/2014 0300   AST 9 (A) 07/04/2015   AST 9 (A) 07/04/2015   ALT 5 (A) 07/04/2015   ALT 5 (A) 07/04/2015   ALKPHOS 67 07/04/2015   ALKPHOS 67 07/04/2015   BILITOT 0.2 (L) 09/10/2014 0300    GFRNONAA >60 09/11/2014 0405   GFRAA >60 09/11/2014 0405    Assessment and Plan  Dementia with behavioral disturbance Stable and chronic; cont namenda and aricept   Osteoporosis Chronic and stable with no fractures;plan - cont oscal with D daily  Depression Pt is stable on remeron 15 mg qHs;plan - cont to monitor for relapse  No problem-specific Assessment & Plan notes found for this encounter.   Noah Delaine. Sheppard Coil, MD

## 2015-11-21 ENCOUNTER — Encounter: Payer: Self-pay | Admitting: Internal Medicine

## 2015-11-21 ENCOUNTER — Non-Acute Institutional Stay (SKILLED_NURSING_FACILITY): Payer: Medicare Other | Admitting: Internal Medicine

## 2015-11-21 DIAGNOSIS — E119 Type 2 diabetes mellitus without complications: Secondary | ICD-10-CM | POA: Diagnosis not present

## 2015-11-21 DIAGNOSIS — Z794 Long term (current) use of insulin: Secondary | ICD-10-CM

## 2015-11-21 DIAGNOSIS — F0391 Unspecified dementia with behavioral disturbance: Secondary | ICD-10-CM

## 2015-11-21 DIAGNOSIS — R1311 Dysphagia, oral phase: Secondary | ICD-10-CM | POA: Diagnosis not present

## 2015-11-21 DIAGNOSIS — F03918 Unspecified dementia, unspecified severity, with other behavioral disturbance: Secondary | ICD-10-CM

## 2015-11-21 NOTE — Progress Notes (Signed)
MRN: [id number] Name: Jill Francis  Sex: female Age: 82 y.o. DOB: [birth year]  [place] #:  Facility/Room: [facility] / [address] Level Of Care: SNF Provider: Noah Delaine. Sheppard Coil, MD Emergency Contacts: Extended Emergency Contact Information Primary Emergency Contact: [name] [place] of [place] Phone: [phone] Work Phone: [phone] Mobile Phone: [phone] Relation: Niece Secondary Emergency Contact: [place]  [place] Phone: [phone] Relation: None  Code Status: DNR  Allergies: Gabapentin; Iodine; and Sulfonamide derivatives  Chief Complaint  Patient presents with  . Medical Management of Chronic Issues    Routine Visit    HPI: Patient is 82 y.o. female who is being seen for routine issues of dysphagia, DM2 and dementia.  Past Medical History:  Diagnosis Date  . Alzheimer's disease 08/19/2012  . Asthma   . Cancer [facility])    Skin cancer  . Depression   . Diabetes mellitus   . Hyperlipidemia   . Type II or unspecified type diabetes mellitus without mention of complication, uncontrolled 08/19/2012    Past Surgical History:  Procedure Laterality Date  . BACK SURGERY    . BREAST SURGERY  right partial mastectomy  06/2003  . KNEE SURGERY    . SKIN CANCER EXCISION        Medication List       Accurate as of 11/21/15 11:59 PM. Always use your most recent med list.          calcium-vitamin D 500-200 MG-UNIT tablet Commonly known as:  OSCAL WITH D Take 1 tablet by mouth 3 (three) times daily with meals.   donepezil 10 MG tablet Commonly known as:  ARICEPT Take 10 mg by mouth at bedtime. For dementia   ferrous sulfate 325 (65 FE) MG tablet Take 325 mg by mouth daily with breakfast.   HUMALOG KWIKPEN 100 UNIT/ML KiwkPen Generic drug:  insulin lispro Inject 5 Units into the skin 2 (two) times daily. With lunch and dinner   memantine 10 MG tablet Commonly known as:  NAMENDA Take 10 mg by mouth 2 (two) times daily.  For dementia   mirtazapine 15 MG tablet Commonly known as:  REMERON Take 7.5 mg by mouth at bedtime. 1/2 tablet   sitaGLIPtin 100 MG tablet Commonly known as:  JANUVIA Take 100 mg by mouth daily.   TOUJEO SOLOSTAR 300 UNIT/ML Sopn Generic drug:  Insulin Glargine Inject 10 Units into the skin. At bedtime.   UNABLE TO FIND Med Name: Med Pass  Give 90 ml nsa with medication three times a day       Meds ordered this encounter  Medications  . insulin lispro (HUMALOG KWIKPEN) 100 UNIT/ML KiwkPen    Sig: Inject 5 Units into the skin 2 (two) times daily. With lunch and dinner    Immunization History  Administered Date(s) Administered  . Influenza Whole 12/27/2012  . Influenza-Unspecified 12/27/2013  . PPD Test 06/05/2009  . Pneumococcal Polysaccharide-23 01/17/2013    Social History  Substance Use Topics  . Smoking status: Former Research scientist (life sciences)  . Smokeless tobacco: Not on file  . Alcohol use No    Review of Systems  UTO 2/2 dementia; nirsing without concerns    Vitals:   11/21/15 1343  BP: 116/72  Pulse: 72  Resp: 16  Temp: 97.1 F (36.2 C)    Physical Exam  GENERAL APPEARANCE: Alert, min conversant, No acute distress  SKIN: No diaphoresis rash HEENT: Unremarkable RESPIRATORY: Breathing is even, unlabored. Lung sounds are clear   CARDIOVASCULAR:  Heart RRR no murmurs, rubs or gallops. No peripheral edema  GASTROINTESTINAL: Abdomen is soft, non-tender, not distended w/ normal bowel sounds.  GENITOURINARY: Bladder non tender, not distended  MUSCULOSKELETAL: No abnormal joints or musculature NEUROLOGIC: Cranial nerves 2-12 grossly intact. Moves all extremities PSYCHIATRIC: dementia, no behavioral issues  Patient Active Problem List   Diagnosis Date Noted  . Osteoporosis 06/23/2015  . Bradycardia 09/18/2014  . Prolonged QT interval 09/18/2014  . Hypocalcemia 05/27/2014  . FTT (failure to thrive) in adult 05/27/2014  . Dysphagia, oral phase 04/26/2014  . CKD  (chronic kidney disease) stage 2, GFR 60-89 ml/min 04/26/2014  . Type 2 diabetes mellitus without complication ([place]) Q000111Q  . Hyperlipidemia   . Chronic renal disease, stage 2, mildly decreased glomerular filtration rate between 60-89 mL/min/1.73 square meter 10/14/2013  . Cough 06/29/2013  . Hypoglycemia 01/16/2013  . Psychosis 09/20/2012  . Diabetes mellitus type 2, uncontrolled, without complications ([place]) Q000111Q  . Dementia with behavioral disturbance 08/19/2012  . Iron deficiency anemia 08/19/2012  . Depression 08/19/2012  . Malignant neoplasm of female breast ([place]) 06/30/2008  . ADENOCARCINOMA, OVARY 06/29/2008  . Anxiety state 01/16/2007    CBC    Component Value Date/Time   WBC 7.2 07/04/2015   WBC 7.2 07/04/2015   WBC 8.4 09/10/2014 0300   RBC 3.77 (L) 09/10/2014 0300   HGB 12.7 07/04/2015   HGB 12.7 07/04/2015   HGB 11.5 (L) 10/05/2008 1424   HCT 40 07/04/2015   HCT 40 07/04/2015   HCT 34.6 (L) 10/05/2008 1424   PLT 389 07/04/2015   PLT 389 07/04/2015   PLT 262 10/05/2008 1424   MCV 89.1 09/10/2014 0300   MCV 90.1 10/05/2008 1424   LYMPHSABS 1.4 09/09/2014 1148   LYMPHSABS 1.4 10/05/2008 1424   MONOABS 0.4 09/09/2014 1148   MONOABS 0.3 10/05/2008 1424   EOSABS 0.1 09/09/2014 1148   EOSABS 0.1 10/05/2008 1424   BASOSABS 0.0 09/09/2014 1148   BASOSABS 0.0 10/05/2008 1424    CMP     Component Value Date/Time   NA 144 07/04/2015   NA 144 07/04/2015   K 4.3 07/04/2015   K 4.3 07/04/2015   CL 108 09/11/2014 0405   CO2 31 09/11/2014 0405   GLUCOSE 95 09/11/2014 0405   BUN 18 07/04/2015   BUN 18 07/04/2015   CREATININE 0.8 07/04/2015   CREATININE 0.8 07/04/2015   CREATININE 0.84 09/11/2014 0405   CALCIUM 7.3 (L) 09/11/2014 0405   PROT 5.4 (L) 09/10/2014 0300   ALBUMIN 2.3 (L) 09/10/2014 0300   AST 9 (A) 07/04/2015   AST 9 (A) 07/04/2015   ALT 5 (A) 07/04/2015   ALT 5 (A) 07/04/2015   ALKPHOS 67 07/04/2015   ALKPHOS 67 07/04/2015   BILITOT  0.2 (L) 09/10/2014 0300   GFRNONAA >60 09/11/2014 0405   GFRAA >60 09/11/2014 0405    Assessment and Plan  Dysphagia, oral phase No reported aspiration;plan to cont current diet, sofr mechanical,pureed  Type 2 diabetes mellitus without complication BS have been stable, microalbumin 1.9; almost time for new labs; cont januvia 100 mg daily, toujeo 10u daily and novolog 10 u BID ac  Dementia with behavioral disturbance No major declines; cont aricept 10 mg daily and namenda 10 mg BID   Anne D. Sheppard Coil, MD

## 2015-11-24 ENCOUNTER — Encounter: Payer: Self-pay | Admitting: Internal Medicine

## 2015-11-24 NOTE — Assessment & Plan Note (Signed)
No reported aspiration;plan to cont current diet, sofr mechanical,pureed

## 2015-11-24 NOTE — Assessment & Plan Note (Signed)
No major declines; cont aricept 10 mg daily and namenda 10 mg BID

## 2015-11-24 NOTE — Assessment & Plan Note (Signed)
BS have been stable, microalbumin 1.9; almost time for new labs; cont januvia 100 mg daily, toujeo 10u daily and novolog 10 u BID ac

## 2015-12-19 ENCOUNTER — Encounter: Payer: Self-pay | Admitting: Internal Medicine

## 2015-12-19 ENCOUNTER — Non-Acute Institutional Stay (SKILLED_NURSING_FACILITY): Payer: Medicare Other | Admitting: Internal Medicine

## 2015-12-19 DIAGNOSIS — E785 Hyperlipidemia, unspecified: Secondary | ICD-10-CM | POA: Diagnosis not present

## 2015-12-19 DIAGNOSIS — F329 Major depressive disorder, single episode, unspecified: Secondary | ICD-10-CM

## 2015-12-19 DIAGNOSIS — M810 Age-related osteoporosis without current pathological fracture: Secondary | ICD-10-CM

## 2015-12-19 DIAGNOSIS — F32A Depression, unspecified: Secondary | ICD-10-CM

## 2015-12-19 NOTE — Progress Notes (Signed)
Location:  [place] Room Number: [place] of Service:  SNF ([phone])  Jill Francis. Sheppard Coil, MD  No care team member to display  Extended Emergency Contact Information Primary Emergency Contact: [name] [place] of [place] Phone: [phone] Work Phone: [phone] Mobile Phone: [phone] Relation: Niece Secondary Emergency Contact: [place] of [place] Phone: [phone] Relation: None    Allergies: Gabapentin; Iodine; and Sulfonamide derivatives  Chief Complaint  Patient presents with  . Medical Management of Chronic Issues    Routine Visit    HPI: Patient is 82 y.o. female who is being seen for routine issues of depression, ostoporosis and HLD.  Past Medical History:  Diagnosis Date  . Alzheimer's disease 08/19/2012  . Asthma   . Cancer [facility])    Skin cancer  . Depression   . Diabetes mellitus   . Hyperlipidemia   . Type II or unspecified type diabetes mellitus without mention of complication, uncontrolled 08/19/2012    Past Surgical History:  Procedure Laterality Date  . BACK SURGERY    . BREAST SURGERY  right partial mastectomy  06/2003  . KNEE SURGERY    . SKIN CANCER EXCISION        Medication List       Accurate as of 12/19/15 11:59 PM. Always use your most recent med list.          calcium-vitamin D 500-200 MG-UNIT tablet Commonly known as:  OSCAL WITH D Take 1 tablet by mouth 3 (three) times daily with meals.   donepezil 10 MG tablet Commonly known as:  ARICEPT Take 10 mg by mouth at bedtime. For dementia   ferrous sulfate 325 (65 FE) MG tablet Take 325 mg by mouth daily with breakfast.   HUMALOG KWIKPEN 100 UNIT/ML KiwkPen Generic drug:  insulin lispro Inject 5 Units into the skin 2 (two) times daily. With lunch and dinner   memantine 10 MG tablet Commonly known as:  NAMENDA Take 10 mg by mouth 2 (two) times daily. For dementia   mirtazapine 15 MG  tablet Commonly known as:  REMERON Take 7.5 mg by mouth at bedtime. 1/2 tablet   sitaGLIPtin 100 MG tablet Commonly known as:  JANUVIA Take 100 mg by mouth daily.   TOUJEO SOLOSTAR 300 UNIT/ML Sopn Generic drug:  Insulin Glargine Inject 10 Units into the skin at bedtime.   UNABLE TO FIND Med Name: Med Pass  Give 90 ml nsa with medication three times a day       No orders of the defined types were placed in this encounter.   Immunization History  Administered Date(s) Administered  . Influenza Whole 12/27/2012  . Influenza-Unspecified 12/27/2013  . PPD Test 06/05/2009  . Pneumococcal Polysaccharide-23 01/17/2013    Social History  Substance Use Topics  . Smoking status: Former Research scientist (life sciences)  . Smokeless tobacco: Not on file  . Alcohol use No    Review of Systems  UTO 2/2 dementia    Vitals:   12/19/15 0931  BP: 116/72  Pulse: 76  Resp: 20  Temp: 97.1 F (36.2 C)   Body mass index is 29.56 kg/m. Physical Exam  GENERAL APPEARANCE: Alert, min conversant, No acute distress  SKIN: No diaphoresis rash HEENT: Unremarkable RESPIRATORY: Breathing is even, unlabored. Lung sounds are clear   CARDIOVASCULAR: Heart RRR no murmurs, rubs or gallops. No peripheral edema  GASTROINTESTINAL: Abdomen is soft, non-tender, not distended w/ normal bowel sounds.  GENITOURINARY: Bladder non  tender, not distended  MUSCULOSKELETAL: No abnormal joints or musculature NEUROLOGIC: Cranial nerves 2-12 grossly intact. Moves all extremities PSYCHIATRIC: dementia, no behavioral issues  Patient Active Problem List   Diagnosis Date Noted  . Osteoporosis 06/23/2015  . Bradycardia 09/18/2014  . Prolonged QT interval 09/18/2014  . Hypocalcemia 05/27/2014  . FTT (failure to thrive) in adult 05/27/2014  . Dysphagia, oral phase 04/26/2014  . CKD (chronic kidney disease) stage 2, GFR 60-89 ml/min 04/26/2014  . Type 2 diabetes mellitus without complication ([place]) Q000111Q  . Hyperlipidemia   .  Chronic renal disease, stage 2, mildly decreased glomerular filtration rate between 60-89 mL/min/1.73 square meter 10/14/2013  . Cough 06/29/2013  . Hypoglycemia 01/16/2013  . Psychosis 09/20/2012  . Diabetes mellitus type 2, uncontrolled, without complications ([place]) Q000111Q  . Dementia with behavioral disturbance 08/19/2012  . Iron deficiency anemia 08/19/2012  . Depression 08/19/2012  . Malignant neoplasm of female breast ([place]) 06/30/2008  . ADENOCARCINOMA, OVARY 06/29/2008  . Anxiety state 01/16/2007    CMP     Component Value Date/Time   NA 144 07/04/2015   NA 144 07/04/2015   K 4.3 07/04/2015   K 4.3 07/04/2015   CL 108 09/11/2014 0405   CO2 31 09/11/2014 0405   GLUCOSE 95 09/11/2014 0405   BUN 18 07/04/2015   BUN 18 07/04/2015   CREATININE 0.8 07/04/2015   CREATININE 0.8 07/04/2015   CREATININE 0.84 09/11/2014 0405   CALCIUM 7.3 (L) 09/11/2014 0405   PROT 5.4 (L) 09/10/2014 0300   ALBUMIN 2.3 (L) 09/10/2014 0300   AST 9 (A) 07/04/2015   AST 9 (A) 07/04/2015   ALT 5 (A) 07/04/2015   ALT 5 (A) 07/04/2015   ALKPHOS 67 07/04/2015   ALKPHOS 67 07/04/2015   BILITOT 0.2 (L) 09/10/2014 0300   GFRNONAA >60 09/11/2014 0405   GFRAA >60 09/11/2014 0405    Recent Labs  07/04/15  NA 144  144  K 4.3  4.3  BUN 18  18  CREATININE 0.8  0.8    Recent Labs  07/04/15  AST 9*  9*  ALT 5*  5*  ALKPHOS 67  67    Recent Labs  07/04/15  WBC 7.2  7.2  HGB 12.7  12.7  HCT 40  40  PLT 389  389    Recent Labs  07/04/15  CHOL 161  161  LDLCALC 74  74  TRIG 276*  276*   Lab Results  Component Value Date   MICROALBUR 1.9 08/15/2015   Lab Results  Component Value Date   TSH 3.32 07/04/2015   Lab Results  Component Value Date   HGBA1C 7.2 07/04/2015   HGBA1C 7.2 07/04/2015   Lab Results  Component Value Date   CHOL 161 07/04/2015   CHOL 161 07/04/2015   HDL 32 (A) 07/04/2015   HDL 32 (A) 07/04/2015   LDLCALC 74 07/04/2015   LDLCALC 74  07/04/2015   TRIG 276 (A) 07/04/2015   TRIG 276 (A) 07/04/2015    Significant Diagnostic Results in last 30 days:  No results found.  Assessment and Plan  Depression Unchanged; cont remeron 15 mg qHS  Osteoporosis Sttable, no fractures ;plan to cont Oscal 500-200 1 po daily  Hyperlipidemia Has been controlled on no meds;time for new labs.   Labs/tests ordered: CMP, CBC, FLP, HbA1c, TSH, Vit D   Greene Diodato D. Sheppard Coil, MD

## 2015-12-24 LAB — LIPID PANEL
CHOLESTEROL: 140 mg/dL (ref 0–200)
HDL: 37 mg/dL (ref 35–70)
LDL CALC: 82 mg/dL
TRIGLYCERIDES: 106 mg/dL (ref 40–160)

## 2015-12-24 LAB — HEPATIC FUNCTION PANEL
ALK PHOS: 0.3 U/L — AB (ref 25–125)
ALK PHOS: 47 U/L (ref 25–125)
ALT: 4 U/L — AB (ref 7–35)
AST: 9 U/L — AB (ref 13–35)
Alkaline Phosphatase: 0 U/L — AB (ref 25–125)
Bilirubin, Total: 0.3 mg/dL
Bilirubin, Total: 47 mg/dL

## 2015-12-24 LAB — HEMOGLOBIN A1C: HEMOGLOBIN A1C: 6.8

## 2015-12-24 LAB — BASIC METABOLIC PANEL
BUN: 20 mg/dL (ref 4–21)
Creatinine: 0.9 mg/dL (ref 0.5–1.1)
Glucose: 118 mg/dL
Potassium: 4 mmol/L (ref 3.4–5.3)
Sodium: 144 mmol/L (ref 137–147)

## 2015-12-24 LAB — TSH: TSH: 6.15 u[IU]/mL — AB (ref 0.41–5.90)

## 2015-12-24 LAB — CBC AND DIFFERENTIAL
HEMATOCRIT: 33 % — AB (ref 36–46)
HEMOGLOBIN: 11.1 g/dL — AB (ref 12.0–16.0)
PLATELETS: 275 10*3/uL (ref 150–399)
WBC: 4.9 10*3/mL

## 2015-12-25 LAB — TSH: TSH: 4.29 u[IU]/mL (ref 0.41–5.90)

## 2016-01-03 ENCOUNTER — Encounter: Payer: Self-pay | Admitting: Internal Medicine

## 2016-01-03 NOTE — Assessment & Plan Note (Signed)
Sttable, no fractures ;plan to cont Oscal 500-200 1 po daily

## 2016-01-03 NOTE — Assessment & Plan Note (Signed)
Has been controlled on no meds;time for new labs.

## 2016-01-03 NOTE — Assessment & Plan Note (Signed)
Unchanged; cont remeron 15 mg qHS

## 2016-01-18 ENCOUNTER — Encounter: Payer: Self-pay | Admitting: Internal Medicine

## 2016-01-18 ENCOUNTER — Non-Acute Institutional Stay (SKILLED_NURSING_FACILITY): Payer: Medicare Other | Admitting: Internal Medicine

## 2016-01-18 DIAGNOSIS — E119 Type 2 diabetes mellitus without complications: Secondary | ICD-10-CM

## 2016-01-18 DIAGNOSIS — Z794 Long term (current) use of insulin: Secondary | ICD-10-CM

## 2016-01-18 DIAGNOSIS — D508 Other iron deficiency anemias: Secondary | ICD-10-CM

## 2016-01-18 DIAGNOSIS — N182 Chronic kidney disease, stage 2 (mild): Secondary | ICD-10-CM

## 2016-01-18 NOTE — Progress Notes (Signed)
Location:  [place] Room Number: [phone] Place of Service:  SNF [phone])  Jill Francis. Jill Coil, MD  No care team member to display  Extended Emergency Contact Information Primary Emergency Contact: [name] [place] of [place] Phone: [phone] Work Phone: [phone] Mobile Phone: [phone] Relation: Niece Secondary Emergency Contact: [place] of [place] Phone: [phone] Relation: None    Allergies: Gabapentin; Iodine; and Sulfonamide derivatives  Chief Complaint  Patient presents with  . Medical Management of Chronic Issues    Routine Visit    HPI: Patient is 82 y.o. female who is being seen for routine issues of DM2, anemia and CKD2.  Past Medical History:  Diagnosis Date  . Alzheimer's disease 08/19/2012  . Asthma   . Cancer [facility])    Skin cancer  . Depression   . Diabetes mellitus   . Hyperlipidemia   . Type II or unspecified type diabetes mellitus without mention of complication, uncontrolled 08/19/2012    Past Surgical History:  Procedure Laterality Date  . BACK SURGERY    . BREAST SURGERY  right partial mastectomy  06/2003  . KNEE SURGERY    . SKIN CANCER EXCISION        Medication List       Accurate as of 01/18/16 11:59 PM. Always use your most recent med list.          calcium-vitamin D 500-200 MG-UNIT tablet Commonly known as:  OSCAL WITH D Take 1 tablet by mouth 3 (three) times daily with meals.   donepezil 10 MG tablet Commonly known as:  ARICEPT Take 10 mg by mouth at bedtime. For dementia   ferrous sulfate 325 (65 FE) MG tablet Take 325 mg by mouth daily with breakfast.   HUMALOG KWIKPEN 100 UNIT/ML KiwkPen Generic drug:  insulin lispro Inject 5 Units into the skin 2 (two) times daily. With lunch and dinner   memantine 10 MG tablet Commonly known as:  NAMENDA Take 10 mg by mouth 2 (two) times daily. For dementia   mirtazapine 15 MG tablet Commonly known  as:  REMERON Take 7.5 mg by mouth at bedtime. 1/2 tablet   sitaGLIPtin 100 MG tablet Commonly known as:  JANUVIA Take 100 mg by mouth daily.   TOUJEO SOLOSTAR 300 UNIT/ML Sopn Generic drug:  Insulin Glargine Inject 10 Units into the skin at bedtime.   UNABLE TO FIND Med Name: Med Pass  Give 90 ml nsa with medication three times a day       No orders of the defined types were placed in this encounter.   Immunization History  Administered Date(s) Administered  . Influenza Whole 12/27/2012  . Influenza-Unspecified 12/27/2013, 01/03/2016  . PPD Test 06/05/2009  . Pneumococcal Polysaccharide-23 01/17/2013    Social History  Substance Use Topics  . Smoking status: Former Research scientist (life sciences)  . Smokeless tobacco: Not on file  . Alcohol use No    Review of Systems  UTO 2/2 dementia    Vitals:   01/18/16 1001  BP: 116/72  Pulse: 72  Resp: 18  Temp: 97.1 F (36.2 C)   Body mass index is 28.46 kg/m. Physical Exam  GENERAL APPEARANCE: Alert, No acute distress  SKIN: No diaphoresis rash HEENT: Unremarkable RESPIRATORY: Breathing is even, unlabored. Lung sounds are clear   CARDIOVASCULAR: Heart RRR no murmurs, rubs or gallops. No peripheral edema  GASTROINTESTINAL: Abdomen is soft, non-tender, not distended w/ normal bowel sounds.  GENITOURINARY: Bladder non tender, not  distended  MUSCULOSKELETAL: No abnormal joints or musculature NEUROLOGIC: Cranial nerves 2-12 grossly intact. Moves all extremities PSYCHIATRIC: Mood and affect appropriate to situation with dementia, no behavioral issues  Patient Active Problem List   Diagnosis Date Noted  . Osteoporosis 06/23/2015  . Bradycardia 09/18/2014  . Prolonged QT interval 09/18/2014  . Hypocalcemia 05/27/2014  . FTT (failure to thrive) in adult 05/27/2014  . Dysphagia, oral phase 04/26/2014  . CKD (chronic kidney disease) stage 2, GFR 60-89 ml/min 04/26/2014  . Type 2 diabetes mellitus without complication ([place]) Q000111Q  .  Hyperlipidemia   . Chronic renal disease, stage 2, mildly decreased glomerular filtration rate between 60-89 mL/min/1.73 square meter 10/14/2013  . Cough 06/29/2013  . Hypoglycemia 01/16/2013  . Psychosis 09/20/2012  . Diabetes mellitus type 2, uncontrolled, without complications ([place]) Q000111Q  . Dementia with behavioral disturbance 08/19/2012  . Iron deficiency anemia 08/19/2012  . Depression 08/19/2012  . Malignant neoplasm of female breast ([place]) 06/30/2008  . ADENOCARCINOMA, OVARY 06/29/2008  . Anxiety state 01/16/2007    CMP     Component Value Date/Time   NA 144 12/24/2015   K 4.0 12/24/2015   CL 108 09/11/2014 0405   CO2 31 09/11/2014 0405   GLUCOSE 95 09/11/2014 0405   BUN 20 12/24/2015   CREATININE 0.9 12/24/2015   CREATININE 0.84 09/11/2014 0405   CALCIUM 7.3 (L) 09/11/2014 0405   PROT 5.4 (L) 09/10/2014 0300   ALBUMIN 2.3 (L) 09/10/2014 0300   AST 9 (A) 12/24/2015   ALT 4 (A) 12/24/2015   ALKPHOS 0.3 (A) 12/24/2015   ALKPHOS 0 (A) 12/24/2015   ALKPHOS 47 12/24/2015   BILITOT 0.2 (L) 09/10/2014 0300   GFRNONAA >60 09/11/2014 0405   GFRAA >60 09/11/2014 0405    Recent Labs  07/04/15 12/24/15  NA 144  144 144  K 4.3  4.3 4.0  BUN 18  18 20   CREATININE 0.8  0.8 0.9    Recent Labs  07/04/15 12/24/15  AST 9*  9* 9*  ALT 5*  5* 4*  ALKPHOS 67  67 47  0*  0.3*    Recent Labs  07/04/15 12/24/15  WBC 7.2  7.2 4.9  HGB 12.7  12.7 11.1*  HCT 40  40 33*  PLT 389  389 275    Recent Labs  07/04/15 12/24/15  CHOL 161  161 140  LDLCALC 74  74 82  TRIG 276*  276* 106   Lab Results  Component Value Date   MICROALBUR 1.9 08/15/2015   Lab Results  Component Value Date   TSH 4.29 12/25/2015   Lab Results  Component Value Date   HGBA1C 6.8 12/24/2015   Lab Results  Component Value Date   CHOL 140 12/24/2015   HDL 37 12/24/2015   LDLCALC 82 12/24/2015   TRIG 106 12/24/2015    Significant Diagnostic Results in last 30 days:    No results found.  Assessment and Plan  Type 2 diabetes mellitus without complication 123456 was 6.8, good control; plan to cont current regimen of januvia 100 mg daily, toujeo 10 u daily and novolog 10 u BID ac  Iron deficiency anemia Most recent H/H 11.1/33, a little lower than prior; plan to cont iron daily;will monitor at intervals   Chronic renal disease, stage 2, mildly decreased glomerular filtration rate between 60-89 mL/min/1.73 square meter Recent BUN/Cr 20/0.9, stable from prior; will monitor at [employer] D. Jill Coil, MD

## 2016-01-20 ENCOUNTER — Encounter: Payer: Self-pay | Admitting: Internal Medicine

## 2016-01-20 NOTE — Assessment & Plan Note (Signed)
Recent BUN/Cr 20/0.9, stable from prior; will monitor at intervasl

## 2016-01-20 NOTE — Assessment & Plan Note (Addendum)
A1c was 6.8, good control; plan to cont current regimen of januvia 100 mg daily, toujeo 10 u daily and novolog 10 u BID ac

## 2016-01-20 NOTE — Assessment & Plan Note (Signed)
Most recent H/H 11.1/33, a little lower than prior; plan to cont iron daily;will monitor at intervals

## 2016-02-19 ENCOUNTER — Non-Acute Institutional Stay (SKILLED_NURSING_FACILITY): Payer: Medicare Other | Admitting: Internal Medicine

## 2016-02-19 ENCOUNTER — Encounter: Payer: Self-pay | Admitting: Internal Medicine

## 2016-02-19 DIAGNOSIS — R1311 Dysphagia, oral phase: Secondary | ICD-10-CM

## 2016-02-19 DIAGNOSIS — F32A Depression, unspecified: Secondary | ICD-10-CM

## 2016-02-19 DIAGNOSIS — G301 Alzheimer's disease with late onset: Secondary | ICD-10-CM

## 2016-02-19 DIAGNOSIS — F0281 Dementia in other diseases classified elsewhere with behavioral disturbance: Secondary | ICD-10-CM

## 2016-02-19 DIAGNOSIS — F02818 Dementia in other diseases classified elsewhere, unspecified severity, with other behavioral disturbance: Secondary | ICD-10-CM

## 2016-02-19 DIAGNOSIS — F329 Major depressive disorder, single episode, unspecified: Secondary | ICD-10-CM | POA: Diagnosis not present

## 2016-02-19 NOTE — Progress Notes (Signed)
Location:  [place] Room Number: [phone] Place of Service:  SNF [phone])  Noah Delaine. Sheppard Coil, MD  No care team member to display  Extended Emergency Contact Information Primary Emergency Contact: [name] [place] of [place] Phone: [phone] Work Phone: [phone] Mobile Phone: [phone] Relation: Niece Secondary Emergency Contact: [place] of [place] Phone: [phone] Relation: None    Allergies: Gabapentin; Iodine; and Sulfonamide derivatives  Chief Complaint  Patient presents with  . Medical Management of Chronic Issues    Routine Visit    HPI: Patient is 82 y.o. female who is being seen for routine issues of dysphagia, dementia and depression.  Past Medical History:  Diagnosis Date  . Alzheimer's disease 08/19/2012  . Asthma   . Breast cancer ([place])   . Cancer [facility])    Skin cancer  . Depression   . Diabetes mellitus   . Hyperlipidemia   . Ovarian cancer ([place])   . Type II or unspecified type diabetes mellitus without mention of complication, uncontrolled 08/19/2012    Past Surgical History:  Procedure Laterality Date  . BACK SURGERY    . BREAST SURGERY  right partial mastectomy  06/2003  . KNEE SURGERY    . SKIN CANCER EXCISION      Allergies as of 02/19/2016      Reactions   Gabapentin    Per mar   Iodine    Per mar   Sulfonamide Derivatives    Per mar      Medication List       Accurate as of 02/19/16 11:59 PM. Always use your most recent med list.          calcium-vitamin D 500-200 MG-UNIT tablet Commonly known as:  OSCAL WITH D Take 1 tablet by mouth 3 (three) times daily with meals.   donepezil 10 MG tablet Commonly known as:  ARICEPT Take 10 mg by mouth at bedtime. For dementia   ferrous sulfate 220 (44 Fe) MG/5ML solution Take 220 mg by mouth daily.   HUMALOG KWIKPEN 100 UNIT/ML KiwkPen Generic drug:  insulin lispro Inject 5 Units into the skin 2 (two)  times daily. With lunch and dinner   memantine 10 MG tablet Commonly known as:  NAMENDA Take 10 mg by mouth 2 (two) times daily. For dementia   mirtazapine 15 MG tablet Commonly known as:  REMERON Take 7.5 mg by mouth at bedtime. 1/2 tablet   sitaGLIPtin 100 MG tablet Commonly known as:  JANUVIA Take 100 mg by mouth daily.   TOUJEO SOLOSTAR 300 UNIT/ML Sopn Generic drug:  Insulin Glargine Inject 10 Units into the skin at bedtime.   UNABLE TO FIND Med Name: Med Pass  Give 90 ml nsa with medication three times a day       Meds ordered this encounter  Medications  . ferrous sulfate 220 (44 Fe) MG/5ML solution    Sig: Take 220 mg by mouth daily.    Immunization History  Administered Date(s) Administered  . Influenza Whole 12/27/2012  . Influenza-Unspecified 12/27/2013, 01/03/2016  . PPD Test 06/05/2009  . Pneumococcal Polysaccharide-23 01/17/2013    Social History  Substance Use Topics  . Smoking status: Former Research scientist (life sciences)  . Smokeless tobacco: Not on file  . Alcohol use No    Review of Systems UTO 2/2 dementia   Vitals:   02/19/16 0942  BP: 116/72  Pulse: 77  Resp: 16  Temp: 97.1 F (36.2 C)   Body  mass index is 29.52 kg/m. Physical Exam  GENERAL APPEARANCE: Alert, mod conversant, No acute distress  SKIN: No diaphoresis rash HEENT: Unremarkable RESPIRATORY: Breathing is even, unlabored. Lung sounds are clear   CARDIOVASCULAR: Heart RRR no murmurs, rubs or gallops. No peripheral edema  GASTROINTESTINAL: Abdomen is soft, non-tender, not distended w/ normal bowel sounds.  GENITOURINARY: Bladder non tender, not distended  MUSCULOSKELETAL: No abnormal joints or musculature NEUROLOGIC: Cranial nerves 2-12 grossly intact. Moves all extremities PSYCHIATRIC: dementia, no behavioral issues  Patient Active Problem List   Diagnosis Date Noted  . Breast cancer ([place])   . Osteoporosis 06/23/2015  . Bradycardia 09/18/2014  . Prolonged QT interval 09/18/2014  .  Hypocalcemia 05/27/2014  . FTT (failure to thrive) in adult 05/27/2014  . Dysphagia, oral phase 04/26/2014  . CKD (chronic kidney disease) stage 2, GFR 60-89 ml/min 04/26/2014  . Type 2 diabetes mellitus without complication ([place]) Q000111Q  . Hyperlipidemia   . Chronic renal disease, stage 2, mildly decreased glomerular filtration rate between 60-89 mL/min/1.73 square meter 10/14/2013  . Cough 06/29/2013  . Hypoglycemia 01/16/2013  . Psychosis 09/20/2012  . Diabetes mellitus type 2, uncontrolled, without complications ([place]) Q000111Q  . Dementia with behavioral disturbance 08/19/2012  . Iron deficiency anemia 08/19/2012  . Depression 08/19/2012  . Malignant neoplasm of female breast ([place]) 06/30/2008  . ADENOCARCINOMA, OVARY 06/29/2008  . Anxiety state 01/16/2007    CMP     Component Value Date/Time   NA 144 12/24/2015   K 4.0 12/24/2015   CL 108 09/11/2014 0405   CO2 31 09/11/2014 0405   GLUCOSE 95 09/11/2014 0405   BUN 20 12/24/2015   CREATININE 0.9 12/24/2015   CREATININE 0.84 09/11/2014 0405   CALCIUM 7.3 (L) 09/11/2014 0405   PROT 5.4 (L) 09/10/2014 0300   ALBUMIN 2.3 (L) 09/10/2014 0300   AST 9 (A) 12/24/2015   ALT 4 (A) 12/24/2015   ALKPHOS 0.3 (A) 12/24/2015   ALKPHOS 0 (A) 12/24/2015   ALKPHOS 47 12/24/2015   BILITOT 0.2 (L) 09/10/2014 0300   GFRNONAA >60 09/11/2014 0405   GFRAA >60 09/11/2014 0405    Recent Labs  07/04/15 12/24/15  NA 144  144 144  K 4.3  4.3 4.0  BUN 18  18 20   CREATININE 0.8  0.8 0.9    Recent Labs  07/04/15 12/24/15  AST 9*  9* 9*  ALT 5*  5* 4*  ALKPHOS 67  67 47  0*  0.3*    Recent Labs  07/04/15 12/24/15  WBC 7.2  7.2 4.9  HGB 12.7  12.7 11.1*  HCT 40  40 33*  PLT 389  389 275    Recent Labs  07/04/15 12/24/15  CHOL 161  161 140  LDLCALC 74  74 82  TRIG 276*  276* 106   Lab Results  Component Value Date   MICROALBUR 1.9 08/15/2015   Lab Results  Component Value Date   TSH 4.29 12/25/2015     Lab Results  Component Value Date   HGBA1C 6.8 12/24/2015   Lab Results  Component Value Date   CHOL 140 12/24/2015   HDL 37 12/24/2015   LDLCALC 82 12/24/2015   TRIG 106 12/24/2015    Significant Diagnostic Results in last 30 days:  No results found.  Assessment and Plan  Dysphagia, oral phase No reports of as[pirations or PNA; plan to cont soft mechanical pureed diet  Dementia with behavioral disturbance Continues slow downward trajectory but no major declines;plan to  cont aricept 10mg  qHS and namenda 10 mg bid                                                                                   Depression Stable on reduced dose remeron 7.5 mg qHS;plan to cont current dose   Labs/tests ordered:    Webb Silversmith D. Sheppard Coil, MD

## 2016-02-20 ENCOUNTER — Encounter: Payer: Self-pay | Admitting: Internal Medicine

## 2016-02-20 DIAGNOSIS — C50919 Malignant neoplasm of unspecified site of unspecified female breast: Secondary | ICD-10-CM | POA: Insufficient documentation

## 2016-03-02 ENCOUNTER — Encounter: Payer: Self-pay | Admitting: Internal Medicine

## 2016-03-02 NOTE — Assessment & Plan Note (Addendum)
Continues slow downward trajectory but no major declines;plan to cont aricept 10mg  qHS and namenda 10 mg bid

## 2016-03-02 NOTE — Assessment & Plan Note (Signed)
Stable on reduced dose remeron 7.5 mg qHS;plan to cont current dose

## 2016-03-02 NOTE — Assessment & Plan Note (Signed)
No reports of as[pirations or PNA; plan to cont soft mechanical pureed diet

## 2016-03-26 ENCOUNTER — Non-Acute Institutional Stay (SKILLED_NURSING_FACILITY): Payer: Medicare Other | Admitting: Internal Medicine

## 2016-03-26 ENCOUNTER — Encounter: Payer: Self-pay | Admitting: Internal Medicine

## 2016-03-26 DIAGNOSIS — F29 Unspecified psychosis not due to a substance or known physiological condition: Secondary | ICD-10-CM | POA: Diagnosis not present

## 2016-03-26 DIAGNOSIS — N182 Chronic kidney disease, stage 2 (mild): Secondary | ICD-10-CM | POA: Diagnosis not present

## 2016-03-26 DIAGNOSIS — M810 Age-related osteoporosis without current pathological fracture: Secondary | ICD-10-CM | POA: Diagnosis not present

## 2016-03-26 NOTE — Progress Notes (Signed)
Location:  [place] Room Number: [phone] Place of Service:  SNF [phone])  Jill Francis. Jill Coil, MD  No care team member to display  Extended Emergency Contact Information Primary Emergency Contact: [name] [place] of [place] Phone: [phone] Work Phone: [phone] Mobile Phone: [phone] Relation: Niece Secondary Emergency Contact: [place] of [place] Phone: [phone] Relation: None    Allergies: Gabapentin; Iodine; and Sulfonamide derivatives  Chief Complaint  Patient presents with  . Medical Management of Chronic Issues    Routine Visit    HPI: Patient is 83 y.o. female who is being seen for routine issues of psychosis, ostoporosis and CKD2.  Past Medical History:  Diagnosis Date  . Alzheimer's disease 08/19/2012  . Asthma   . Breast cancer ([place])   . Cancer [facility])    Skin cancer  . Depression   . Diabetes mellitus   . Hyperlipidemia   . Ovarian cancer ([place])   . Type II or unspecified type diabetes mellitus without mention of complication, uncontrolled 08/19/2012    Past Surgical History:  Procedure Laterality Date  . BACK SURGERY    . BREAST SURGERY  right partial mastectomy  06/2003  . KNEE SURGERY    . SKIN CANCER EXCISION      Allergies as of 03/26/2016      Reactions   Gabapentin    Per mar   Iodine    Per mar   Sulfonamide Derivatives    Per mar      Medication List       Accurate as of 03/26/16 11:59 PM. Always use your most recent med list.          calcium-vitamin D 500-200 MG-UNIT tablet Commonly known as:  OSCAL WITH D Take 1 tablet by mouth 3 (three) times daily with meals.   donepezil 10 MG tablet Commonly known as:  ARICEPT Take 10 mg by mouth at bedtime. For dementia   ferrous sulfate 220 (44 Fe) MG/5ML solution Take 220 mg by mouth daily.   HUMALOG KWIKPEN 100 UNIT/ML KiwkPen Generic drug:  insulin lispro Inject 5 Units into the skin 2 (two) times  daily. With lunch and dinner   memantine 10 MG tablet Commonly known as:  NAMENDA Take 10 mg by mouth 2 (two) times daily. For dementia   mirtazapine 15 MG tablet Commonly known as:  REMERON Take 7.5 mg by mouth at bedtime. 1/2 tablet   sitaGLIPtin 100 MG tablet Commonly known as:  JANUVIA Take 100 mg by mouth daily.   TOUJEO SOLOSTAR 300 UNIT/ML Sopn Generic drug:  Insulin Glargine Inject 10 Units into the skin at bedtime.   UNABLE TO FIND Med Name: Med Pass  Give 90 ml nsa with medication three times a day       No orders of the defined types were placed in this encounter.   Immunization History  Administered Date(s) Administered  . Influenza Whole 12/27/2012  . Influenza-Unspecified 12/27/2013, 01/03/2016  . PPD Test 06/05/2009  . Pneumococcal Polysaccharide-23 01/17/2013    Social History  Substance Use Topics  . Smoking status: Former Research scientist (life sciences)  . Smokeless tobacco: Not on file  . Alcohol use No    Review of Systems  UTO 2/2 dementia    Vitals:   03/26/16 0946  BP: 116/72  Pulse: 69  Resp: 18  Temp: 97.1 F (36.2 C)   Body mass index is 29.04 kg/m. Physical Exam  GENERAL APPEARANCE: Alert, min conversant,  No acute distress  SKIN: No diaphoresis rash HEENT: Unremarkable RESPIRATORY: Breathing is even, unlabored. Lung sounds are clear   CARDIOVASCULAR: Heart RRR no murmurs, rubs or gallops. No peripheral edema  GASTROINTESTINAL: Abdomen is soft, non-tender, not distended w/ normal bowel sounds.  GENITOURINARY: Bladder non tender, not distended  MUSCULOSKELETAL: No abnormal joints or musculature NEUROLOGIC: Cranial nerves 2-12 grossly intact. Moves all extremities PSYCHIATRIC: dementia, no behavioral issues  Patient Active Problem List   Diagnosis Date Noted  . Breast cancer ([place])   . Osteoporosis 06/23/2015  . Bradycardia 09/18/2014  . Prolonged QT interval 09/18/2014  . Hypocalcemia 05/27/2014  . FTT (failure to thrive) in adult 05/27/2014    . Dysphagia, oral phase 04/26/2014  . CKD (chronic kidney disease) stage 2, GFR 60-89 ml/min 04/26/2014  . Type 2 diabetes mellitus without complication ([place]) Q000111Q  . Hyperlipidemia   . Chronic renal disease, stage 2, mildly decreased glomerular filtration rate between 60-89 mL/min/1.73 square meter 10/14/2013  . Cough 06/29/2013  . Hypoglycemia 01/16/2013  . Psychosis 09/20/2012  . Diabetes mellitus type 2, uncontrolled, without complications ([place]) Q000111Q  . Dementia with behavioral disturbance 08/19/2012  . Iron deficiency anemia 08/19/2012  . Depression 08/19/2012  . Malignant neoplasm of female breast ([place]) 06/30/2008  . ADENOCARCINOMA, OVARY 06/29/2008  . Anxiety state 01/16/2007    CMP     Component Value Date/Time   NA 144 12/24/2015   K 4.0 12/24/2015   CL 108 09/11/2014 0405   CO2 31 09/11/2014 0405   GLUCOSE 95 09/11/2014 0405   BUN 20 12/24/2015   CREATININE 0.9 12/24/2015   CREATININE 0.84 09/11/2014 0405   CALCIUM 7.3 (L) 09/11/2014 0405   PROT 5.4 (L) 09/10/2014 0300   ALBUMIN 2.3 (L) 09/10/2014 0300   AST 9 (A) 12/24/2015   ALT 4 (A) 12/24/2015   ALKPHOS 0.3 (A) 12/24/2015   ALKPHOS 0 (A) 12/24/2015   ALKPHOS 47 12/24/2015   BILITOT 0.2 (L) 09/10/2014 0300   GFRNONAA >60 09/11/2014 0405   GFRAA >60 09/11/2014 0405    Recent Labs  07/04/15 12/24/15  NA 144  144 144  K 4.3  4.3 4.0  BUN 18  18 20   CREATININE 0.8  0.8 0.9    Recent Labs  07/04/15 12/24/15  AST 9*  9* 9*  ALT 5*  5* 4*  ALKPHOS 67  67 47  0*  0.3*    Recent Labs  07/04/15 12/24/15  WBC 7.2  7.2 4.9  HGB 12.7  12.7 11.1*  HCT 40  40 33*  PLT 389  389 275    Recent Labs  07/04/15 12/24/15  CHOL 161  161 140  LDLCALC 74  74 82  TRIG 276*  276* 106   Lab Results  Component Value Date   MICROALBUR 1.9 08/15/2015   Lab Results  Component Value Date   TSH 4.29 12/25/2015   Lab Results  Component Value Date   HGBA1C 6.8 12/24/2015   Lab  Results  Component Value Date   CHOL 140 12/24/2015   HDL 37 12/24/2015   LDLCALC 82 12/24/2015   TRIG 106 12/24/2015    Significant Diagnostic Results in last 30 days:  No results found.  Assessment and Plan  Psychosis Hx psychosis in ppast but currently exhibits stable mood; will cont to monitor  Osteoporosis No breaks; plan to cont oscal +D 500-200, 1 po daily  Chronic renal disease, stage 2, mildly decreased glomerular filtration rate between 60-89 mL/min/1.73 square meter  BUN 20/Cr 0.9 stable from prior;no recent GFR; plan to monitor at intervals    Webb Silversmith D. Jill Coil, MD

## 2016-03-30 ENCOUNTER — Encounter: Payer: Self-pay | Admitting: Internal Medicine

## 2016-03-30 NOTE — Assessment & Plan Note (Signed)
No breaks; plan to cont oscal +D 500-200, 1 po daily

## 2016-03-30 NOTE — Assessment & Plan Note (Signed)
Hx psychosis in ppast but currently exhibits stable mood; will cont to monitor

## 2016-03-30 NOTE — Assessment & Plan Note (Signed)
BUN 20/Cr 0.9 stable from prior;no recent GFR; plan to monitor at intervals

## 2016-04-17 ENCOUNTER — Non-Acute Institutional Stay (SKILLED_NURSING_FACILITY): Payer: Medicare Other | Admitting: Internal Medicine

## 2016-04-17 DIAGNOSIS — Z20828 Contact with and (suspected) exposure to other viral communicable diseases: Secondary | ICD-10-CM | POA: Diagnosis not present

## 2016-04-24 ENCOUNTER — Non-Acute Institutional Stay (SKILLED_NURSING_FACILITY): Payer: Medicare Other | Admitting: Internal Medicine

## 2016-04-24 ENCOUNTER — Encounter: Payer: Self-pay | Admitting: Internal Medicine

## 2016-04-24 DIAGNOSIS — R627 Adult failure to thrive: Secondary | ICD-10-CM

## 2016-04-24 DIAGNOSIS — F329 Major depressive disorder, single episode, unspecified: Secondary | ICD-10-CM

## 2016-04-24 DIAGNOSIS — G301 Alzheimer's disease with late onset: Secondary | ICD-10-CM

## 2016-04-24 DIAGNOSIS — F32A Depression, unspecified: Secondary | ICD-10-CM

## 2016-04-24 DIAGNOSIS — F0281 Dementia in other diseases classified elsewhere with behavioral disturbance: Secondary | ICD-10-CM | POA: Diagnosis not present

## 2016-04-24 NOTE — Progress Notes (Signed)
Location:  [place] Room Number: [phone] Place of Service:  SNF [phone])  Noah Delaine. Sheppard Coil, MD  No care team member to display  Extended Emergency Contact Information Primary Emergency Contact: [name] [place] of [place] Phone: [phone] Work Phone: [phone] Mobile Phone: [phone] Relation: Niece Secondary Emergency Contact: [place] of [place] Phone: [phone] Relation: None    Allergies: Gabapentin; Iodine; and Sulfonamide derivatives  Chief Complaint  Patient presents with  . Medical Management of Chronic Issues    Routine Visit    HPI: Patient is 83 y.o. female who is being seen for routine issues of dementia, depression and FTT.  Past Medical History:  Diagnosis Date  . Alzheimer's disease 08/19/2012  . Asthma   . Breast cancer ([place])   . Cancer [facility])    Skin cancer  . Depression   . Diabetes mellitus   . Hyperlipidemia   . Ovarian cancer ([place])   . Type II or unspecified type diabetes mellitus without mention of complication, uncontrolled 08/19/2012    Past Surgical History:  Procedure Laterality Date  . BACK SURGERY    . BREAST SURGERY  right partial mastectomy  06/2003  . KNEE SURGERY    . SKIN CANCER EXCISION      Allergies as of 04/24/2016      Reactions   Gabapentin    Per mar   Iodine    Per mar   Sulfonamide Derivatives    Per mar      Medication List       Accurate as of 04/24/16 11:59 PM. Always use your most recent med list.          calcium-vitamin D 500-200 MG-UNIT tablet Commonly known as:  OSCAL WITH D Take 1 tablet by mouth 3 (three) times daily with meals.   donepezil 10 MG tablet Commonly known as:  ARICEPT Take 10 mg by mouth at bedtime. For dementia   ferrous sulfate 220 (44 Fe) MG/5ML solution Take 220 mg by mouth daily.   memantine 10 MG tablet Commonly known as:  NAMENDA Take 10 mg by mouth 2 (two) times daily. For dementia     mirtazapine 15 MG tablet Commonly known as:  REMERON Take 7.5 mg by mouth at bedtime. 1/2 tablet   oseltamivir 30 MG capsule Commonly known as:  TAMIFLU Take 30 mg by mouth daily.   sitaGLIPtin 100 MG tablet Commonly known as:  JANUVIA Take 100 mg by mouth daily.   TOUJEO SOLOSTAR 300 UNIT/ML Sopn Generic drug:  Insulin Glargine Inject 10 Units into the skin at bedtime.   UNABLE TO FIND Med Name: Med Pass  Give 90 ml nsa with medication three times a day       Meds ordered this encounter  Medications  . oseltamivir (TAMIFLU) 30 MG capsule    Sig: Take 30 mg by mouth daily.    Immunization History  Administered Date(s) Administered  . Influenza Whole 12/27/2012  . Influenza-Unspecified 12/27/2013, 01/03/2016  . PPD Test 06/05/2009  . Pneumococcal Polysaccharide-23 01/17/2013  . Pneumococcal-Unspecified 02/16/2016    Social History  Substance Use Topics  . Smoking status: Former Research scientist (life sciences)  . Smokeless tobacco: Never Used  . Alcohol use No    Review of Systems  UTO 2/2 dementia     Vitals:   04/24/16 1229  BP: 116/72  Pulse: (!) 52  Resp: 16  Temp: 97.1 F (36.2 C)   Body mass index is  28.87 kg/m. Physical Exam  GENERAL APPEARANCE: Alert, conversant, No acute distress  SKIN: No diaphoresis rash HEENT: Unremarkable RESPIRATORY: Breathing is even, unlabored. Lung sounds are clear   CARDIOVASCULAR: Heart RRR no murmurs, rubs or gallops. No peripheral edema  GASTROINTESTINAL: Abdomen is soft, non-tender, not distended w/ normal bowel sounds.  GENITOURINARY: Bladder non tender, not distended  MUSCULOSKELETAL: No abnormal joints or musculature NEUROLOGIC: Cranial nerves 2-12 grossly intact. Moves all extremities PSYCHIATRIC: dementia, no behavioral issues  Patient Active Problem List   Diagnosis Date Noted  . Breast cancer ([place])   . Osteoporosis 06/23/2015  . Bradycardia 09/18/2014  . Prolonged QT interval 09/18/2014  . Hypocalcemia 05/27/2014  .  FTT (failure to thrive) in adult 05/27/2014  . Dysphagia, oral phase 04/26/2014  . CKD (chronic kidney disease) stage 2, GFR 60-89 ml/min 04/26/2014  . Type 2 diabetes mellitus without complication ([place]) Q000111Q  . Hyperlipidemia   . Chronic renal disease, stage 2, mildly decreased glomerular filtration rate between 60-89 mL/min/1.73 square meter 10/14/2013  . Cough 06/29/2013  . Hypoglycemia 01/16/2013  . Psychosis 09/20/2012  . Diabetes mellitus type 2, uncontrolled, without complications ([place]) Q000111Q  . Dementia with behavioral disturbance 08/19/2012  . Iron deficiency anemia 08/19/2012  . Depression 08/19/2012  . Malignant neoplasm of female breast ([place]) 06/30/2008  . ADENOCARCINOMA, OVARY 06/29/2008  . Anxiety state 01/16/2007    CMP     Component Value Date/Time   NA 144 12/24/2015   K 4.0 12/24/2015   CL 108 09/11/2014 0405   CO2 31 09/11/2014 0405   GLUCOSE 95 09/11/2014 0405   BUN 20 12/24/2015   CREATININE 0.9 12/24/2015   CREATININE 0.84 09/11/2014 0405   CALCIUM 7.3 (L) 09/11/2014 0405   PROT 5.4 (L) 09/10/2014 0300   ALBUMIN 2.3 (L) 09/10/2014 0300   AST 9 (A) 12/24/2015   ALT 4 (A) 12/24/2015   ALKPHOS 0.3 (A) 12/24/2015   ALKPHOS 0 (A) 12/24/2015   ALKPHOS 47 12/24/2015   BILITOT 0.2 (L) 09/10/2014 0300   GFRNONAA >60 09/11/2014 0405   GFRAA >60 09/11/2014 0405    Recent Labs  07/04/15 12/24/15  NA 144  144 144  K 4.3  4.3 4.0  BUN 18  18 20   CREATININE 0.8  0.8 0.9    Recent Labs  07/04/15 12/24/15  AST 9*  9* 9*  ALT 5*  5* 4*  ALKPHOS 67  67 47  0*  0.3*    Recent Labs  07/04/15 12/24/15  WBC 7.2  7.2 4.9  HGB 12.7  12.7 11.1*  HCT 40  40 33*  PLT 389  389 275    Recent Labs  07/04/15 12/24/15  CHOL 161  161 140  LDLCALC 74  74 82  TRIG 276*  276* 106   Lab Results  Component Value Date   MICROALBUR 1.9 08/15/2015   Lab Results  Component Value Date   TSH 4.29 12/25/2015   Lab Results  Component  Value Date   HGBA1C 6.8 12/24/2015   Lab Results  Component Value Date   CHOL 140 12/24/2015   HDL 37 12/24/2015   LDLCALC 82 12/24/2015   TRIG 106 12/24/2015    Significant Diagnostic Results in last 30 days:  No results found.  Assessment and Plan  Dementia with behavioral disturbance No major declines but continues downward ; cont namenda 10 mg BID and aricept 10 mg daily  Depression Chronic and stable; cont remeron 7.5 mg qHS  FTT (failure to  thrive) in adult Present, but slow progression; cont supportive care; cont remeron 7.5 mg qHs for depression and appetitie     Webb Silversmith D. Sheppard Coil, MD

## 2016-05-04 ENCOUNTER — Encounter: Payer: Self-pay | Admitting: Internal Medicine

## 2016-05-04 NOTE — Assessment & Plan Note (Signed)
No major declines but continues downward ; cont namenda 10 mg BID and aricept 10 mg daily

## 2016-05-04 NOTE — Assessment & Plan Note (Addendum)
Present, but slow progression; cont supportive care; cont remeron 7.5 mg qHs for depression and appetitie

## 2016-05-04 NOTE — Assessment & Plan Note (Addendum)
Chronic and stable; cont remeron 7.5 mg qHS

## 2016-05-13 LAB — HM DIABETES EYE EXAM

## 2016-05-21 ENCOUNTER — Non-Acute Institutional Stay (SKILLED_NURSING_FACILITY): Payer: Medicare Other | Admitting: Internal Medicine

## 2016-05-21 ENCOUNTER — Encounter: Payer: Self-pay | Admitting: Internal Medicine

## 2016-05-21 DIAGNOSIS — F29 Unspecified psychosis not due to a substance or known physiological condition: Secondary | ICD-10-CM

## 2016-05-21 DIAGNOSIS — M810 Age-related osteoporosis without current pathological fracture: Secondary | ICD-10-CM | POA: Diagnosis not present

## 2016-05-21 DIAGNOSIS — N182 Chronic kidney disease, stage 2 (mild): Secondary | ICD-10-CM

## 2016-05-21 NOTE — Progress Notes (Signed)
Location:  [place] Room Number: [phone] Place of Service:  SNF [phone]) Noah Delaine. Sheppard Coil, MD   Patient Care Team: Hennie Duos, MD as PCP - General (Internal Medicine)  Extended Emergency Contact Information Primary Emergency Contact: [name] [place] of [place] Phone: [phone] Work Phone: [phone] Mobile Phone: [phone] Relation: Niece Secondary Emergency Contact: [place]  [place] Phone: [phone] Relation: None    Allergies: Gabapentin; Iodine; and Sulfonamide derivatives  Chief Complaint  Patient presents with  . Medical Management of Chronic Issues    Routine Visit    HPI: Patient is 83 y.o. female who is being seen for routine issues of psychosis, osteoporosis and CKD2.  Past Medical History:  Diagnosis Date  . Alzheimer's disease 08/19/2012  . Asthma   . Breast cancer ([place])   . Cancer [facility])    Skin cancer  . Depression   . Diabetes mellitus   . Hyperlipidemia   . Ovarian cancer ([place])   . Type II or unspecified type diabetes mellitus without mention of complication, uncontrolled 08/19/2012    Past Surgical History:  Procedure Laterality Date  . BACK SURGERY    . BREAST SURGERY  right partial mastectomy  06/2003  . KNEE SURGERY    . SKIN CANCER EXCISION      Allergies as of 05/21/2016      Reactions   Gabapentin    Per mar   Iodine    Per mar   Sulfonamide Derivatives    Per mar      Medication List       Accurate as of 05/21/16 11:59 PM. Always use your most recent med list.          calcium-vitamin D 500-200 MG-UNIT tablet Commonly known as:  OSCAL WITH D Take 1 tablet by mouth 3 (three) times daily with meals.   donepezil 10 MG tablet Commonly known as:  ARICEPT Take 10 mg by mouth at bedtime. For dementia   ferrous sulfate 220 (44 Fe) MG/5ML solution Take 220 mg by mouth daily.   memantine 10 MG tablet Commonly known as:  NAMENDA Take 10 mg  by mouth 2 (two) times daily. For dementia   mirtazapine 15 MG tablet Commonly known as:  REMERON Take 7.5 mg by mouth at bedtime. 1/2 tablet   sitaGLIPtin 100 MG tablet Commonly known as:  JANUVIA Take 100 mg by mouth daily.   TOUJEO SOLOSTAR 300 UNIT/ML Sopn Generic drug:  Insulin Glargine Inject 10 Units into the skin at bedtime.   UNABLE TO FIND Med Name: Med Pass  Give 90 ml nsa with medication three times a day       No orders of the defined types were placed in this encounter.   Immunization History  Administered Date(s) Administered  . Influenza Whole 12/27/2012  . Influenza-Unspecified 12/27/2013, 01/03/2016  . PPD Test 06/05/2009  . Pneumococcal Polysaccharide-23 01/17/2013  . Pneumococcal-Unspecified 02/16/2016    Social History  Substance Use Topics  . Smoking status: Former Research scientist (life sciences)  . Smokeless tobacco: Never Used  . Alcohol use No    Review of Systems  UTO 2/2 dementia   Vitals:   05/21/16 1002  BP: 116/72  Pulse: 71  Resp: 16  Temp: 97.1 F (36.2 C)   Body mass index is 28.48 kg/m. Physical Exam  GENERAL APPEARANCE: Alert, mod conversant, No acute distress  SKIN: No diaphoresis rash HEENT: Unremarkable RESPIRATORY: Breathing is even, unlabored. Lung sounds  are clear   CARDIOVASCULAR: Heart RRR no murmurs, rubs or gallops. No peripheral edema  GASTROINTESTINAL: Abdomen is soft, non-tender, not distended w/ normal bowel sounds.  GENITOURINARY: Bladder non tender, not distended  MUSCULOSKELETAL: No abnormal joints or musculature NEUROLOGIC: Cranial nerves 2-12 grossly intact. Moves all extremities PSYCHIATRIC: dementia, no behavioral issues  Patient Active Problem List   Diagnosis Date Noted  . Breast cancer ([place])   . Osteoporosis 06/23/2015  . Bradycardia 09/18/2014  . Prolonged QT interval 09/18/2014  . Hypocalcemia 05/27/2014  . FTT (failure to thrive) in adult 05/27/2014  . Dysphagia, oral phase 04/26/2014  . CKD (chronic kidney  disease) stage 2, GFR 60-89 ml/min 04/26/2014  . Type 2 diabetes mellitus without complication ([place]) 74/02/8785  . Hyperlipidemia   . Chronic renal disease, stage 2, mildly decreased glomerular filtration rate between 60-89 mL/min/1.73 square meter 10/14/2013  . Cough 06/29/2013  . Hypoglycemia 01/16/2013  . Psychosis 09/20/2012  . Diabetes mellitus type 2, uncontrolled, without complications ([place]) 76/72/0947  . Dementia with behavioral disturbance 08/19/2012  . Iron deficiency anemia 08/19/2012  . Depression 08/19/2012  . Malignant neoplasm of female breast ([place]) 06/30/2008  . ADENOCARCINOMA, OVARY 06/29/2008  . Anxiety state 01/16/2007    CMP     Component Value Date/Time   NA 144 12/24/2015   K 4.0 12/24/2015   CL 108 09/11/2014 0405   CO2 31 09/11/2014 0405   GLUCOSE 95 09/11/2014 0405   BUN 20 12/24/2015   CREATININE 0.9 12/24/2015   CREATININE 0.84 09/11/2014 0405   CALCIUM 7.3 (L) 09/11/2014 0405   PROT 5.4 (L) 09/10/2014 0300   ALBUMIN 2.3 (L) 09/10/2014 0300   AST 9 (A) 12/24/2015   ALT 4 (A) 12/24/2015   ALKPHOS 0.3 (A) 12/24/2015   ALKPHOS 0 (A) 12/24/2015   ALKPHOS 47 12/24/2015   BILITOT 0.2 (L) 09/10/2014 0300   GFRNONAA >60 09/11/2014 0405   GFRAA >60 09/11/2014 0405    Recent Labs  07/04/15 12/24/15  NA 144  144 144  K 4.3  4.3 4.0  BUN 18  18 20   CREATININE 0.8  0.8 0.9    Recent Labs  07/04/15 12/24/15  AST 9*  9* 9*  ALT 5*  5* 4*  ALKPHOS 67  67 47  0*  0.3*    Recent Labs  07/04/15 12/24/15  WBC 7.2  7.2 4.9  HGB 12.7  12.7 11.1*  HCT 40  40 33*  PLT 389  389 275    Recent Labs  07/04/15 12/24/15  CHOL 161  161 140  LDLCALC 74  74 82  TRIG 276*  276* 106   Lab Results  Component Value Date   MICROALBUR 1.9 08/15/2015   Lab Results  Component Value Date   TSH 4.29 12/25/2015   Lab Results  Component Value Date   HGBA1C 6.8 12/24/2015   Lab Results  Component Value Date   CHOL 140 12/24/2015   HDL  37 12/24/2015   LDLCALC 82 12/24/2015   TRIG 106 12/24/2015    Significant Diagnostic Results in last 30 days:  No results found.  Assessment and Plan  Psychosis Mood has appeared stable on no meds; will cont to monitor  Osteoporosis No fractures;plan to cont oscal + D 500-200 1 po daily  Chronic renal disease, stage 2, mildly decreased glomerular filtration rate between 60-89 mL/min/1.73 square meter No recent GFR, BUN/Cr are stable; pt is due for labs, have ordered.    Noah Delaine. Sheppard Coil, MD

## 2016-05-28 ENCOUNTER — Encounter: Payer: Self-pay | Admitting: Internal Medicine

## 2016-05-28 NOTE — Progress Notes (Signed)
Location:  [place] Room Number: [phone] Place of Service:  SNF [id number])  Jill Francis. Sheppard Coil, MD  No care team member to display  Extended Emergency Contact Information Primary Emergency Contact: Jill Francis States of [place] Phone: [phone] Work Phone: [phone] Mobile Phone: [phone] Relation: Niece Secondary Emergency Contact: Jill Francis Phone: [phone] Relation: None    Allergies: Gabapentin; Iodine; and Sulfonamide derivatives  Chief Complaint  Patient presents with  . Acute Visit    HPI: Patient is 83 y.o. female who is being seen acutely because an outbreak of Influenza A per CDC guidelines was recognized on 04/16/2016. Pt has no c/o flu like symptoms;therefore pt will need to be prophylaxed with Tamiflu for a minimum of 14 days per CDC protocol.  Past Medical History:  Diagnosis Date  . Alzheimer's disease 08/19/2012  . Asthma   . Breast cancer ([place])   . Cancer [facility])    Skin cancer  . Depression   . Diabetes mellitus   . Hyperlipidemia   . Ovarian cancer ([place])   . Type II or unspecified type diabetes mellitus without mention of complication, uncontrolled 08/19/2012    Past Surgical History:  Procedure Laterality Date  . BACK SURGERY    . BREAST SURGERY  right partial mastectomy  06/2003  . KNEE SURGERY    . SKIN CANCER EXCISION      Allergies as of 04/17/2016      Reactions   Gabapentin    Per mar   Iodine    Per mar   Sulfonamide Derivatives    Per mar      Medication List       Accurate as of 04/17/16 11:59 PM. Always use your most recent med list.          calcium-vitamin D 500-200 MG-UNIT tablet Commonly known as:  OSCAL WITH D Take 1 tablet by mouth 3 (three) times daily with meals.   donepezil 10 MG tablet Commonly known as:  ARICEPT Take 10 mg by mouth at bedtime. For dementia   ferrous sulfate 220 (44 Fe) MG/5ML solution Take 220 mg by mouth  daily.   memantine 10 MG tablet Commonly known as:  NAMENDA Take 10 mg by mouth 2 (two) times daily. For dementia   mirtazapine 15 MG tablet Commonly known as:  REMERON Take 7.5 mg by mouth at bedtime. 1/2 tablet   sitaGLIPtin 100 MG tablet Commonly known as:  JANUVIA Take 100 mg by mouth daily.   TOUJEO SOLOSTAR 300 UNIT/ML Sopn Generic drug:  Insulin Glargine Inject 10 Units into the skin at bedtime.   UNABLE TO FIND Med Name: Med Pass  Give 90 ml nsa with medication three times a day       No orders of the defined types were placed in this encounter.   Immunization History  Administered Date(s) Administered  . Influenza Whole 12/27/2012  . Influenza-Unspecified 12/27/2013, 01/03/2016  . PPD Test 06/05/2009  . Pneumococcal Polysaccharide-23 01/17/2013  . Pneumococcal-Unspecified 02/16/2016    Social History  Substance Use Topics  . Smoking status: Former Research scientist (life sciences)  . Smokeless tobacco: Never Used  . Alcohol use No    Review of Systems  DATA OBTAINED: from patient, nurse GENERAL:  no fevers SKIN: No itching, rash HEENT: no rhinorrhea, congestion, ST or ear pain RESPIRATORY: No cough, wheezing, SOB CARDIAC: No chest pain, palpitations, lower extremity edema  GI: No abdominal pain, No N/V/D or constipation,  No heartburn or reflux  MUSCULOSKELETAL: No muscle aches NEUROLOGIC: No headache, dizziness   Vitals:   04/17/16 1506  BP: 116/72  Pulse: 71  Resp: 16  Temp: 97.1 F (36.2 C)   Body mass index is 28.48 kg/m. Physical Exam  GENERAL APPEARANCE: Alert, No acute distress  SKIN: No diaphoresis rash HEENT: Unremarkable RESPIRATORY: Breathing is even, unlabored. Lung sounds are clear   CARDIOVASCULAR: Heart RRR no murmurs, rubs or gallops. No peripheral edema  GASTROINTESTINAL: Abdomen is soft, non-tender, not distended w/ normal bowel sounds.   NEUROLOGIC: Cranial nerves 2-12 grossly intact PSYCHIATRIC: baseline, no mental status changes  Patient  Active Problem List   Diagnosis Date Noted  . Breast cancer ([place])   . Osteoporosis 06/23/2015  . Bradycardia 09/18/2014  . Prolonged QT interval 09/18/2014  . Hypocalcemia 05/27/2014  . FTT (failure to thrive) in adult 05/27/2014  . Dysphagia, oral phase 04/26/2014  . CKD (chronic kidney disease) stage 2, GFR 60-89 ml/min 04/26/2014  . Type 2 diabetes mellitus without complication ([name]) 67/61/9509  . Hyperlipidemia   . Chronic renal disease, stage 2, mildly decreased glomerular filtration rate between 60-89 mL/min/1.73 square meter 10/14/2013  . Cough 06/29/2013  . Hypoglycemia 01/16/2013  . Psychosis 09/20/2012  . Diabetes mellitus type 2, uncontrolled, without complications ([name]) 32/67/1245  . Dementia with behavioral disturbance 08/19/2012  . Iron deficiency anemia 08/19/2012  . Depression 08/19/2012  . Malignant neoplasm of female breast ([name]) 06/30/2008  . ADENOCARCINOMA, OVARY 06/29/2008  . Anxiety state 01/16/2007    CMP     Component Value Date/Time   NA 144 12/24/2015   K 4.0 12/24/2015   CL 108 09/11/2014 0405   CO2 31 09/11/2014 0405   GLUCOSE 95 09/11/2014 0405   BUN 20 12/24/2015   CREATININE 0.9 12/24/2015   CREATININE 0.84 09/11/2014 0405   CALCIUM 7.3 (L) 09/11/2014 0405   PROT 5.4 (L) 09/10/2014 0300   ALBUMIN 2.3 (L) 09/10/2014 0300   AST 9 (A) 12/24/2015   ALT 4 (A) 12/24/2015   ALKPHOS 0.3 (A) 12/24/2015   ALKPHOS 0 (A) 12/24/2015   ALKPHOS 47 12/24/2015   BILITOT 0.2 (L) 09/10/2014 0300   GFRNONAA >60 09/11/2014 0405   GFRAA >60 09/11/2014 0405    Recent Labs  07/04/15 12/24/15  NA 144  144 144  K 4.3  4.3 4.0  BUN 18  18 20   CREATININE 0.8  0.8 0.9    Recent Labs  07/04/15 12/24/15  AST 9*  9* 9*  ALT 5*  5* 4*  ALKPHOS 67  67 47  0*  0.3*    Recent Labs  07/04/15 12/24/15  WBC 7.2  7.2 4.9  HGB 12.7  12.7 11.1*  HCT 40  40 33*  PLT 389  389 275    Recent Labs  07/04/15 12/24/15  CHOL 161  161 140  LDLCALC  74  74 82  TRIG 276*  276* 106   Lab Results  Component Value Date   MICROALBUR 1.9 08/15/2015   Lab Results  Component Value Date   TSH 4.29 12/25/2015   Lab Results  Component Value Date   HGBA1C 6.8 12/24/2015   Lab Results  Component Value Date   CHOL 140 12/24/2015   HDL 37 12/24/2015   LDLCALC 82 12/24/2015   TRIG 106 12/24/2015    Significant Diagnostic Results in last 30 days:  No results found.  Assessment and Plan  EXPOSURE TO FLU/ INFLUENZA OUTBREAK AT SNF-  CrCl calculated by me-    48    Dose for 14 days-  30 mg daily Pt will be monitored daily for flu like symptoms                                                                                 Webb Silversmith D. Sheppard Coil, MD

## 2016-06-04 LAB — HM DIABETES FOOT EXAM

## 2016-06-08 ENCOUNTER — Encounter: Payer: Self-pay | Admitting: Internal Medicine

## 2016-06-13 NOTE — Assessment & Plan Note (Signed)
Mood has appeared stable on no meds; will cont to monitor

## 2016-06-13 NOTE — Assessment & Plan Note (Signed)
No fractures;plan to cont oscal + D 500-200 1 po daily

## 2016-06-13 NOTE — Assessment & Plan Note (Signed)
No recent GFR, BUN/Cr are stable; pt is due for labs, have ordered.

## 2016-06-16 LAB — BASIC METABOLIC PANEL
BUN: 20 mg/dL (ref 4–21)
Creatinine: 0.8 mg/dL (ref 0.5–1.1)
Glucose: 101 mg/dL
Potassium: 4.5 mmol/L (ref 3.4–5.3)
Sodium: 147 mmol/L (ref 137–147)

## 2016-06-16 LAB — HEPATIC FUNCTION PANEL
ALK PHOS: 61 U/L (ref 25–125)
AST: 9 U/L — AB (ref 13–35)

## 2016-06-16 LAB — LIPID PANEL
CHOLESTEROL: 153 mg/dL (ref 0–200)
HDL: 38 mg/dL (ref 35–70)
LDL CALC: 102 mg/dL
TRIGLYCERIDES: 67 mg/dL (ref 40–160)

## 2016-06-16 LAB — HEMOGLOBIN A1C: Hemoglobin A1C: 7.1

## 2016-06-16 LAB — CBC AND DIFFERENTIAL
HEMATOCRIT: 41 % (ref 36–46)
HEMOGLOBIN: 13 g/dL (ref 12.0–16.0)
PLATELETS: 244 10*3/uL (ref 150–399)
WBC: 5.1 10*3/mL

## 2016-06-16 LAB — VITAMIN D 25 HYDROXY (VIT D DEFICIENCY, FRACTURES): Vit D, 25-Hydroxy: 18.96

## 2016-06-30 ENCOUNTER — Encounter: Payer: Self-pay | Admitting: Internal Medicine

## 2016-06-30 ENCOUNTER — Non-Acute Institutional Stay (SKILLED_NURSING_FACILITY): Payer: Medicare Other | Admitting: Internal Medicine

## 2016-06-30 DIAGNOSIS — R1311 Dysphagia, oral phase: Secondary | ICD-10-CM

## 2016-06-30 DIAGNOSIS — Z794 Long term (current) use of insulin: Secondary | ICD-10-CM

## 2016-06-30 DIAGNOSIS — D508 Other iron deficiency anemias: Secondary | ICD-10-CM | POA: Diagnosis not present

## 2016-06-30 DIAGNOSIS — E119 Type 2 diabetes mellitus without complications: Secondary | ICD-10-CM | POA: Diagnosis not present

## 2016-06-30 NOTE — Progress Notes (Signed)
Location:  [facility] Room Number: [phone] Place of Service:  SNF (31)  Hennie Duos, MD  Patient Care Team: Hennie Duos, MD as PCP - General (Internal Medicine)  Extended Emergency Contact Information Primary Emergency Contact: [name] [place] of [place] Phone: [phone] Work Phone: [phone] Mobile Phone: [phone] Relation: Niece Secondary Emergency Contact: [place]  [place] Phone: [phone] Relation: None    Allergies: Gabapentin; Iodine; and Sulfonamide derivatives  Chief Complaint  Patient presents with  . Medical Management of Chronic Issues    Routine Visit    HPI: Patient is 83 y.o. female who is being seen for routine issues of dysphagia, , DM2 and iron deficiency anemia.  Past Medical History:  Diagnosis Date  . Alzheimer's disease 08/19/2012  . Asthma   . Breast cancer ([place])   . Cancer [facility])    Skin cancer  . Depression   . Diabetes mellitus   . Hyperlipidemia   . Ovarian cancer ([place])   . Type II or unspecified type diabetes mellitus without mention of complication, uncontrolled 08/19/2012    Past Surgical History:  Procedure Laterality Date  . BACK SURGERY    . BREAST SURGERY  right partial mastectomy  06/2003  . KNEE SURGERY    . SKIN CANCER EXCISION      Allergies as of 06/30/2016      Reactions   Gabapentin    Per mar   Iodine    Per mar   Sulfonamide Derivatives    Per mar      Medication List       Accurate as of 06/30/16 11:59 PM. Always use your most recent med list.          calcium-vitamin D 500-200 MG-UNIT tablet Commonly known as:  OSCAL WITH D Take 1 tablet by mouth 3 (three) times daily with meals.   donepezil 10 MG tablet Commonly known as:  ARICEPT Take 10 mg by mouth at bedtime. For dementia   ferrous sulfate 220 (44 Fe) MG/5ML solution Take 220 mg by mouth daily.   memantine 10 MG tablet Commonly known as:   NAMENDA Take 10 mg by mouth 2 (two) times daily. For dementia   mirtazapine 15 MG tablet Commonly known as:  REMERON Take 7.5 mg by mouth at bedtime. 1/2 tablet   sitaGLIPtin 100 MG tablet Commonly known as:  JANUVIA Take 100 mg by mouth daily.   TOUJEO SOLOSTAR 300 UNIT/ML Sopn Generic drug:  Insulin Glargine Inject 10 Units into the skin at bedtime.   UNABLE TO FIND Med Name: Med Pass  Give 90 ml nsa with medication three times a day       No orders of the defined types were placed in this encounter.   Immunization History  Administered Date(s) Administered  . Influenza Whole 12/27/2012  . Influenza-Unspecified 12/27/2013, 01/03/2016  . PPD Test 06/05/2009  . Pneumococcal Polysaccharide-23 01/17/2013  . Pneumococcal-Unspecified 02/16/2016    Social History  Substance Use Topics  . Smoking status: Former Research scientist (life sciences)  . Smokeless tobacco: Never Used  . Alcohol use No    Review of Systems  UTO 2/2 dementia; nursing-no concerns     Vitals:   06/30/16 0907  BP: 120/61  Pulse: 78  Resp: 16  Temp: 97.8 F (36.6 C)   Body mass index is 27.5 kg/m. Physical Exam  GENERAL APPEARANCE: Alert, conversant, No acute distress  SKIN: No diaphoresis rash HEENT: Unremarkable RESPIRATORY:  Breathing is even, unlabored. Lung sounds are clear   CARDIOVASCULAR: Heart RRR no murmurs, rubs or gallops. No peripheral edema  GASTROINTESTINAL: Abdomen is soft, non-tender, not distended w/ normal bowel sounds.  GENITOURINARY: Bladder non tender, not distended  MUSCULOSKELETAL: No abnormal joints or musculature NEUROLOGIC: Cranial nerves 2-12 grossly intact. Moves all extremities PSYCHIATRIC: dementia, no behavioral issues  Patient Active Problem List   Diagnosis Date Noted  . Breast cancer ([place])   . Osteoporosis 06/23/2015  . Bradycardia 09/18/2014  . Prolonged QT interval 09/18/2014  . Hypocalcemia 05/27/2014  . FTT (failure to thrive) in adult 05/27/2014  . Dysphagia, oral  phase 04/26/2014  . CKD (chronic kidney disease) stage 2, GFR 60-89 ml/min 04/26/2014  . Type 2 diabetes mellitus without complication ([place]) 10/28/4816  . Hyperlipidemia   . Chronic renal disease, stage 2, mildly decreased glomerular filtration rate between 60-89 mL/min/1.73 square meter 10/14/2013  . Cough 06/29/2013  . Hypoglycemia 01/16/2013  . Psychosis 09/20/2012  . Diabetes mellitus type 2, uncontrolled, without complications ([place]) 56/31/4970  . Dementia with behavioral disturbance 08/19/2012  . Iron deficiency anemia 08/19/2012  . Depression 08/19/2012  . Malignant neoplasm of female breast ([place]) 06/30/2008  . ADENOCARCINOMA, OVARY 06/29/2008  . Anxiety state 01/16/2007    CMP     Component Value Date/Time   NA 147 06/16/2016   K 4.5 06/16/2016   CL 108 09/11/2014 0405   CO2 31 09/11/2014 0405   GLUCOSE 95 09/11/2014 0405   BUN 20 06/16/2016   CREATININE 0.8 06/16/2016   CREATININE 0.84 09/11/2014 0405   CALCIUM 7.3 (L) 09/11/2014 0405   PROT 5.4 (L) 09/10/2014 0300   ALBUMIN 2.3 (L) 09/10/2014 0300   AST 9 (A) 06/16/2016   ALT 4 (A) 12/24/2015   ALKPHOS 61 06/16/2016   BILITOT 0.2 (L) 09/10/2014 0300   GFRNONAA >60 09/11/2014 0405   GFRAA >60 09/11/2014 0405    Recent Labs  12/24/15 06/16/16  NA 144 147  K 4.0 4.5  BUN 20 20  CREATININE 0.9 0.8    Recent Labs  12/24/15 06/16/16  AST 9* 9*  ALT 4*  --   ALKPHOS 47  0*  0.3* 61    Recent Labs  12/24/15 06/16/16  WBC 4.9 5.1  HGB 11.1* 13.0  HCT 33* 41  PLT 275 244    Recent Labs  12/24/15 06/16/16  CHOL 140 153  LDLCALC 82 102  TRIG 106 67   Lab Results  Component Value Date   MICROALBUR 1.9 08/15/2015   Lab Results  Component Value Date   TSH 4.29 12/25/2015   Lab Results  Component Value Date   HGBA1C 7.1 06/16/2016   Lab Results  Component Value Date   CHOL 153 06/16/2016   HDL 38 06/16/2016   LDLCALC 102 06/16/2016   TRIG 67 06/16/2016    Significant Diagnostic  Results in last 30 days:  No results found.  Assessment and Plan  Dysphagia, oral phase No reports of aspiration or PNA;plan to cont mechanical soft pureed diet  Type 2 diabetes mellitus without complication In April Y6V was 7.1;plan to cont Januvia 100 mg daily and toujeo 10 u daily  Iron deficiency anemia Most recent Hb 13, excellent; cont iron 220 mg daily    Ripken Rekowski D. Sheppard Coil,  MD

## 2016-07-23 ENCOUNTER — Non-Acute Institutional Stay (SKILLED_NURSING_FACILITY): Payer: Medicare Other | Admitting: Internal Medicine

## 2016-07-23 ENCOUNTER — Encounter: Payer: Self-pay | Admitting: Internal Medicine

## 2016-07-23 DIAGNOSIS — F29 Unspecified psychosis not due to a substance or known physiological condition: Secondary | ICD-10-CM | POA: Diagnosis not present

## 2016-07-23 DIAGNOSIS — F0281 Dementia in other diseases classified elsewhere with behavioral disturbance: Secondary | ICD-10-CM | POA: Diagnosis not present

## 2016-07-23 DIAGNOSIS — R627 Adult failure to thrive: Secondary | ICD-10-CM

## 2016-07-23 DIAGNOSIS — G301 Alzheimer's disease with late onset: Secondary | ICD-10-CM

## 2016-07-23 NOTE — Progress Notes (Signed)
Location:  [facility] [place] Room Number: [phone] Place of Service:  SNF (31)  Hennie Duos, MD  Patient Care Team: Hennie Duos, MD as PCP - General (Internal Medicine)  Extended Emergency Contact Information Primary Emergency Contact: [name] [place] of [place] Phone: [phone] Work Phone: [phone] Mobile Phone: [phone] Relation: Niece Secondary Emergency Contact: [place]  [place] Phone: [phone] Relation: None    Allergies: Gabapentin; Iodine; and Sulfonamide derivatives  Chief Complaint  Patient presents with  . Medical Management of Chronic Issues    Routine Visit    HPI: Patient is 83 y.o. female who is being seen for routine issues of dementia, FTT and psychosis.  Past Medical History:  Diagnosis Date  . Alzheimer's disease 08/19/2012  . Asthma   . Breast cancer ([place])   . Cancer [facility])    Skin cancer  . Depression   . Diabetes mellitus   . Hyperlipidemia   . Ovarian cancer ([place])   . Type II or unspecified type diabetes mellitus without mention of complication, uncontrolled 08/19/2012    Past Surgical History:  Procedure Laterality Date  . BACK SURGERY    . BREAST SURGERY  right partial mastectomy  06/2003  . KNEE SURGERY    . SKIN CANCER EXCISION      Allergies as of 07/23/2016      Reactions   Gabapentin    Per mar   Iodine    Per mar   Sulfonamide Derivatives    Per mar      Medication List       Accurate as of 07/23/16 11:59 PM. Always use your most recent med list.          calcium-vitamin D 500-200 MG-UNIT tablet Commonly known as:  OSCAL WITH D Take 1 tablet by mouth 3 (three) times daily with meals.   donepezil 10 MG tablet Commonly known as:  ARICEPT Take 10 mg by mouth at bedtime. For dementia   ferrous sulfate 220 (44 Fe) MG/5ML solution Take 220 mg by mouth daily.   memantine 10 MG tablet Commonly known as:  NAMENDA Take 10 mg by  mouth 2 (two) times daily. For dementia   mirtazapine 15 MG tablet Commonly known as:  REMERON Take 7.5 mg by mouth at bedtime. 1/2 tablet   sitaGLIPtin 100 MG tablet Commonly known as:  JANUVIA Take 100 mg by mouth daily.   TOUJEO SOLOSTAR 300 UNIT/ML Sopn Generic drug:  Insulin Glargine Inject 10 Units into the skin at bedtime.   UNABLE TO FIND Med Name: Med Pass  Give 90 ml nsa with medication three times a day       No orders of the defined types were placed in this encounter.   Immunization History  Administered Date(s) Administered  . Influenza Whole 12/27/2012  . Influenza-Unspecified 12/27/2013, 01/03/2016  . PPD Test 06/05/2009  . Pneumococcal Polysaccharide-23 01/17/2013  . Pneumococcal-Unspecified 02/16/2016    Social History  Substance Use Topics  . Smoking status: Former Research scientist (life sciences)  . Smokeless tobacco: Never Used  . Alcohol use No    Review of Systems  UTO 2/2 dementia    Vitals:   07/23/16 1435  BP: 120/61  Pulse: 72  Resp: 18  Temp: 97.8 F (36.6 C)   Body mass index is 27.84 kg/m. Physical Exam  GENERAL APPEARANCE: Alert, conversant, No acute distress  SKIN: No diaphoresis rash HEENT: Unremarkable RESPIRATORY: Breathing is even, unlabored. Lung sounds  are clear   CARDIOVASCULAR: Heart RRR no murmurs, rubs or gallops. No peripheral edema  GASTROINTESTINAL: Abdomen is soft, non-tender, not distended w/ normal bowel sounds.  GENITOURINARY: Bladder non tender, not distended  MUSCULOSKELETAL: No abnormal joints or musculature NEUROLOGIC: Cranial nerves 2-12 grossly intact. Moves all extremities PSYCHIATRIC: dementia, no behavioral issues  Patient Active Problem List   Diagnosis Date Noted  . Breast cancer ([place])   . Osteoporosis 06/23/2015  . Bradycardia 09/18/2014  . Prolonged QT interval 09/18/2014  . Hypocalcemia 05/27/2014  . FTT (failure to thrive) in adult 05/27/2014  . Dysphagia, oral phase 04/26/2014  . CKD (chronic kidney  disease) stage 2, GFR 60-89 ml/min 04/26/2014  . Type 2 diabetes mellitus without complication ([place]) 24/40/1027  . Hyperlipidemia   . Chronic renal disease, stage 2, mildly decreased glomerular filtration rate between 60-89 mL/min/1.73 square meter 10/14/2013  . Cough 06/29/2013  . Hypoglycemia 01/16/2013  . Psychosis 09/20/2012  . Diabetes mellitus type 2, uncontrolled, without complications ([place]) 25/36/6440  . Dementia with behavioral disturbance 08/19/2012  . Iron deficiency anemia 08/19/2012  . Depression 08/19/2012  . Malignant neoplasm of female breast ([place]) 06/30/2008  . ADENOCARCINOMA, OVARY 06/29/2008  . Anxiety state 01/16/2007    CMP     Component Value Date/Time   NA 147 06/16/2016   K 4.5 06/16/2016   CL 108 09/11/2014 0405   CO2 31 09/11/2014 0405   GLUCOSE 95 09/11/2014 0405   BUN 20 06/16/2016   CREATININE 0.8 06/16/2016   CREATININE 0.84 09/11/2014 0405   CALCIUM 7.3 (L) 09/11/2014 0405   PROT 5.4 (L) 09/10/2014 0300   ALBUMIN 2.3 (L) 09/10/2014 0300   AST 9 (A) 06/16/2016   ALT 4 (A) 12/24/2015   ALKPHOS 61 06/16/2016   BILITOT 0.2 (L) 09/10/2014 0300   GFRNONAA >60 09/11/2014 0405   GFRAA >60 09/11/2014 0405    Recent Labs  12/24/15 06/16/16  NA 144 147  K 4.0 4.5  BUN 20 20  CREATININE 0.9 0.8    Recent Labs  12/24/15 06/16/16  AST 9* 9*  ALT 4*  --   ALKPHOS 47  0*  0.3* 61    Recent Labs  12/24/15 06/16/16  WBC 4.9 5.1  HGB 11.1* 13.0  HCT 33* 41  PLT 275 244    Recent Labs  12/24/15 06/16/16  CHOL 140 153  LDLCALC 82 102  TRIG 106 67   Lab Results  Component Value Date   MICROALBUR 1.9 08/15/2015   Lab Results  Component Value Date   TSH 4.29 12/25/2015   Lab Results  Component Value Date   HGBA1C 7.1 06/16/2016   Lab Results  Component Value Date   CHOL 153 06/16/2016   HDL 38 06/16/2016   LDLCALC 102 06/16/2016   TRIG 67 06/16/2016    Significant Diagnostic Results in last 30 days:  No results  found.  Assessment and Plan  Dementia with behavioral disturbance Chronic and stable; cont namenda 10 mg BID and aricept 10 mg daily  FTT (failure to thrive) in adult Chronic and slow; plan cont supportive care and remeron 7.5 mg for appetitie  Psychosis Mood is stable on no meds;plan supportive care    Duaa Stelzner D. Sheppard Coil, MD

## 2016-08-02 ENCOUNTER — Encounter: Payer: Self-pay | Admitting: Internal Medicine

## 2016-08-02 NOTE — Assessment & Plan Note (Signed)
In April A1c was 7.1;plan to cont Januvia 100 mg daily and toujeo 10 u daily

## 2016-08-02 NOTE — Assessment & Plan Note (Signed)
No reports of aspiration or PNA;plan to cont mechanical soft pureed diet

## 2016-08-02 NOTE — Assessment & Plan Note (Signed)
Most recent Hb 13, excellent; cont iron 220 mg daily

## 2016-08-04 ENCOUNTER — Encounter: Payer: Self-pay | Admitting: Internal Medicine

## 2016-08-04 ENCOUNTER — Non-Acute Institutional Stay (SKILLED_NURSING_FACILITY): Payer: Medicare Other | Admitting: Internal Medicine

## 2016-08-04 DIAGNOSIS — S8291XD Unspecified fracture of right lower leg, subsequent encounter for closed fracture with routine healing: Secondary | ICD-10-CM | POA: Diagnosis not present

## 2016-08-04 NOTE — Progress Notes (Signed)
Location:  [facility] [place] Room Number: [phone] Place of Service:  SNF (31)  Hennie Duos, MD  Patient Care Team: Hennie Duos, MD as PCP - General (Internal Medicine)  Extended Emergency Contact Information Primary Emergency Contact: [name] States of [place] Phone: [phone] Work Phone: [phone] Mobile Phone: [phone] Relation: Niece Secondary Emergency Contact: [place]  [place] Phone: [phone] Relation: None    Allergies: Gabapentin; Iodine; and Sulfonamide derivatives  Chief Complaint  Patient presents with  . Acute Visit    Acute    HPI: Patient is 83 y.o. female who is being seen for assessment of need for pain med. Pt had an accident over the weekend and has sustained a RLE fracture (tib/fib) and has no pain mdication.  Past Medical History:  Diagnosis Date  . Alzheimer's disease 08/19/2012  . Asthma   . Breast cancer ([place])   . Cancer [facility])    Skin cancer  . Depression   . Diabetes mellitus   . Hyperlipidemia   . Ovarian cancer ([place])   . Type II or unspecified type diabetes mellitus without mention of complication, uncontrolled 08/19/2012    Past Surgical History:  Procedure Laterality Date  . BACK SURGERY    . BREAST SURGERY  right partial mastectomy  06/2003  . KNEE SURGERY    . SKIN CANCER EXCISION      Allergies as of 08/04/2016      Reactions   Gabapentin    Per mar   Iodine    Per mar   Sulfonamide Derivatives    Per mar      Medication List       Accurate as of 08/04/16  3:53 PM. Always use your most recent med list.          calcium-vitamin D 500-200 MG-UNIT tablet Commonly known as:  OSCAL WITH D Take 1 tablet by mouth 3 (three) times daily with meals.   donepezil 10 MG tablet Commonly known as:  ARICEPT Take 10 mg by mouth at bedtime. For dementia   ferrous sulfate 220 (44 Fe) MG/5ML solution Take 220 mg by mouth daily.     memantine 10 MG tablet Commonly known as:  NAMENDA Take 10 mg by mouth 2 (two) times daily. For dementia   mirtazapine 15 MG tablet Commonly known as:  REMERON Take 7.5 mg by mouth at bedtime. 1/2 tablet   sitaGLIPtin 100 MG tablet Commonly known as:  JANUVIA Take 100 mg by mouth daily.   TOUJEO SOLOSTAR 300 UNIT/ML Sopn Generic drug:  Insulin Glargine Inject 10 Units into the skin at bedtime.   UNABLE TO FIND Med Name: Med Pass  Give 90 ml nsa with medication three times a day       No orders of the defined types were placed in this encounter.   Immunization History  Administered Date(s) Administered  . Influenza Whole 12/27/2012  . Influenza-Unspecified 12/27/2013, 01/03/2016  . PPD Test 06/05/2009  . Pneumococcal Polysaccharide-23 01/17/2013  . Pneumococcal-Unspecified 02/16/2016    Social History  Substance Use Topics  . Smoking status: Former Research scientist (life sciences)  . Smokeless tobacco: Never Used  . Alcohol use No    Review of Systems  DATA OBTAINED: from patient, nurse GENERAL:  no fevers, fatigue, appetite changes SKIN: No itching, rash HEENT: No complaint RESPIRATORY: No cough, wheezing, SOB CARDIAC: No chest pain, palpitations, lower extremity edema  GI: No abdominal pain, No N/V/D or constipation, No  heartburn or reflux  GU: No dysuria, frequency or urgency, or incontinence  MUSCULOSKELETAL: denies pain to leg NEUROLOGIC: No headache, dizziness  PSYCHIATRIC: No overt anxiety or sadness  Vitals:   08/04/16 1551  BP: 120/61  Pulse: 69  Resp: 18  Temp: 97.8 F (36.6 C)   Body mass index is 27.84 kg/m. Physical Exam  GENERAL APPEARANCE: Alert, conversant, No acute distress  SKIN: No diaphoresis rash HEENT: Unremarkable RESPIRATORY: Breathing is even, unlabored. Lung sounds are clear   CARDIOVASCULAR: Heart RRR no murmurs, rubs or gallops. No peripheral edema  GASTROINTESTINAL: Abdomen is soft, non-tender, not distended w/ normal bowel sounds.   GENITOURINARY: Bladder non tender, not distended  MUSCULOSKELETAL: Pt with splint in place RLE - skin of foot with good color, cap refill NEUROLOGIC: Cranial nerves 2-12 grossly intact. Moves all extremities PSYCHIATRIC: Mood and affect appropriate to situation with dementia, no behavioral issues  Patient Active Problem List   Diagnosis Date Noted  . Breast cancer ([place])   . Osteoporosis 06/23/2015  . Bradycardia 09/18/2014  . Prolonged QT interval 09/18/2014  . Hypocalcemia 05/27/2014  . FTT (failure to thrive) in adult 05/27/2014  . Dysphagia, oral phase 04/26/2014  . CKD (chronic kidney disease) stage 2, GFR 60-89 ml/min 04/26/2014  . Type 2 diabetes mellitus without complication ([place]) 29/52/8413  . Hyperlipidemia   . Chronic renal disease, stage 2, mildly decreased glomerular filtration rate between 60-89 mL/min/1.73 square meter 10/14/2013  . Cough 06/29/2013  . Hypoglycemia 01/16/2013  . Psychosis 09/20/2012  . Diabetes mellitus type 2, uncontrolled, without complications ([place]) 24/40/1027  . Dementia with behavioral disturbance 08/19/2012  . Iron deficiency anemia 08/19/2012  . Depression 08/19/2012  . Malignant neoplasm of female breast ([place]) 06/30/2008  . ADENOCARCINOMA, OVARY 06/29/2008  . Anxiety state 01/16/2007    CMP     Component Value Date/Time   NA 147 06/16/2016   K 4.5 06/16/2016   CL 108 09/11/2014 0405   CO2 31 09/11/2014 0405   GLUCOSE 95 09/11/2014 0405   BUN 20 06/16/2016   CREATININE 0.8 06/16/2016   CREATININE 0.84 09/11/2014 0405   CALCIUM 7.3 (L) 09/11/2014 0405   PROT 5.4 (L) 09/10/2014 0300   ALBUMIN 2.3 (L) 09/10/2014 0300   AST 9 (A) 06/16/2016   ALT 4 (A) 12/24/2015   ALKPHOS 61 06/16/2016   BILITOT 0.2 (L) 09/10/2014 0300   GFRNONAA >60 09/11/2014 0405   GFRAA >60 09/11/2014 0405    Recent Labs  12/24/15 06/16/16  NA 144 147  K 4.0 4.5  BUN 20 20  CREATININE 0.9 0.8    Recent Labs  12/24/15 06/16/16  AST 9* 9*  ALT 4*   --   ALKPHOS 47  0*  0.3* 61    Recent Labs  12/24/15 06/16/16  WBC 4.9 5.1  HGB 11.1* 13.0  HCT 33* 41  PLT 275 244    Recent Labs  12/24/15 06/16/16  CHOL 140 153  LDLCALC 82 102  TRIG 106 67   Lab Results  Component Value Date   MICROALBUR 1.9 08/15/2015   Lab Results  Component Value Date   TSH 4.29 12/25/2015   Lab Results  Component Value Date   HGBA1C 7.1 06/16/2016   Lab Results  Component Value Date   CHOL 153 06/16/2016   HDL 38 06/16/2016   LDLCALC 102 06/16/2016   TRIG 67 06/16/2016    Significant Diagnostic Results in last 30 days:  No results found.  Assessment and Plan  R tib/fib fracture - pt does not appear in pain; however I feel she will need pain medication; have ordered tramdol 50 mg BID scheduled     Webb Silversmith D. Sheppard Coil, MD

## 2016-08-11 ENCOUNTER — Encounter: Payer: Self-pay | Admitting: Internal Medicine

## 2016-08-11 NOTE — Assessment & Plan Note (Signed)
Chronic and stable; cont namenda 10 mg BID and aricept 10 mg daily

## 2016-08-11 NOTE — Assessment & Plan Note (Signed)
Mood is stable on no meds;plan supportive care

## 2016-08-11 NOTE — Assessment & Plan Note (Signed)
Chronic and slow; plan cont supportive care and remeron 7.5 mg for appetitie

## 2016-08-25 ENCOUNTER — Encounter: Payer: Self-pay | Admitting: Internal Medicine

## 2016-08-25 ENCOUNTER — Non-Acute Institutional Stay (SKILLED_NURSING_FACILITY): Payer: Medicare Other | Admitting: Internal Medicine

## 2016-08-25 DIAGNOSIS — D508 Other iron deficiency anemias: Secondary | ICD-10-CM | POA: Diagnosis not present

## 2016-08-25 DIAGNOSIS — M8000XD Age-related osteoporosis with current pathological fracture, unspecified site, subsequent encounter for fracture with routine healing: Secondary | ICD-10-CM | POA: Diagnosis not present

## 2016-08-25 DIAGNOSIS — N182 Chronic kidney disease, stage 2 (mild): Secondary | ICD-10-CM

## 2016-08-25 NOTE — Progress Notes (Signed)
Location:  [facility] [place] Room Number: [phone] Place of Service:  SNF (31)  Hennie Duos, MD  Patient Care Team: Hennie Duos, MD as PCP - General (Internal Medicine)  Extended Emergency Contact Information Primary Emergency Contact: [name] [place] of [place] Phone: [phone] Work Phone: [phone] Mobile Phone: [phone] Relation: Niece Secondary Emergency Contact: [place]  [place] Phone: [phone] Relation: None    Allergies: Gabapentin; Iodine; and Sulfonamide derivatives  Chief Complaint  Patient presents with  . Medical Management of Chronic Issues    Routine Visit    HPI: Patient is 83 y.o. female who Is being seen for routine issues of iron deficiency anemia, CK D2, and osteoporosis.  Past Medical History:  Diagnosis Date  . Allergic rhinitis, unspecified   . Alzheimer's disease 08/19/2012  . Anemia in other chronic diseases classified elsewhere   . Asthma   . Breast cancer ([place])   . Cancer [facility])    Skin cancer  . Depression   . Diabetes mellitus   . Hyperlipidemia   . Hypocalcemia   . Ovarian cancer ([place])   . Personal history of malignant neoplasm of ovary   . Type II or unspecified type diabetes mellitus without mention of complication, uncontrolled 08/19/2012    Past Surgical History:  Procedure Laterality Date  . BACK SURGERY    . BREAST SURGERY  right partial mastectomy  06/2003  . KNEE SURGERY    . SKIN CANCER EXCISION      Allergies as of 08/25/2016      Reactions   Gabapentin    Per mar   Iodine    Per mar   Sulfonamide Derivatives    Per mar      Medication List       Accurate as of 08/25/16 11:59 PM. Always use your most recent med list.          acetaminophen 500 MG tablet Commonly known as:  TYLENOL Take 1,000 mg by mouth 3 (three) times daily.   calcium-vitamin D 500-200 MG-UNIT tablet Commonly known as:  OSCAL WITH D Take 1 tablet  by mouth 3 (three) times daily with meals.   donepezil 10 MG tablet Commonly known as:  ARICEPT Take 10 mg by mouth at bedtime. For dementia   ferrous sulfate 220 (44 Fe) MG/5ML solution Take 220 mg by mouth daily. 7.5 ml   memantine 10 MG tablet Commonly known as:  NAMENDA Take 10 mg by mouth 2 (two) times daily. For dementia   sitaGLIPtin 100 MG tablet Commonly known as:  JANUVIA Take 100 mg by mouth daily.   TOUJEO SOLOSTAR 300 UNIT/ML Sopn Generic drug:  Insulin Glargine Inject 10 Units into the skin at bedtime.   UNABLE TO FIND Med Name: Med Pass  Give 90 ml nsa with medication three times a day   Vitamin D3 50000 units Caps Take 1 capsule by mouth every 30 (thirty) days.       Meds ordered this encounter  Medications  . Cholecalciferol (VITAMIN D3) 50000 units CAPS    Sig: Take 1 capsule by mouth every 30 (thirty) days.  Marland Kitchen acetaminophen (TYLENOL) 500 MG tablet    Sig: Take 1,000 mg by mouth 3 (three) times daily.    Immunization History  Administered Date(s) Administered  . Influenza Whole 12/27/2012  . Influenza-Unspecified 12/27/2013, 01/03/2016  . PPD Test 06/05/2009  . Pneumococcal Polysaccharide-23 01/17/2013  . Pneumococcal-Unspecified 02/16/2016  Social History  Substance Use Topics  . Smoking status: Former Research scientist (life sciences)  . Smokeless tobacco: Never Used  . Alcohol use No    Review of Systems  DATA OBTAINED: from patient-very limited, she knows if she is in pain, nurse-no new concerns GENERAL:  no fevers, fatigue, appetite changes SKIN: No itching, rash HEENT: No complaint RESPIRATORY: No cough, wheezing, SOB CARDIAC: No chest pain, palpitations, lower extremity edema  GI: No abdominal pain, No N/V/D or constipation, No heartburn or reflux  GU: No dysuria, frequency or urgency, or incontinence  MUSCULOSKELETAL: No unrelieved bone/joint pain NEUROLOGIC: No headache, dizziness  PSYCHIATRIC: No overt anxiety or sadness  Vitals:   08/25/16 1343   BP: 120/61  Pulse: 76  Resp: 16  Temp: 97.8 F (36.6 C)   Body mass index is 27.46 kg/m. Physical Exam  GENERAL APPEARANCE: Alert, No acute distress  SKIN: No diaphoresis rash HEENT: Unremarkable RESPIRATORY: Breathing is even, unlabored. Lung sounds are clear   CARDIOVASCULAR: Heart RRR no murmurs, rubs or gallops. No peripheral edema  GASTROINTESTINAL: Abdomen is soft, non-tender, not distended w/ normal bowel sounds.  GENITOURINARY: Bladder non tender, not distended  MUSCULOSKELETAL: Cast on right leg NEUROLOGIC: Cranial nerves 2-12 grossly intact. Moves all extremities PSYCHIATRIC: Dementia, no behavioral issues  Patient Active Problem List   Diagnosis Date Noted  . Breast cancer ([place])   . Osteoporosis 06/23/2015  . Bradycardia 09/18/2014  . Prolonged QT interval 09/18/2014  . Hypocalcemia 05/27/2014  . FTT (failure to thrive) in adult 05/27/2014  . Dysphagia, oral phase 04/26/2014  . CKD (chronic kidney disease) stage 2, GFR 60-89 ml/min 04/26/2014  . Type 2 diabetes mellitus without complication ([place]) 16/96/7893  . Hyperlipidemia   . Chronic renal disease, stage 2, mildly decreased glomerular filtration rate between 60-89 mL/min/1.73 square meter 10/14/2013  . Cough 06/29/2013  . Hypoglycemia 01/16/2013  . Psychosis 09/20/2012  . Diabetes mellitus type 2, uncontrolled, without complications ([place]) 81/03/7508  . Dementia with behavioral disturbance 08/19/2012  . Iron deficiency anemia 08/19/2012  . Depression 08/19/2012  . Malignant neoplasm of female breast ([place]) 06/30/2008  . ADENOCARCINOMA, OVARY 06/29/2008  . Anxiety state 01/16/2007    CMP     Component Value Date/Time   NA 147 06/16/2016   K 4.5 06/16/2016   CL 108 09/11/2014 0405   CO2 31 09/11/2014 0405   GLUCOSE 95 09/11/2014 0405   BUN 20 06/16/2016   CREATININE 0.8 06/16/2016   CREATININE 0.84 09/11/2014 0405   CALCIUM 7.3 (L) 09/11/2014 0405   PROT 5.4 (L) 09/10/2014 0300   ALBUMIN 2.3 (L)  09/10/2014 0300   AST 9 (A) 06/16/2016   ALT 4 (A) 12/24/2015   ALKPHOS 61 06/16/2016   BILITOT 0.2 (L) 09/10/2014 0300   GFRNONAA >60 09/11/2014 0405   GFRAA >60 09/11/2014 0405    Recent Labs  12/24/15 06/16/16  NA 144 147  K 4.0 4.5  BUN 20 20  CREATININE 0.9 0.8    Recent Labs  12/24/15 06/16/16  AST 9* 9*  ALT 4*  --   ALKPHOS 47  0*  0.3* 61    Recent Labs  12/24/15 06/16/16  WBC 4.9 5.1  HGB 11.1* 13.0  HCT 33* 41  PLT 275 244    Recent Labs  12/24/15 06/16/16  CHOL 140 153  LDLCALC 82 102  TRIG 106 67   Lab Results  Component Value Date   MICROALBUR 1.3 08/29/2016   Lab Results  Component Value Date  TSH 4.29 12/25/2015   Lab Results  Component Value Date   HGBA1C 7.1 06/16/2016   Lab Results  Component Value Date   CHOL 153 06/16/2016   HDL 38 06/16/2016   LDLCALC 102 06/16/2016   TRIG 67 06/16/2016    Significant Diagnostic Results in last 30 days:  No results found.  Assessment and Plan  Iron deficiency anemia Stable; plan to continue iron solution 330 mg by mouth daily  Chronic renal disease, stage 2, mildly decreased glomerular filtration rate between 60-89 mL/min/1.73 square meter No recent GFR but most recent BUN/creatinine was 20/0.8 which is very stable; plan to monitor intervals  Osteoporosis Patient with recent fracture; plan to continue Os-Cal plus D5 100 200 by mouth daily and vitamin D 50,000 units by mouth monthly     Anne D. Sheppard Coil, MD

## 2016-08-27 ENCOUNTER — Encounter: Payer: Self-pay | Admitting: Internal Medicine

## 2016-08-27 NOTE — Progress Notes (Signed)
Location:  [facility] Room Number: [phone] Place of Service:  SNF (31)  Hennie Duos, MD  Patient Care Team: Hennie Duos, MD as PCP - General (Internal Medicine)  Extended Emergency Contact Information Primary Emergency Contact: [name] [place] of [place] Phone: [phone] Work Phone: [phone] Mobile Phone: [phone] Relation: Niece Secondary Emergency Contact: [place]  [place] Phone: [phone] Relation: None    Allergies: Gabapentin; Iodine; and Sulfonamide derivatives  Chief Complaint  Patient presents with  . Acute Visit    Acute    HPI: Patient is 83 y.o. female who   Past Medical History:  Diagnosis Date  . Alzheimer's disease 08/19/2012  . Asthma   . Breast cancer ([place])   . Cancer [facility])    Skin cancer  . Depression   . Diabetes mellitus   . Hyperlipidemia   . Ovarian cancer ([place])   . Type II or unspecified type diabetes mellitus without mention of complication, uncontrolled 08/19/2012    Past Surgical History:  Procedure Laterality Date  . BACK SURGERY    . BREAST SURGERY  right partial mastectomy  06/2003  . KNEE SURGERY    . SKIN CANCER EXCISION      Allergies as of 08/27/2016      Reactions   Gabapentin    Per mar   Iodine    Per mar   Sulfonamide Derivatives    Per mar      Medication List       Accurate as of 08/27/16 11:46 AM. Always use your most recent med list.          acetaminophen 500 MG tablet Commonly known as:  TYLENOL Take 1,000 mg by mouth 3 (three) times daily.   calcium-vitamin D 500-200 MG-UNIT tablet Commonly known as:  OSCAL WITH D Take 1 tablet by mouth 3 (three) times daily with meals.   donepezil 10 MG tablet Commonly known as:  ARICEPT Take 10 mg by mouth at bedtime. For dementia   ferrous sulfate 220 (44 Fe) MG/5ML solution Take 220 mg by mouth daily. 7.5 ml   memantine 10 MG tablet Commonly known as:   NAMENDA Take 10 mg by mouth 2 (two) times daily. For dementia   sitaGLIPtin 100 MG tablet Commonly known as:  JANUVIA Take 100 mg by mouth daily.   TOUJEO SOLOSTAR 300 UNIT/ML Sopn Generic drug:  Insulin Glargine Inject 10 Units into the skin at bedtime.   UNABLE TO FIND Med Name: Med Pass  Give 90 ml nsa with medication three times a day   Vitamin D3 50000 units Caps Take 1 capsule by mouth every 30 (thirty) days.       No orders of the defined types were placed in this encounter.   Immunization History  Administered Date(s) Administered  . Influenza Whole 12/27/2012  . Influenza-Unspecified 12/27/2013, 01/03/2016  . PPD Test 06/05/2009  . Pneumococcal Polysaccharide-23 01/17/2013  . Pneumococcal-Unspecified 02/16/2016    Social History  Substance Use Topics  . Smoking status: Former Research scientist (life sciences)  . Smokeless tobacco: Never Used  . Alcohol use No    Review of Systems  DATA OBTAINED: from patient, nurse, medical record, family member GENERAL:  no fevers, fatigue, appetite changes SKIN: No itching, rash HEENT: No complaint RESPIRATORY: No cough, wheezing, SOB CARDIAC: No chest pain, palpitations, lower extremity edema  GI: No abdominal pain, No N/V/D or constipation, No heartburn or reflux  GU: No dysuria, frequency  or urgency, or incontinence  MUSCULOSKELETAL: No unrelieved bone/joint pain NEUROLOGIC: No headache, dizziness  PSYCHIATRIC: No overt anxiety or sadness  Vitals:   08/27/16 1143  BP: 120/61  Pulse: 73  Resp: 18  Temp: 97.8 F (36.6 C)   Body mass index is 27.46 kg/m. Physical Exam  GENERAL APPEARANCE: Alert, conversant, No acute distress  SKIN: No diaphoresis rash HEENT: Unremarkable RESPIRATORY: Breathing is even, unlabored. Lung sounds are clear   CARDIOVASCULAR: Heart RRR no murmurs, rubs or gallops. No peripheral edema  GASTROINTESTINAL: Abdomen is soft, non-tender, not distended w/ normal bowel sounds.  GENITOURINARY: Bladder non  tender, not distended  MUSCULOSKELETAL: No abnormal joints or musculature NEUROLOGIC: Cranial nerves 2-12 grossly intact. Moves all extremities PSYCHIATRIC: Mood and affect appropriate to situation, no behavioral issues  Patient Active Problem List   Diagnosis Date Noted  . Breast cancer ([place])   . Osteoporosis 06/23/2015  . Bradycardia 09/18/2014  . Prolonged QT interval 09/18/2014  . Hypocalcemia 05/27/2014  . FTT (failure to thrive) in adult 05/27/2014  . Dysphagia, oral phase 04/26/2014  . CKD (chronic kidney disease) stage 2, GFR 60-89 ml/min 04/26/2014  . Type 2 diabetes mellitus without complication ([place]) 70/78/6754  . Hyperlipidemia   . Chronic renal disease, stage 2, mildly decreased glomerular filtration rate between 60-89 mL/min/1.73 square meter 10/14/2013  . Cough 06/29/2013  . Hypoglycemia 01/16/2013  . Psychosis 09/20/2012  . Diabetes mellitus type 2, uncontrolled, without complications ([place]) 49/20/1007  . Dementia with behavioral disturbance 08/19/2012  . Iron deficiency anemia 08/19/2012  . Depression 08/19/2012  . Malignant neoplasm of female breast ([place]) 06/30/2008  . ADENOCARCINOMA, OVARY 06/29/2008  . Anxiety state 01/16/2007    CMP     Component Value Date/Time   NA 147 06/16/2016   K 4.5 06/16/2016   CL 108 09/11/2014 0405   CO2 31 09/11/2014 0405   GLUCOSE 95 09/11/2014 0405   BUN 20 06/16/2016   CREATININE 0.8 06/16/2016   CREATININE 0.84 09/11/2014 0405   CALCIUM 7.3 (L) 09/11/2014 0405   PROT 5.4 (L) 09/10/2014 0300   ALBUMIN 2.3 (L) 09/10/2014 0300   AST 9 (A) 06/16/2016   ALT 4 (A) 12/24/2015   ALKPHOS 61 06/16/2016   BILITOT 0.2 (L) 09/10/2014 0300   GFRNONAA >60 09/11/2014 0405   GFRAA >60 09/11/2014 0405    Recent Labs  12/24/15 06/16/16  NA 144 147  K 4.0 4.5  BUN 20 20  CREATININE 0.9 0.8    Recent Labs  12/24/15 06/16/16  AST 9* 9*  ALT 4*  --   ALKPHOS 47  0*  0.3* 61    Recent Labs  12/24/15 06/16/16  WBC 4.9  5.1  HGB 11.1* 13.0  HCT 33* 41  PLT 275 244    Recent Labs  12/24/15 06/16/16  CHOL 140 153  LDLCALC 82 102  TRIG 106 67   Lab Results  Component Value Date   MICROALBUR 1.9 08/15/2015   Lab Results  Component Value Date   TSH 4.29 12/25/2015   Lab Results  Component Value Date   HGBA1C 7.1 06/16/2016   Lab Results  Component Value Date   CHOL 153 06/16/2016   HDL 38 06/16/2016   LDLCALC 102 06/16/2016   TRIG 67 06/16/2016    Significant Diagnostic Results in last 30 days:  No results found.  Assessment and Plan  No problem-specific Assessment & Plan notes found for this encounter.   Labs/tests ordered:   Noah Delaine. Sheppard Coil, MD

## 2016-08-29 LAB — MICROALBUMIN, URINE: Microalb, Ur: 1.3

## 2016-09-04 ENCOUNTER — Encounter: Payer: Self-pay | Admitting: Internal Medicine

## 2016-09-04 ENCOUNTER — Non-Acute Institutional Stay (SKILLED_NURSING_FACILITY): Payer: Medicare Other | Admitting: Internal Medicine

## 2016-09-04 DIAGNOSIS — F0281 Dementia in other diseases classified elsewhere with behavioral disturbance: Secondary | ICD-10-CM | POA: Diagnosis not present

## 2016-09-04 DIAGNOSIS — G301 Alzheimer's disease with late onset: Secondary | ICD-10-CM

## 2016-09-04 DIAGNOSIS — F02818 Dementia in other diseases classified elsewhere, unspecified severity, with other behavioral disturbance: Secondary | ICD-10-CM

## 2016-09-04 DIAGNOSIS — M8000XD Age-related osteoporosis with current pathological fracture, unspecified site, subsequent encounter for fracture with routine healing: Secondary | ICD-10-CM

## 2016-09-04 DIAGNOSIS — F32A Depression, unspecified: Secondary | ICD-10-CM

## 2016-09-04 DIAGNOSIS — D508 Other iron deficiency anemias: Secondary | ICD-10-CM | POA: Diagnosis not present

## 2016-09-04 DIAGNOSIS — F329 Major depressive disorder, single episode, unspecified: Secondary | ICD-10-CM

## 2016-09-04 DIAGNOSIS — E119 Type 2 diabetes mellitus without complications: Secondary | ICD-10-CM

## 2016-09-04 DIAGNOSIS — N182 Chronic kidney disease, stage 2 (mild): Secondary | ICD-10-CM | POA: Diagnosis not present

## 2016-09-04 DIAGNOSIS — Z794 Long term (current) use of insulin: Secondary | ICD-10-CM

## 2016-09-04 DIAGNOSIS — F29 Unspecified psychosis not due to a substance or known physiological condition: Secondary | ICD-10-CM

## 2016-09-04 DIAGNOSIS — E785 Hyperlipidemia, unspecified: Secondary | ICD-10-CM

## 2016-09-04 NOTE — Progress Notes (Signed)
Location:  [facility] Room Number: [place] of Service:  SNF (31)  PCP: Hennie Duos, MD Patient Care Team: Hennie Duos, MD as PCP - General (Internal Medicine)  Extended Emergency Contact Information Primary Emergency Contact: [name] [place] of [place] Phone: [phone] Work Phone: [phone] Mobile Phone: [phone] Relation: Niece Secondary Emergency Contact: [place]  [place] Phone: [phone] Relation: None  Allergies  Allergen Reactions  . Gabapentin     Per mar  . Iodine     Per mar  . Sulfonamide Derivatives     Per mar    Chief Complaint  Patient presents with  . Discharge Note    discharge to [facility]    HPI:  83 y.o. female  With /o DM2, depression ,Alzheimers dx HLD, recent ankle fracture who is being d/c to [facility] SNF at family request.    Past Medical History:  Diagnosis Date  . Allergic rhinitis, unspecified   . Alzheimer's disease 08/19/2012  . Anemia in other chronic diseases classified elsewhere   . Asthma   . Breast cancer ([place])   . Cancer [facility])    Skin cancer  . Depression   . Diabetes mellitus   . Hyperlipidemia   . Hypocalcemia   . Ovarian cancer ([place])   . Personal history of malignant neoplasm of ovary   . Type II or unspecified type diabetes mellitus without mention of complication, uncontrolled 08/19/2012    Past Surgical History:  Procedure Laterality Date  . BACK SURGERY    . BREAST SURGERY  right partial mastectomy  06/2003  . KNEE SURGERY    . SKIN CANCER EXCISION       reports that she has quit smoking. She has never used smokeless tobacco. She reports that she does not drink alcohol or use drugs. Social History   Social History  . Marital status: Divorced    Spouse name: N/A  . Number of children: 0  . Years of education: N/A   Occupational History  . [employer]    Social History Main Topics    . Smoking status: Former Research scientist (life sciences)  . Smokeless tobacco: Never Used  . Alcohol use No  . Drug use: No  . Sexual activity: Not on file   Other Topics Concern  . Not on file   Social History Narrative   Admitted to [facility] 05/22/09   Divorced   Former smoker   Alcohol none   DNR       Pertinent  Health Maintenance Due  Topic Date Due  . INFLUENZA VACCINE  10/15/2016  . HEMOGLOBIN A1C  12/16/2016  . PNA vac Low Risk Adult (2 of 2 - PCV13) 02/15/2017  . OPHTHALMOLOGY EXAM  05/13/2017  . FOOT EXAM  06/04/2017  . URINE MICROALBUMIN  08/29/2017  . DEXA SCAN  Completed    Medications: Allergies as of 09/04/2016      Reactions   Gabapentin    Per mar   Iodine    Per mar   Sulfonamide Derivatives    Per mar      Medication List       Accurate as of 09/04/16 11:35 AM. Always use your most recent med list.          acetaminophen 500 MG tablet Commonly known as:  TYLENOL Take 1,000 mg by mouth 3 (three) times daily.   calcium-vitamin D 500-200  MG-UNIT tablet Commonly known as:  OSCAL WITH D Take 1 tablet by mouth 3 (three) times daily with meals.   donepezil 10 MG tablet Commonly known as:  ARICEPT Take 10 mg by mouth at bedtime. For dementia   ferrous sulfate 220 (44 Fe) MG/5ML solution Take 220 mg by mouth daily. 7.5 ml   memantine 10 MG tablet Commonly known as:  NAMENDA Take 10 mg by mouth 2 (two) times daily. For dementia   sitaGLIPtin 100 MG tablet Commonly known as:  JANUVIA Take 100 mg by mouth daily.   TOUJEO SOLOSTAR 300 UNIT/ML Sopn Generic drug:  Insulin Glargine Inject 10 Units into the skin at bedtime.   UNABLE TO FIND Med Name: Med Pass  Give 90 ml nsa with medication three times a day   Vitamin D3 50000 units Caps Take 1 capsule by mouth every 30 (thirty) days.        Vitals:   09/04/16 0837  BP: 120/61  Pulse: 73  Resp: 18  Temp: 97.8 F (36.6 C)  Weight: 160 lb (72.6 kg)  Height: 5\' 4"  (1.626 m)   Body  mass index is 27.46 kg/m.  Physical Exam  GENERAL APPEARANCE: Alert,  No acute distress.  HEENT: Unremarkable. RESPIRATORY: Breathing is even, unlabored. Lung sounds are clear   CARDIOVASCULAR: Heart RRR no murmurs, rubs or gallops. No peripheral edema.  GASTROINTESTINAL: Abdomen is soft, non-tender, not distended w/ normal bowel sounds.  NEUROLOGIC: Cranial nerves 2-12 grossly intact. Moves all extremities   Labs reviewed: Basic Metabolic Panel:  Recent Labs  12/24/15 06/16/16  NA 144 147  K 4.0 4.5  BUN 20 20  CREATININE 0.9 0.8   Lab Results  Component Value Date   MICROALBUR 1.3 08/29/2016   Liver Function Tests:  Recent Labs  12/24/15 06/16/16  AST 9* 9*  ALT 4*  --   ALKPHOS 47  0*  0.3* 61   No results for input(s): LIPASE, AMYLASE in the last 8760 hours. No results for input(s): AMMONIA in the last 8760 hours. CBC:  Recent Labs  12/24/15 06/16/16  WBC 4.9 5.1  HGB 11.1* 13.0  HCT 33* 41  PLT 275 244   Lipid  Recent Labs  12/24/15 06/16/16  CHOL 140 153  HDL 37 38  LDLCALC 82 102  TRIG 106 67   Cardiac Enzymes: No results for input(s): CKTOTAL, CKMB, CKMBINDEX, TROPONINI in the last 8760 hours. BNP: No results for input(s): BNP in the last 8760 hours. CBG: No results for input(s): GLUCAP in the last 8760 hours.  Procedures and Imaging Studies During Stay: No results found.  Assessment/Plan:   Type 2 diabetes mellitus without complication, with long-term current use of insulin ([place])  Late onset Alzheimer's disease with behavioral disturbance  Age-related osteoporosis with current pathological fracture with routine healing, subsequent encounter  Chronic renal disease, stage 2, mildly decreased glomerular filtration rate between 60-89 mL/min/1.73 square meter  Depression, unspecified depression type  Hyperlipidemia, unspecified hyperlipidemia type  Iron deficiency anemia secondary to inadequate dietary iron intake  Psychosis,  unspecified psychosis type   Patient is being discharged with the following home health services:  None-pt being d/c to SNF  Patient is being discharged with the following durable medical equipment:  none  Patient has been advised to f/u with their PCP in 1-2 weeks to bring them up to date on their rehab stay.  Social services at facility was responsible for arranging this appointment.  Pt was provided with a 30  day supply of prescriptions for medications and refills must be obtained from their PCP.  For controlled substances, a more limited supply may be provided adequate until PCP appointment only.  Medications have been verified.   Time spent > 30 min;> 50% of time with patient was spent reviewing records, labs, tests and studies, counseling and developing plan of care  Donald Pore, MD

## 2016-09-19 ENCOUNTER — Encounter: Payer: Self-pay | Admitting: Internal Medicine

## 2016-10-01 ENCOUNTER — Encounter: Payer: Self-pay | Admitting: Internal Medicine

## 2016-10-01 NOTE — Assessment & Plan Note (Signed)
No recent GFR but most recent BUN/creatinine was 20/0.8 which is very stable; plan to monitor intervals

## 2016-10-01 NOTE — Assessment & Plan Note (Signed)
Stable; plan to continue iron solution 330 mg by mouth daily

## 2016-10-01 NOTE — Assessment & Plan Note (Signed)
Patient with recent fracture; plan to continue Os-Cal plus D5 100 200 by mouth daily and vitamin D 50,000 units by mouth monthly

## 2016-10-05 NOTE — Progress Notes (Signed)
This encounter was created in error - please disregard.

## 2016-10-17 ENCOUNTER — Emergency Department (HOSPITAL_COMMUNITY): Payer: Medicare Other

## 2016-10-17 ENCOUNTER — Emergency Department (HOSPITAL_COMMUNITY)
Admission: EM | Admit: 2016-10-17 | Discharge: 2016-10-18 | Disposition: A | Discharge status: Home / Self Care | Payer: Medicare Other | Attending: Emergency Medicine | Admitting: Emergency Medicine

## 2016-10-17 ENCOUNTER — Encounter (HOSPITAL_COMMUNITY): Payer: Self-pay | Admitting: Emergency Medicine

## 2016-10-17 DIAGNOSIS — E11649 Type 2 diabetes mellitus with hypoglycemia without coma: Secondary | ICD-10-CM | POA: Insufficient documentation

## 2016-10-17 DIAGNOSIS — E1122 Type 2 diabetes mellitus with diabetic chronic kidney disease: Secondary | ICD-10-CM | POA: Insufficient documentation

## 2016-10-17 DIAGNOSIS — N182 Chronic kidney disease, stage 2 (mild): Secondary | ICD-10-CM | POA: Diagnosis not present

## 2016-10-17 DIAGNOSIS — X58XXXD Exposure to other specified factors, subsequent encounter: Secondary | ICD-10-CM | POA: Diagnosis not present

## 2016-10-17 DIAGNOSIS — Z794 Long term (current) use of insulin: Secondary | ICD-10-CM | POA: Diagnosis not present

## 2016-10-17 DIAGNOSIS — J45909 Unspecified asthma, uncomplicated: Secondary | ICD-10-CM | POA: Diagnosis not present

## 2016-10-17 DIAGNOSIS — I739 Peripheral vascular disease, unspecified: Secondary | ICD-10-CM

## 2016-10-17 DIAGNOSIS — Z853 Personal history of malignant neoplasm of breast: Secondary | ICD-10-CM | POA: Insufficient documentation

## 2016-10-17 DIAGNOSIS — Z79899 Other long term (current) drug therapy: Secondary | ICD-10-CM | POA: Insufficient documentation

## 2016-10-17 DIAGNOSIS — G309 Alzheimer's disease, unspecified: Secondary | ICD-10-CM | POA: Diagnosis not present

## 2016-10-17 DIAGNOSIS — Z8543 Personal history of malignant neoplasm of ovary: Secondary | ICD-10-CM | POA: Diagnosis not present

## 2016-10-17 DIAGNOSIS — Z87891 Personal history of nicotine dependence: Secondary | ICD-10-CM | POA: Diagnosis not present

## 2016-10-17 DIAGNOSIS — S8292XD Unspecified fracture of left lower leg, subsequent encounter for closed fracture with routine healing: Secondary | ICD-10-CM | POA: Insufficient documentation

## 2016-10-17 DIAGNOSIS — I999 Unspecified disorder of circulatory system: Secondary | ICD-10-CM | POA: Diagnosis present

## 2016-10-17 LAB — COMPREHENSIVE METABOLIC PANEL
ALBUMIN: 2.8 g/dL — AB (ref 3.5–5.0)
ALK PHOS: 58 U/L (ref 38–126)
ALT: 21 U/L (ref 14–54)
AST: 43 U/L — AB (ref 15–41)
Anion gap: 8 (ref 5–15)
BILIRUBIN TOTAL: 0.4 mg/dL (ref 0.3–1.2)
BUN: 18 mg/dL (ref 6–20)
CO2: 30 mmol/L (ref 22–32)
CREATININE: 0.85 mg/dL (ref 0.44–1.00)
Calcium: 8.7 mg/dL — ABNORMAL LOW (ref 8.9–10.3)
Chloride: 107 mmol/L (ref 101–111)
GFR calc Af Amer: >60 mL/min (ref >60)
GLUCOSE: 53 mg/dL — AB (ref 65–99)
POTASSIUM: 3.9 mmol/L (ref 3.5–5.1)
Sodium: 145 mmol/L (ref 135–145)
TOTAL PROTEIN: 6.5 g/dL (ref 6.5–8.1)

## 2016-10-17 LAB — URINALYSIS, ROUTINE W REFLEX MICROSCOPIC
BILIRUBIN URINE: NEGATIVE
Glucose, UA: NEGATIVE mg/dL
KETONES UR: NEGATIVE mg/dL
Nitrite: NEGATIVE
PROTEIN: 100 mg/dL — AB
Specific Gravity, Urine: 1.026 (ref 1.005–1.030)
pH: 5 (ref 5.0–8.0)

## 2016-10-17 LAB — CBC WITH DIFFERENTIAL/PLATELET
BASOS ABS: 0 10*3/uL (ref 0.0–0.1)
Basophils Relative: 0 %
EOS ABS: 0 10*3/uL (ref 0.0–0.7)
EOS PCT: 0 %
HCT: 39.9 % (ref 36.0–46.0)
Hemoglobin: 12.8 g/dL (ref 12.0–15.0)
LYMPHS PCT: 15 %
Lymphs Abs: 1.7 10*3/uL (ref 0.7–4.0)
MCH: 29.2 pg (ref 26.0–34.0)
MCHC: 32.1 g/dL (ref 30.0–36.0)
MCV: 90.9 fL (ref 78.0–100.0)
MONO ABS: 0.8 10*3/uL (ref 0.1–1.0)
Monocytes Relative: 7 %
Neutro Abs: 8.9 10*3/uL — ABNORMAL HIGH (ref 1.7–7.7)
Neutrophils Relative %: 78 %
PLATELETS: 273 10*3/uL (ref 150–400)
RBC: 4.39 MIL/uL (ref 3.87–5.11)
RDW: 15.1 % (ref 11.5–15.5)
WBC: 11.4 10*3/uL — ABNORMAL HIGH (ref 4.0–10.5)

## 2016-10-17 LAB — CBG MONITORING, ED
GLUCOSE-CAPILLARY: 82 mg/dL (ref 65–99)
Glucose-Capillary: 72 mg/dL (ref 65–99)

## 2016-10-17 LAB — TROPONIN I

## 2016-10-17 MED ORDER — DEXTROSE 50 % IV SOLN
12.5000 g | Freq: Once | INTRAVENOUS | Status: AC
  Administered 2016-10-17: 12.5 g via INTRAVENOUS

## 2016-10-17 MED ORDER — DEXTROSE 50 % IV SOLN
INTRAVENOUS | Status: AC
  Filled 2016-10-17: qty 50

## 2016-10-17 MED ORDER — GLUCOSE 40 % PO GEL
ORAL | Status: AC
  Filled 2016-10-17: qty 1

## 2016-10-17 NOTE — ED Provider Notes (Signed)
[facility] DEPT Provider Note   CSN: [id number] Arrival date & time: 10/17/16  1732     History   Chief Complaint Chief Complaint  Patient presents with  . Circulatory Problem   Level V caveat dementia HPI Jill Francis is a 83 y.o. female.Level V caveat dementia history is obtained from patient's niece , Jill Francis who accompanies her. Ms. Jill Francis is patient's healthcare power of attorney and from paperwork company patient. Patient sent here from skilled nursing facility her niece had been out of the country and visited patient today at skilled nursing facility reports did not look well. Looked "lethargic" she also noticed a dark spot patient's Left lower leg where she formally had a cast. She is concerned about poor circulation. Patient is presently more alert than earlier today.at Baseline she speaks in "gibberish"  HPI  Past Medical History:  Diagnosis Date  . Allergic rhinitis, unspecified   . Alzheimer's disease 08/19/2012  . Anemia in other chronic diseases classified elsewhere   . Asthma   . Breast cancer ([place])   . Cancer [facility])    Skin cancer  . Depression   . Diabetes mellitus   . Hyperlipidemia   . Hypocalcemia   . Ovarian cancer ([place])   . Personal history of malignant neoplasm of ovary   . Type II or unspecified type diabetes mellitus without mention of complication, uncontrolled 08/19/2012    Patient Active Problem List   Diagnosis Date Noted  . Breast cancer ([place])   . Osteoporosis 06/23/2015  . Bradycardia 09/18/2014  . Prolonged QT interval 09/18/2014  . Hypocalcemia 05/27/2014  . FTT (failure to thrive) in adult 05/27/2014  . Dysphagia, oral phase 04/26/2014  . CKD (chronic kidney disease) stage 2, GFR 60-89 ml/min 04/26/2014  . Type 2 diabetes mellitus without complication ([place]) 99/37/1696  . Hyperlipidemia   . Chronic renal disease, stage 2, mildly decreased glomerular filtration rate between 60-89 mL/min/1.73 square meter 10/14/2013  . Cough  06/29/2013  . Hypoglycemia 01/16/2013  . Psychosis 09/20/2012  . Diabetes mellitus type 2, uncontrolled, without complications ([place]) 78/93/8101  . Dementia with behavioral disturbance 08/19/2012  . Iron deficiency anemia 08/19/2012  . Depression 08/19/2012  . Malignant neoplasm of female breast ([place]) 06/30/2008  . ADENOCARCINOMA, OVARY 06/29/2008  . Anxiety state 01/16/2007    Past Surgical History:  Procedure Laterality Date  . BACK SURGERY    . BREAST SURGERY  right partial mastectomy  06/2003  . KNEE SURGERY    . SKIN CANCER EXCISION      OB History    No data available       Home Medications    Prior to Admission medications   Medication Sig Start Date End Date Taking? Authorizing Provider  acetaminophen (TYLENOL) 500 MG tablet Take 1,000 mg by mouth 3 (three) times daily.    [provider]  calcium-vitamin D (OSCAL WITH D) 500-200 MG-UNIT per tablet Take 1 tablet by mouth 3 (three) times daily with meals.     [provider]  Cholecalciferol (VITAMIN D3) 50000 units CAPS Take 1 capsule by mouth every 30 (thirty) days.    [provider]  donepezil (ARICEPT) 10 MG tablet Take 10 mg by mouth at bedtime. For dementia 10/28/10   [provider]  ferrous sulfate 220 (44 Fe) MG/5ML solution Take 220 mg by mouth daily. 7.5 ml    [provider]  Insulin Glargine (TOUJEO SOLOSTAR) 300 UNIT/ML SOPN Inject 10 Units into the skin at bedtime.  [provider]  memantine (NAMENDA) 10 MG tablet Take 10 mg by mouth 2 (two) times daily. For dementia    [provider]  sitaGLIPtin (JANUVIA) 100 MG tablet Take 100 mg by mouth daily.    [provider]  traMADol (ULTRAM) 50 MG tablet Take 50 mg by mouth 2 (two) times daily.    [provider]  UNABLE TO FIND Med Name: Med Pass  Give 90 ml nsa with medication three times a day    [provider]    Family History Family History  Problem Relation  Age of Onset  . Pancreatic cancer Neg Hx     Social History Social History  Substance Use Topics  . Smoking status: Former Research scientist (life sciences)  . Smokeless tobacco: Never Used  . Alcohol use No     Allergies   Gabapentin; Iodine; and Sulfonamide derivatives   Review of Systems Review of Systems  Unable to perform ROS: Dementia  Allergic/Immunologic: Positive for immunocompromised state.       Diabetic     Physical Exam Updated Vital Signs BP 109/89 (BP Location: Right Arm)   Pulse 76   Temp 98.3 F (36.8 C) (Oral)   Resp 19   SpO2 96%   Physical Exam  Constitutional:  Chronically ill-appearing  HENT:  Head: Normocephalic and atraumatic.  Eyes: Pupils are equal, round, and reactive to light. Conjunctivae are normal.  Neck: Neck supple. No tracheal deviation present. No thyromegaly present.  Cardiovascular: Normal rate and regular rhythm.   No murmur heard. Pulmonary/Chest: Effort normal and breath sounds normal.  Abdominal: Soft. Bowel sounds are normal. She exhibits no distension. There is no tenderness.  Musculoskeletal: Normal range of motion. She exhibits no edema or tenderness.  [employer] spine is nontender. Pelvis Francis Left lower extremity there is well demarcated purplish area overlying medial and lateral lower leg approximately distal one fourth. Good capillary refill. DP and PT pulses are absent bilaterally. There is no temperature differential between her feet. Femoral pulses 2+ bilaterally. Popliteal pulses 1+ bilaterally renal pulses 2+ bilaterally  Neurological: She is alert. Coordination normal.  Speech is unclear. Moves all extremities.  nerves II through XII grossly intact  Skin: Skin is warm and dry. No rash noted.  Psychiatric: She has a normal mood and affect.  Nursing note and vitals reviewed.    ED Treatments / Results  Labs (all labs ordered are listed, but only abnormal results are displayed) Labs Reviewed - No data to display  EKG  EKG  Interpretation  Date/Time:  Friday October 17 2016 21:17:20 EDT Ventricular Rate:  67 PR Interval:    QRS Duration: 86 QT Interval:  410 QTC Calculation: 433 R Axis:   40 Text Interpretation:  Sinus rhythm Ventricular premature complex Low voltage, precordial leads SINCE LAST TRACING HEART RATE HAS INCREASED Confirmed by Orlie Dakin [phone]) on 10/17/2016 11:30:01 PM       Radiology No results found.  Procedures Procedures (including critical care time)  Medications Ordered in ED Medications - No data to display Results for orders placed or performed during the hospital encounter of 10/17/16  Comprehensive metabolic panel  Result Value Ref Range   Sodium 145 135 - 145 mmol/L   Potassium 3.9 3.5 - 5.1 mmol/L   Chloride 107 101 - 111 mmol/L   CO2 30 22 - 32 mmol/L   Glucose, Bld 53 (L) 65 - 99 mg/dL   BUN 18 6 - 20 mg/dL   Creatinine, Ser 0.85 0.44 -  1.00 mg/dL   Calcium 8.7 (L) 8.9 - 10.3 mg/dL   Total Protein 6.5 6.5 - 8.1 g/dL   Albumin 2.8 (L) 3.5 - 5.0 g/dL   AST 43 (H) 15 - 41 U/L   ALT 21 14 - 54 U/L   Alkaline Phosphatase 58 38 - 126 U/L   Total Bilirubin 0.4 0.3 - 1.2 mg/dL   GFR calc non Af Amer >60 >60 mL/min   GFR calc Af Amer >60 >60 mL/min   Anion gap 8 5 - 15  CBC with Differential/Platelet  Result Value Ref Range   WBC 11.4 (H) 4.0 - 10.5 K/uL   RBC 4.39 3.87 - 5.11 MIL/uL   Hemoglobin 12.8 12.0 - 15.0 g/dL   HCT 39.9 36.0 - 46.0 %   MCV 90.9 78.0 - 100.0 fL   MCH 29.2 26.0 - 34.0 pg   MCHC 32.1 30.0 - 36.0 g/dL   RDW 15.1 11.5 - 15.5 %   Platelets 273 150 - 400 K/uL   Neutrophils Relative % 78 %   Neutro Abs 8.9 (H) 1.7 - 7.7 K/uL   Lymphocytes Relative 15 %   Lymphs Abs 1.7 0.7 - 4.0 K/uL   Monocytes Relative 7 %   Monocytes Absolute 0.8 0.1 - 1.0 K/uL   Eosinophils Relative 0 %   Eosinophils Absolute 0.0 0.0 - 0.7 K/uL   Basophils Relative 0 %   Basophils Absolute 0.0 0.0 - 0.1 K/uL  Urinalysis, Routine w reflex microscopic  Result  Value Ref Range   Color, Urine YELLOW YELLOW   APPearance CLOUDY (A) CLEAR   Specific Gravity, Urine 1.026 1.005 - 1.030   pH 5.0 5.0 - 8.0   Glucose, UA NEGATIVE NEGATIVE mg/dL   Hgb urine dipstick MODERATE (A) NEGATIVE   Bilirubin Urine NEGATIVE NEGATIVE   Ketones, ur NEGATIVE NEGATIVE mg/dL   Protein, ur 100 (A) NEGATIVE mg/dL   Nitrite NEGATIVE NEGATIVE   Leukocytes, UA TRACE (A) NEGATIVE   RBC / HPF 0-5 0 - 5 RBC/hpf   WBC, UA 0-5 0 - 5 WBC/hpf   Bacteria, UA RARE (A) NONE SEEN   Squamous Epithelial / LPF 0-5 (A) NONE SEEN   Mucous PRESENT   Troponin I  Result Value Ref Range   Troponin I <0.03 <0.03 ng/mL  CBG monitoring, ED  Result Value Ref Range   Glucose-Capillary 82 65 - 99 mg/dL  CBG monitoring, ED  Result Value Ref Range   Glucose-Capillary 72 65 - 99 mg/dL  CBG monitoring, ED  Result Value Ref Range   Glucose-Capillary 136 (H) 65 - 99 mg/dL  X-rays viewed by me Dg Tibia/fibula Left  Result Date: 10/17/2016 CLINICAL DATA:  Pt arrives [facility] from [facility], staff reported pt had cast removed today and has "black" spot on leg where cast was,staff also concerned for circulatory issues in leg. EMS reported pts leg warm and cap refill present. Purple/black bruising on lower left medial leg. History of fracture of the left ankle. EXAM: LEFT TIBIA AND FIBULA - 2 VIEW COMPARISON:  Plain film of the left knee dated 05/18/2009. FINDINGS: Left knee arthroplasty hardware appears intact and appropriately positioned. No evidence of loosening or other complicating feature. Slight deformities of the distal tibia and distal fibula, compatible with the given history of fracture, subacute appearance with evidence of healing at each fracture site. No acute or suspicious osseous lesion. Adjacent soft tissues are unremarkable. IMPRESSION: 1. No acute findings. No acute or suspicious osseous lesion. Overlying  soft tissues are unremarkable. 2. Slight deformities of the distal tibia and  fibula, compatible with the given history of fracture, subacute appearance with evidence of healing. Electronically Signed   By: Franki Cabot M.D.   On: 10/17/2016 20:58   Dg Chest Port 1 View  Result Date: 10/17/2016 CLINICAL DATA:  Pt arrives via [facility] from [facility], staff reported pt had cast removed today and has "black" spot on leg where cast was,staff also concerned for circulatory issues in leg. EMS reported pts leg warm and cap refill present. Purple/black bruising on lower left medial leg. History of fracture of the left ankle. EXAM: PORTABLE CHEST 1 VIEW COMPARISON:  Chest x-ray dated 09/09/2014. FINDINGS: Mild cardiomegaly is Francis. Overall cardiomediastinal silhouette is Francis. Lungs are clear. No pleural effusion. Osseous structures about the chest are unremarkable. IMPRESSION: No active disease. No evidence of pneumonia or pulmonary edema. Francis cardiomegaly. Electronically Signed   By: Franki Cabot M.D.   On: 10/17/2016 20:58    Initial Impression / Assessment and Plan / ED Course  I have reviewed the triage vital signs and the nursing notes.  Pertinent labs & imaging results that were available during my care of the patient were reviewed by me and considered in my medical decision making (see chart for details).   peripheral vascular disease poor circulation legs felt to be long-standing and chronic. I spoke with Dr.Dickson  Further history fromAlexa Ametume, LPN Y telephone patient has had somewhat less oral intake since 09/11/2006 with eating 25-75% of her allotted meals. She's had no fever and no vomiting at least since 09/10/2016 Patient noted behind mildly hypoglycemic. She did not eat dinner tonight. She was given a meal as well as intravenous D50. At 12:10 AM she looks well and at baseline to Ms. Jill Francis I spoke with Dr.Aluicio from orthopedics on call for Dr.Noris patient to be seen in the office sooner than scheduled appointment which is scheduled approximate 3 weeks  from now if desired by patient or family.   Suggest CBGs every 6 hours if blood sugar less than 80, feed patient and recheck in 30 minutes if blood sugar remains low return to the emergency department  Fi nal Clinical Impressions(s) / ED Diagnoses   diagnosis #1 healing fracture of left ankle with bruising.  #2 peripheral vascular disease  #3 hypoglycemia  Final diagnoses:  None    New Prescriptions New Prescriptions   No medications on file     Orlie Dakin, MD 10/18/16 [phone]

## 2016-10-17 NOTE — ED Notes (Signed)
Family at bedside. 

## 2016-10-17 NOTE — ED Notes (Signed)
Dr. Winfred Leeds made aware that patient has not been signed up for by a provider yet.

## 2016-10-17 NOTE — ED Notes (Signed)
Delay in lab draw,  edp at bedside. 

## 2016-10-17 NOTE — ED Triage Notes (Signed)
Pt arrives via [facility] from [facility], staff reported pt had cast removed today and has "black" spot on leg where cast was,staff also concerned for circulatory issues in leg. EMS reported pts leg warm and cap refill present.

## 2016-10-18 DIAGNOSIS — S8292XD Unspecified fracture of left lower leg, subsequent encounter for closed fracture with routine healing: Secondary | ICD-10-CM | POA: Diagnosis not present

## 2016-10-18 LAB — CBG MONITORING, ED: Glucose-Capillary: 136 mg/dL — ABNORMAL HIGH (ref 65–99)

## 2016-10-18 NOTE — ED Notes (Signed)
Report given to Elbia, EMT -PTAR

## 2016-10-18 NOTE — Discharge Instructions (Signed)
Jill Francis can wear the soft cast as needed for comfort.Call D. Dickson's office to arrange for her to get checked within the next 3 weeks. If you wish for her to be seen by Dr. Veverly Fells before her next scheduled appointment call his office on Monday, 10/21/2006. Her blood sugar tonight was low as result of having missed a meal. Check her blood sugar every 6 hours for the next 2 days starting with fasting blood sugar before immediately before breakfast. The blood sugar less than 80, feed her immediately. If blood sugar remains less than 80 thirty minutes after feeding her, call her physician for further instructions or have her return to the emergency department.

## 2016-10-20 ENCOUNTER — Telehealth: Payer: Self-pay | Admitting: Vascular Surgery

## 2016-10-20 NOTE — Telephone Encounter (Signed)
Sched appt 11/05/16 at 12:15. Lm on daughter's #, spoke to [place] at [facility].

## 2016-10-20 NOTE — Telephone Encounter (Signed)
FW: 3 office visits  Received: 2 days ago  Message Contents  McChesney, Tania Ade, RN  P [facility] Admin Pool      Previous Messages    ----- Message -----  From: Angelia Mould, MD  Sent: 10/18/2016  6:25 AM  To: [facility] Charge Pool  Subject: 3 office visits                  3 patients will be calling to set up office visits:   Jill Francis ([birth year])  This patient [facility]. He has a history of DVT and venous disease and had bilateral lower extremity swelling with some bluish discoloration of his toes. He was on Eliquis. He had good Doppler signals in his feet. This sounds like a chronic venous disease patient. He did not want to wait until I came to [facility] emergency department to see him and preferred to be seen as an outpatient. This patient can be seen in 2-3 weeks.   Jill Francis ([birth year])  This patient is an 83 year old gentleman who was in the emergency department in [facility]. it sounds like the patient has stable bilateral lower extremity claudication which is chronic. He had Doppler signals in both feet and needs to be seen in 2-3 weeks.   Jill Francis ([birth year])  This is a elderly cachectic woman with dementia who had broken her ankle in May and had some ecchymosis in her foot. She had presented to the emergency Department with lethargy but was not admitted. She had Doppler signals in both feet and evidence of chronic peripheral vascular disease. She also should be seen in 2-3 weeks.  Thanks  CD

## 2016-10-22 ENCOUNTER — Other Ambulatory Visit: Payer: Self-pay | Admitting: Vascular Surgery

## 2016-10-22 ENCOUNTER — Encounter: Payer: Self-pay | Admitting: Vascular Surgery

## 2016-10-22 ENCOUNTER — Other Ambulatory Visit: Payer: Self-pay | Admitting: *Deleted

## 2016-10-22 ENCOUNTER — Ambulatory Visit (HOSPITAL_COMMUNITY)
Admission: RE | Admit: 2016-10-22 | Discharge: 2016-10-22 | Disposition: A | Discharge status: Home / Self Care | Payer: Medicare Other | Source: Ambulatory Visit | Attending: Vascular Surgery | Admitting: Vascular Surgery

## 2016-10-22 ENCOUNTER — Ambulatory Visit (INDEPENDENT_AMBULATORY_CARE_PROVIDER_SITE_OTHER): Payer: Medicare Other | Admitting: Vascular Surgery

## 2016-10-22 ENCOUNTER — Encounter: Payer: Self-pay | Admitting: Family

## 2016-10-22 VITALS — BP 107/64 | HR 86 | Temp 98.1°F | Resp 20 | Ht 64.0 in | Wt 160.0 lb

## 2016-10-22 DIAGNOSIS — L97909 Non-pressure chronic ulcer of unspecified part of unspecified lower leg with unspecified severity: Secondary | ICD-10-CM

## 2016-10-22 DIAGNOSIS — I739 Peripheral vascular disease, unspecified: Secondary | ICD-10-CM

## 2016-10-22 DIAGNOSIS — I70299 Other atherosclerosis of native arteries of extremities, unspecified extremity: Secondary | ICD-10-CM

## 2016-10-22 NOTE — Progress Notes (Signed)
Patient name: Jill Francis MRN: [id number] DOB: 1927-10-18 Sex: female   REASON FOR CONSULT:    Blisters left leg with peripheral vascular disease. The consult is requested by her primary care physician.  HPI:   Jill Francis is a pleasant 83 y.o. female,  Who has a history of severe dementia and Alzheimer's disease and who has been nonambulatory for 2-3 years. She fractured her left leg in May and was in a cast for 4 weeks. This was taken off and then she had another cast placed for another 4 weeks. When this one was removed it was noted that there were some blisters on the left leg. She is sent for vascular consultation.  Of note, the history is obtained from her daughter. The patient has severe dementia and no meaningful history can be obtained from the patient. It does not sound like she has rest pain.  Past Medical History:  Diagnosis Date  . Allergic rhinitis, unspecified   . Alzheimer's disease 08/19/2012  . Anemia in other chronic diseases classified elsewhere   . Asthma   . Breast cancer ([place])   . Cancer [facility])    Skin cancer  . Depression   . Diabetes mellitus   . Hyperlipidemia   . Hypocalcemia   . Ovarian cancer ([place])   . Personal history of malignant neoplasm of ovary   . Type II or unspecified type diabetes mellitus without mention of complication, uncontrolled 08/19/2012    Family History  Problem Relation Age of Onset  . Pancreatic cancer Neg Hx     SOCIAL HISTORY: She is not a smoker. Social History   Social History  . Marital status: Divorced    Spouse name: N/A  . Number of children: 0  . Years of education: N/A   Occupational History  . [employer]    Social History Main Topics  . Smoking status: Former Research scientist (life sciences)  . Smokeless tobacco: Never Used  . Alcohol use No  . Drug use: No  . Sexual activity: Not on file   Other Topics Concern  . Not on file   Social History Narrative   Admitted to [facility] 05/22/09   Divorced   Former smoker   Alcohol none   DNR       Allergies  Allergen Reactions  . Gabapentin     Per mar  . Iodine     Per mar  . Sulfonamide Derivatives     Per mar    Current Outpatient Prescriptions  Medication Sig Dispense Refill  . acetaminophen (TYLENOL) 500 MG tablet Take 1,000 mg by mouth 3 (three) times daily.    . calcium-vitamin D (OSCAL WITH D) 500-200 MG-UNIT per tablet Take 1 tablet by mouth 3 (three) times daily with meals.     . Cholecalciferol (VITAMIN D3) 50000 units CAPS Take 50,000 Units by mouth every 30 (thirty) days.     Marland Kitchen donepezil (ARICEPT) 10 MG tablet Take 10 mg by mouth at bedtime. For dementia    . ferrous sulfate 220 (44 Fe) MG/5ML solution Take 330 mg by mouth daily. 7.5 ml - 330 mg    . Insulin Glargine (TOUJEO SOLOSTAR) 300 UNIT/ML SOPN Inject 10 Units into the skin at bedtime.     . memantine (NAMENDA) 10 MG tablet Take 10 mg by mouth 2 (two) times daily. For dementia    . Nutritional Supplements (NUTRITIONAL SUPPLEMENT PO) Take 120 mLs by mouth 2 (two) times daily. Med  Pass 2.0    . sitaGLIPtin (JANUVIA) 100 MG tablet Take 100 mg by mouth at bedtime.     . traMADol (ULTRAM) 50 MG tablet Take 50 mg by mouth 2 (two) times daily.     No current facility-administered medications for this visit.     REVIEW OF SYSTEMS: Cannot be obtained.   PHYSICAL EXAM:   There were no vitals filed for this visit. Epic was down during this visit. Vitals were documented as follows. Blood pressure 107/64. Temperature 98.1. Heart rate 86. Oxygen saturation could not be obtained.  GENERAL: The patient is a markedly debilitated female, in no acute distress. The vital signs are documented above. CARDIAC: There is a regular rate and rhythm.  VASCULAR: I do not detect carotid bruits. I cannot palpate femoral pulses, popliteal pulses, or pedal pulses. PULMONARY: There is good air exchange bilaterally without wheezing or rales. ABDOMEN: Soft and non-tender with normal pitched  bowel sounds.  MUSCULOSKELETAL: There are no major deformities or cyanosis. NEUROLOGIC: No focal weakness or paresthesias are detected. SKIN: She has blisters involving the anterior lateral and posterior aspects of her left leg. The one on the posterior aspect of her leg adjacent to the Achilles had not yet ruptured and therefore I opened this to drain fluid to prevent infection. PSYCHIATRIC: She has significant dementia.  DATA:    ARTERIAL DOPPLER STUDY: I have independently interpreted her arterial Doppler study today.  On the left side, which is the side of concern, there is a monophasic posterior tibial signal. Dorsalis pedis signal could not be obtained. The arteries were not compressible and toe pressure on the left was 11 mmHg.  On the right side, there is no posterior tibial signal. There is a monophasic dorsalis pedis signal. ABI on the right is 48%. Toe pressure on the right is 33 mmHg.  MEDICAL ISSUES:   MULTILEVEL ARTERIAL OCCLUSIVE DISEASE: This is a severely debilitated 83 year old with Alzheimer's dementia who is nonambulatory. She is clearly not a candidate for any type of revascularization. She has multilevel arterial occlusive disease. Hopefully the wounds are simply superficial wounds and blisters and there is no deep wound. I have instructed the nursing facility to keep dry dressings on her left leg and to prevent pressure sores by floating the heels. If the wounds are more extensive and cannot be appreciated because of the blisters then she would not be a candidate for revascularization and if she developed evidence of infection her options would be palliative care versus a left above-the-knee amputation. Of note she has had a previous left total knee replacement.  [name] [facility] [phone]

## 2016-10-23 ENCOUNTER — Encounter: Payer: Self-pay | Admitting: Vascular Surgery

## 2016-10-23 NOTE — Addendum Note (Signed)
Addended by: Lianne Cure A on: 10/23/2016 02:33 PM   Modules accepted: Orders

## 2016-11-05 ENCOUNTER — Encounter: Payer: Medicare Other | Admitting: Vascular Surgery

## 2016-12-10 ENCOUNTER — Ambulatory Visit (INDEPENDENT_AMBULATORY_CARE_PROVIDER_SITE_OTHER): Payer: Medicare Other | Admitting: Vascular Surgery

## 2016-12-10 ENCOUNTER — Encounter: Payer: Self-pay | Admitting: Vascular Surgery

## 2016-12-10 VITALS — BP 125/78 | HR 86 | Temp 97.7°F | Ht 64.0 in

## 2016-12-10 DIAGNOSIS — L97909 Non-pressure chronic ulcer of unspecified part of unspecified lower leg with unspecified severity: Secondary | ICD-10-CM

## 2016-12-10 DIAGNOSIS — I70299 Other atherosclerosis of native arteries of extremities, unspecified extremity: Secondary | ICD-10-CM

## 2016-12-10 NOTE — Progress Notes (Signed)
Patient name: Jill Francis MRN: [id number] DOB: 1927-12-07 Sex: female  REASON FOR VISIT:    Follow up of left leg wounds and peripheral vascular disease.  HPI:   Jill Francis is a pleasant 83 y.o. female who I saw in consultation on 10/22/2016. The patient has a history of severe dementia and Alzheimer's disease and has been nonambulatory for 2-3 years. She fractured her left leg in May and was in a cast for 4 weeks. When this was taken off another cast was placed for another 4 weeks. When the second one was removed there were some blisters on her left leg which have been slow to heal. When I saw her in consultation on 10/22/2016 felt she was clearly not a candidate for any type of revascularization. A start her exam she had evidence of multilevel arterial occlusive disease. Toe pressure on the left was only 11 mmHg. She had monophasic Doppler signals in the posterior tibial artery only on the left with no dorsalis pedis signal. My feeling was, that if the wounds progressed, the options would be palliative care versus a left above-the-knee amputation. Of note she's previously had a left total knee replacement.  She is here today with her niece. It is difficult to determine if she's having any pain. She has severe dementia with Alzheimer's disease. The niece is unaware of any fever or significant drainage from the wounds.  Current Outpatient Prescriptions  Medication Sig Dispense Refill  . acetaminophen (TYLENOL) 500 MG tablet Take 1,000 mg by mouth 3 (three) times daily.    . calcium-vitamin D (OSCAL WITH D) 500-200 MG-UNIT per tablet Take 1 tablet by mouth 3 (three) times daily with meals.     . Cholecalciferol (VITAMIN D3) 50000 units CAPS Take 50,000 Units by mouth every 30 (thirty) days.     Marland Kitchen donepezil (ARICEPT) 10 MG tablet Take 10 mg by mouth at bedtime. For dementia    . ferrous sulfate 220 (44 Fe) MG/5ML solution Take 330 mg by mouth daily. 7.5 ml - 330 mg    . Insulin Glargine  (TOUJEO SOLOSTAR) 300 UNIT/ML SOPN Inject 10 Units into the skin at bedtime.     . memantine (NAMENDA) 10 MG tablet Take 10 mg by mouth 2 (two) times daily. For dementia    . Nutritional Supplements (NUTRITIONAL SUPPLEMENT PO) Take 120 mLs by mouth 2 (two) times daily. Med Pass 2.0    . sitaGLIPtin (JANUVIA) 100 MG tablet Take 100 mg by mouth at bedtime.     . traMADol (ULTRAM) 50 MG tablet Take 50 mg by mouth 2 (two) times daily.     No current facility-administered medications for this visit.     REVIEW OF SYSTEMS:  [X]  denotes positive finding, [ ]  denotes negative finding Cardiac  Comments:  Chest pain or chest pressure:    Shortness of breath upon exertion:    Short of breath when lying flat:    Irregular heart rhythm:    Constitutional    Fever or chills:     PHYSICAL EXAM:   Vitals:   12/10/16 1551  BP: 125/78  Pulse: 86  Temp: 97.7 F (36.5 C)  TempSrc: Oral  SpO2: 99%  Height: 5\' 4"  (1.626 m)    GENERAL: The patient is a well-nourished female, in no acute distress. The vital signs are documented above. CARDIOVASCULAR: There is a regular rate and rhythm. PULMONARY: There is good air exchange bilaterally without wheezing or rales. She has dry gangrene involving  the entire medial aspect of her distal left calf and ankle extending onto the foot. There is no significant erythema or drainage currently.  DATA:   I reviewed the previous knee x-ray on the left which shows that there should not be any metal that would interfere with an above-the-knee amputation.  MEDICAL ISSUES:   GANGRENE OF THE LEFT LEG WITH MULTILEVEL ARTERIAL OCCLUSIVE DISEASE: The patient is clearly not a candidate for revascularization. I have again discussed with the family that the options would be palliative care versus an above-the-knee amputation. Given her age and debilitated state certainly any surgery would be associated with significant risk. I have explained that if they decide to go with  palliative care then certainly this could become infected and cause her demise. I've instructed the nursing home to do dressing changes with dry gauze and to soak the foot daily and lukewarm Dial soap soap to keep this dry. The family will discuss this and call if they decide to proceed with left above-the-knee amputation. If they do agree think she would need to be admitted by the hospitalists after surgery. She has multiple medical conditions including diabetes, hyperlipidemia, anemia of chronic disease, Alzheimer's disease, and hypocalcemia.  [name] [facility] [phone]

## 2016-12-12 ENCOUNTER — Other Ambulatory Visit: Payer: Self-pay | Admitting: *Deleted

## 2016-12-15 ENCOUNTER — Telehealth: Payer: Self-pay | Admitting: *Deleted

## 2016-12-15 ENCOUNTER — Encounter: Payer: Self-pay | Admitting: *Deleted

## 2016-12-15 NOTE — Telephone Encounter (Signed)
Spoke with niece Jill Francis who ask I set up transportation for Jill Francis. Transportation arraged with Almyra Free at [facility] and spoke with nurse "Ola" that se should be getting a call from [facility] pre-surgical testing about pre-op instructions.Letter faxed to [facility].

## 2016-12-16 ENCOUNTER — Telehealth: Payer: Self-pay | Admitting: *Deleted

## 2016-12-16 NOTE — Pre-Procedure Instructions (Signed)
    Jill Francis  12/16/2016     [name]'s procedure is scheduled on Thursday, December 18, 2016 at 9:30 AM.   Report to [place] Entrance "A" Admitting Office at 7:30 AM.   Call this number if you have problems the morning of surgery: [phone]    Remember:  Patient is not to eat food or drink liquids after midnight Wednesday.  Have patient take these medicines the morning of surgery with A SIP OF WATER: Memantine (Namenda), Tramadol   Diabetic Medication Instructions   Do not take oral diabetes medicines (pills) the morning of surgery.  THE NIGHT BEFORE SURGERY, give pt 1/2 of her regular dose of Toujeo (give 5 units)    Check patient's blood sugar at least 4 times a day, 2 days before surgery to make sure that they are not too high or low.  Check patient's blood sugar the morning of surgery when patient wakes up and every 2 hours until she leaves for the hospital.  Treat a low blood sugar (less than 70 mg/dL) with 1/2 cup of clear juice (cranberry or apple), 4 glucose tablets, OR glucose gel.  Recheck blood sugar in 15 minutes after treatment (to make sure it is greater than 70 mg/dL).  If blood sugar is not greater than 70 mg/dL on re-check, call [phone] for further instructions.     Do not wear jewelry, make-up or nail polish.  Do not wear lotions, powders, perfumes or deodorant.  Do not shave 48 hours prior to surgery.    Do not bring valuables to the hospital.  [facility] is not responsible for any belongings or valuables.  Contacts, dentures or bridgework may not be worn into surgery.  Leave your suitcase in the car.  After surgery it may be brought to your room.  For patients admitted to the hospital, discharge time will be determined by your treatment team.   Any questions today or tomorrow, please call me, Lilia Pro, RN at [phone]

## 2016-12-16 NOTE — Progress Notes (Signed)
Pt is a resident of [facility]. Spoke with Kenney Houseman, RN for pre-op call. She states pt has severe dementia. Pt's niece, Jeani Hawking will be meeting pt here day of surgery. Pt does not have a cardiac history. Pt is a type 2 diabetic. Nicole Kindred states her last A1C was 7.6 on 12/10/16. She also states that they do not check pt's fasting blood sugar. Only check it at night. Pre-op instructions faxed to [place], LPN at [phone] per request of [facility] RN.

## 2016-12-16 NOTE — Telephone Encounter (Signed)
Called and left message for Baker Janus at [facility] PST. To reach out to nursing home to day to get all pre-op instructions to Nurse at [facility] and number to contact given). General pre-op letter faxed to [place] at [facility]. Left Msg for Niece,Lynn of all of the contacts made re pre-op and transportation arranged.

## 2016-12-18 ENCOUNTER — Inpatient Hospital Stay (HOSPITAL_COMMUNITY): Payer: Medicare Other | Admitting: Anesthesiology

## 2016-12-18 ENCOUNTER — Encounter (HOSPITAL_COMMUNITY)
Admission: RE | Disposition: A | Discharge status: Skilled Nursing Facility | Payer: Self-pay | Source: Home / Self Care | Attending: Vascular Surgery

## 2016-12-18 ENCOUNTER — Inpatient Hospital Stay (HOSPITAL_COMMUNITY)
Admission: RE | Admit: 2016-12-18 | Discharge: 2016-12-23 | DRG: 240 | Disposition: A | Discharge status: Skilled Nursing Facility | Payer: Medicare Other | Attending: Vascular Surgery | Admitting: Vascular Surgery

## 2016-12-18 ENCOUNTER — Encounter (HOSPITAL_COMMUNITY): Payer: Self-pay | Admitting: *Deleted

## 2016-12-18 DIAGNOSIS — Z96652 Presence of left artificial knee joint: Secondary | ICD-10-CM | POA: Diagnosis present

## 2016-12-18 DIAGNOSIS — S80822A Blister (nonthermal), left lower leg, initial encounter: Secondary | ICD-10-CM | POA: Diagnosis not present

## 2016-12-18 DIAGNOSIS — I96 Gangrene, not elsewhere classified: Secondary | ICD-10-CM | POA: Diagnosis present

## 2016-12-18 DIAGNOSIS — Z853 Personal history of malignant neoplasm of breast: Secondary | ICD-10-CM | POA: Diagnosis not present

## 2016-12-18 DIAGNOSIS — J45909 Unspecified asthma, uncomplicated: Secondary | ICD-10-CM | POA: Diagnosis present

## 2016-12-18 DIAGNOSIS — I351 Nonrheumatic aortic (valve) insufficiency: Secondary | ICD-10-CM | POA: Diagnosis not present

## 2016-12-18 DIAGNOSIS — E11649 Type 2 diabetes mellitus with hypoglycemia without coma: Secondary | ICD-10-CM | POA: Diagnosis not present

## 2016-12-18 DIAGNOSIS — E8809 Other disorders of plasma-protein metabolism, not elsewhere classified: Secondary | ICD-10-CM | POA: Diagnosis not present

## 2016-12-18 DIAGNOSIS — Z79891 Long term (current) use of opiate analgesic: Secondary | ICD-10-CM | POA: Diagnosis not present

## 2016-12-18 DIAGNOSIS — Z87891 Personal history of nicotine dependence: Secondary | ICD-10-CM

## 2016-12-18 DIAGNOSIS — X58XXXA Exposure to other specified factors, initial encounter: Secondary | ICD-10-CM | POA: Diagnosis present

## 2016-12-18 DIAGNOSIS — I480 Paroxysmal atrial fibrillation: Secondary | ICD-10-CM | POA: Diagnosis not present

## 2016-12-18 DIAGNOSIS — I083 Combined rheumatic disorders of mitral, aortic and tricuspid valves: Secondary | ICD-10-CM | POA: Diagnosis present

## 2016-12-18 DIAGNOSIS — F0280 Dementia in other diseases classified elsewhere without behavioral disturbance: Secondary | ICD-10-CM | POA: Diagnosis not present

## 2016-12-18 DIAGNOSIS — I481 Persistent atrial fibrillation: Secondary | ICD-10-CM | POA: Diagnosis not present

## 2016-12-18 DIAGNOSIS — E876 Hypokalemia: Secondary | ICD-10-CM

## 2016-12-18 DIAGNOSIS — Z8543 Personal history of malignant neoplasm of ovary: Secondary | ICD-10-CM | POA: Diagnosis not present

## 2016-12-18 DIAGNOSIS — I739 Peripheral vascular disease, unspecified: Secondary | ICD-10-CM | POA: Diagnosis not present

## 2016-12-18 DIAGNOSIS — I771 Stricture of artery: Secondary | ICD-10-CM | POA: Diagnosis present

## 2016-12-18 DIAGNOSIS — I4891 Unspecified atrial fibrillation: Secondary | ICD-10-CM | POA: Diagnosis not present

## 2016-12-18 DIAGNOSIS — E785 Hyperlipidemia, unspecified: Secondary | ICD-10-CM | POA: Diagnosis not present

## 2016-12-18 DIAGNOSIS — D638 Anemia in other chronic diseases classified elsewhere: Secondary | ICD-10-CM | POA: Diagnosis present

## 2016-12-18 DIAGNOSIS — E1152 Type 2 diabetes mellitus with diabetic peripheral angiopathy with gangrene: Principal | ICD-10-CM | POA: Diagnosis present

## 2016-12-18 DIAGNOSIS — Z794 Long term (current) use of insulin: Secondary | ICD-10-CM | POA: Diagnosis not present

## 2016-12-18 DIAGNOSIS — Z79899 Other long term (current) drug therapy: Secondary | ICD-10-CM | POA: Diagnosis not present

## 2016-12-18 DIAGNOSIS — G309 Alzheimer's disease, unspecified: Secondary | ICD-10-CM | POA: Diagnosis not present

## 2016-12-18 DIAGNOSIS — Z85828 Personal history of other malignant neoplasm of skin: Secondary | ICD-10-CM

## 2016-12-18 HISTORY — PX: AMPUTATION: SHX166

## 2016-12-18 LAB — POCT I-STAT, CHEM 8
BUN: 15 mg/dL (ref 6–20)
Calcium, Ion: 0.77 mmol/L — CL (ref 1.15–1.40)
Chloride: 104 mmol/L (ref 101–111)
Creatinine, Ser: 0.6 mg/dL (ref 0.44–1.00)
Glucose, Bld: 184 mg/dL — ABNORMAL HIGH (ref 65–99)
HCT: 33 % — ABNORMAL LOW (ref 36.0–46.0)
Hemoglobin: 11.2 g/dL — ABNORMAL LOW (ref 12.0–15.0)
Potassium: 3.4 mmol/L — ABNORMAL LOW (ref 3.5–5.1)
Sodium: 139 mmol/L (ref 135–145)
TCO2: 26 mmol/L (ref 22–32)

## 2016-12-18 LAB — GLUCOSE, CAPILLARY
GLUCOSE-CAPILLARY: 43 mg/dL — AB (ref 65–99)
Glucose-Capillary: 112 mg/dL — ABNORMAL HIGH (ref 65–99)
Glucose-Capillary: 110 mg/dL — ABNORMAL HIGH (ref 65–99)
Glucose-Capillary: 86 mg/dL (ref 65–99)
Glucose-Capillary: 79 mg/dL (ref 65–99)
Glucose-Capillary: 88 mg/dL (ref 65–99)
Glucose-Capillary: 208 mg/dL — ABNORMAL HIGH (ref 65–99)
Glucose-Capillary: 297 mg/dL — ABNORMAL HIGH (ref 65–99)

## 2016-12-18 LAB — CBC
HCT: 32.2 % — ABNORMAL LOW (ref 36.0–46.0)
HEMATOCRIT: 35.1 % — AB (ref 36.0–46.0)
HEMOGLOBIN: 11.7 g/dL — AB (ref 12.0–15.0)
Hemoglobin: 10.1 g/dL — ABNORMAL LOW (ref 12.0–15.0)
MCH: 29.8 pg (ref 26.0–34.0)
MCH: 27.9 pg (ref 26.0–34.0)
MCHC: 33.3 g/dL (ref 30.0–36.0)
MCHC: 31.4 g/dL (ref 30.0–36.0)
MCV: 89.5 fL (ref 78.0–100.0)
MCV: 89 fL (ref 78.0–100.0)
Platelets: 550 10*3/uL — ABNORMAL HIGH (ref 150–400)
Platelets: 533 10*3/uL — ABNORMAL HIGH (ref 150–400)
RBC: 3.92 MIL/uL (ref 3.87–5.11)
RBC: 3.62 MIL/uL — ABNORMAL LOW (ref 3.87–5.11)
RDW: 15.3 % (ref 11.5–15.5)
RDW: 14.7 % (ref 11.5–15.5)
WBC: 12.6 10*3/uL — ABNORMAL HIGH (ref 4.0–10.5)
WBC: 15.6 10*3/uL — ABNORMAL HIGH (ref 4.0–10.5)

## 2016-12-18 LAB — CREATININE, SERUM
CREATININE: 0.77 mg/dL (ref 0.44–1.00)
GFR calc Af Amer: >60 mL/min (ref >60)

## 2016-12-18 LAB — TYPE AND SCREEN
ABO/RH(D): A POS
Antibody Screen: NEGATIVE

## 2016-12-18 SURGERY — AMPUTATION, ABOVE KNEE
Anesthesia: General | Laterality: Left

## 2016-12-18 MED ORDER — NUTRITIONAL SUPPLEMENT PO LIQD: Freq: Three times a day (TID) | ORAL | Status: DC

## 2016-12-18 MED ORDER — CALCITONIN (SALMON) 200 UNIT/ACT NA SOLN
1.0000 | Freq: Every day | NASAL | Status: DC
  Administered 2016-12-18 – 2016-12-22 (×4): 1 via NASAL
  Filled 2016-12-18 (×3): qty 3.7

## 2016-12-18 MED ORDER — DEXTROSE 50 % IV SOLN
12.5000 g | Freq: Once | INTRAVENOUS | Status: AC
  Administered 2016-12-18: 12.5 g via INTRAVENOUS

## 2016-12-18 MED ORDER — GUAIFENESIN-DM 100-10 MG/5ML PO SYRP: 15.0000 mL | ORAL_SOLUTION | ORAL | Status: DC | PRN

## 2016-12-18 MED ORDER — LACTATED RINGERS IV SOLN: INTRAVENOUS | Status: DC | PRN

## 2016-12-18 MED ORDER — OXYCODONE-ACETAMINOPHEN 5-325 MG PO TABS
1.0000 | ORAL_TABLET | ORAL | Status: DC | PRN
  Administered 2016-12-20: 1 via ORAL
  Administered 2016-12-21 – 2016-12-23 (×3): 2 via ORAL
  Filled 2016-12-18 (×3): qty 2
  Filled 2016-12-18: qty 1

## 2016-12-18 MED ORDER — DEXTROSE 50 % IV SOLN
25.0000 mL | Freq: Once | INTRAVENOUS | Status: AC
  Administered 2016-12-18: 25 mL via INTRAVENOUS

## 2016-12-18 MED ORDER — METOPROLOL TARTRATE 5 MG/5ML IV SOLN: 5.0000 mg | Freq: Four times a day (QID) | INTRAVENOUS | Status: DC | PRN

## 2016-12-18 MED ORDER — FERROUS SULFATE 220 (44 FE) MG/5ML PO ELIX
330.0000 mg | ORAL_SOLUTION | Freq: Every day | ORAL | Status: DC
Start: 2016-12-18 — End: 2016-12-23
  Administered 2016-12-18 – 2016-12-23 (×4): 330 mg via ORAL
  Filled 2016-12-18 (×6): qty 7.5

## 2016-12-18 MED ORDER — DEXAMETHASONE SODIUM PHOSPHATE 10 MG/ML IJ SOLN
INTRAMUSCULAR | Status: AC
  Filled 2016-12-18: qty 1

## 2016-12-18 MED ORDER — CHLORHEXIDINE GLUCONATE 4 % EX LIQD: 60.0000 mL | Freq: Once | CUTANEOUS | Status: DC

## 2016-12-18 MED ORDER — DECUBI-VITE PO CAPS: ORAL_CAPSULE | Freq: Every day | ORAL | Status: DC

## 2016-12-18 MED ORDER — PROTAMINE SULFATE 10 MG/ML IV SOLN
INTRAVENOUS | Status: AC
  Filled 2016-12-18: qty 5

## 2016-12-18 MED ORDER — DONEPEZIL HCL 10 MG PO TABS
10.0000 mg | ORAL_TABLET | Freq: Every day | ORAL | Status: DC
  Administered 2016-12-18 – 2016-12-22 (×5): 10 mg via ORAL
  Filled 2016-12-18 (×5): qty 1

## 2016-12-18 MED ORDER — TRAMADOL HCL 50 MG PO TABS
50.0000 mg | ORAL_TABLET | Freq: Two times a day (BID) | ORAL | Status: DC
  Administered 2016-12-18 – 2016-12-23 (×10): 50 mg via ORAL
  Filled 2016-12-18 (×10): qty 1

## 2016-12-18 MED ORDER — HYDROMORPHONE HCL 1 MG/ML IJ SOLN
0.5000 mg | INTRAMUSCULAR | Status: DC | PRN
  Administered 2016-12-19 (×2): 1 mg via INTRAVENOUS
  Filled 2016-12-18 (×2): qty 1

## 2016-12-18 MED ORDER — DEXTROSE 5 % IV SOLN
1.5000 g | INTRAVENOUS | Status: AC
  Administered 2016-12-18: 1.5 g via INTRAVENOUS
  Filled 2016-12-18: qty 1.5

## 2016-12-18 MED ORDER — ONDANSETRON HCL 4 MG/2ML IJ SOLN: 4.0000 mg | Freq: Once | INTRAMUSCULAR | Status: DC | PRN

## 2016-12-18 MED ORDER — BACITRACIN ZINC 500 UNIT/GM EX OINT
TOPICAL_OINTMENT | CUTANEOUS | Status: AC
  Filled 2016-12-18: qty 28.35

## 2016-12-18 MED ORDER — KCL IN DEXTROSE-NACL 20-5-0.45 MEQ/L-%-% IV SOLN
INTRAVENOUS | Status: DC
  Administered 2016-12-18 – 2016-12-23 (×5): via INTRAVENOUS
  Filled 2016-12-18 (×8): qty 1000

## 2016-12-18 MED ORDER — DEXAMETHASONE SODIUM PHOSPHATE 10 MG/ML IJ SOLN
INTRAMUSCULAR | Status: DC | PRN
  Administered 2016-12-18: 10 mg via INTRAVENOUS

## 2016-12-18 MED ORDER — SODIUM CHLORIDE 0.9 % IV SOLN
INTRAVENOUS | Status: DC
  Administered 2016-12-18 (×2): via INTRAVENOUS

## 2016-12-18 MED ORDER — PROPOFOL 10 MG/ML IV BOLUS
INTRAVENOUS | Status: DC | PRN
  Administered 2016-12-18: 110 mg via INTRAVENOUS

## 2016-12-18 MED ORDER — LIDOCAINE 2% (20 MG/ML) 5 ML SYRINGE
INTRAMUSCULAR | Status: AC
  Filled 2016-12-18: qty 5

## 2016-12-18 MED ORDER — ONDANSETRON HCL 4 MG/2ML IJ SOLN
INTRAMUSCULAR | Status: AC
  Filled 2016-12-18: qty 2

## 2016-12-18 MED ORDER — POTASSIUM CHLORIDE CRYS ER 20 MEQ PO TBCR: 20.0000 meq | EXTENDED_RELEASE_TABLET | Freq: Once | ORAL | Status: DC | PRN

## 2016-12-18 MED ORDER — PANTOPRAZOLE SODIUM 40 MG PO TBEC
40.0000 mg | DELAYED_RELEASE_TABLET | Freq: Every day | ORAL | Status: DC
  Administered 2016-12-18 – 2016-12-23 (×6): 40 mg via ORAL
  Filled 2016-12-18 (×6): qty 1

## 2016-12-18 MED ORDER — BOOST / RESOURCE BREEZE PO LIQD
1.0000 | Freq: Three times a day (TID) | ORAL | Status: DC
  Administered 2016-12-18 – 2016-12-19 (×2): 1 via ORAL

## 2016-12-18 MED ORDER — FENTANYL CITRATE (PF) 100 MCG/2ML IJ SOLN
25.0000 ug | INTRAMUSCULAR | Status: DC | PRN
  Administered 2016-12-18 (×5): 50 ug via INTRAVENOUS

## 2016-12-18 MED ORDER — ROCURONIUM BROMIDE 10 MG/ML (PF) SYRINGE
PREFILLED_SYRINGE | INTRAVENOUS | Status: AC
  Filled 2016-12-18: qty 5

## 2016-12-18 MED ORDER — ROCURONIUM BROMIDE 100 MG/10ML IV SOLN
INTRAVENOUS | Status: DC | PRN
  Administered 2016-12-18: 50 mg via INTRAVENOUS

## 2016-12-18 MED ORDER — LIDOCAINE 2% (20 MG/ML) 5 ML SYRINGE
INTRAMUSCULAR | Status: DC | PRN
  Administered 2016-12-18: 60 mg via INTRAVENOUS

## 2016-12-18 MED ORDER — FENTANYL CITRATE (PF) 100 MCG/2ML IJ SOLN
INTRAMUSCULAR | Status: DC | PRN
  Administered 2016-12-18: 25 ug via INTRAVENOUS
  Administered 2016-12-18: 100 ug via INTRAVENOUS

## 2016-12-18 MED ORDER — METOPROLOL TARTRATE 5 MG/5ML IV SOLN: 2.0000 mg | INTRAVENOUS | Status: DC | PRN

## 2016-12-18 MED ORDER — LINAGLIPTIN 5 MG PO TABS
5.0000 mg | ORAL_TABLET | Freq: Every day | ORAL | Status: DC
  Administered 2016-12-18 – 2016-12-23 (×5): 5 mg via ORAL
  Filled 2016-12-18 (×6): qty 1

## 2016-12-18 MED ORDER — FENTANYL CITRATE (PF) 100 MCG/2ML IJ SOLN
INTRAMUSCULAR | Status: AC
  Administered 2016-12-18: 50 ug via INTRAVENOUS
  Filled 2016-12-18: qty 2

## 2016-12-18 MED ORDER — SUGAMMADEX SODIUM 200 MG/2ML IV SOLN
INTRAVENOUS | Status: DC | PRN
  Administered 2016-12-18: 200 mg via INTRAVENOUS

## 2016-12-18 MED ORDER — CALCIUM CARBONATE-VITAMIN D 500-200 MG-UNIT PO TABS
1.0000 | ORAL_TABLET | Freq: Three times a day (TID) | ORAL | Status: DC
  Administered 2016-12-19 – 2016-12-23 (×8): 1 via ORAL
  Filled 2016-12-18 (×9): qty 1

## 2016-12-18 MED ORDER — ADULT MULTIVITAMIN W/MINERALS CH
1.0000 | ORAL_TABLET | Freq: Every day | ORAL | Status: DC
  Administered 2016-12-18 – 2016-12-23 (×5): 1 via ORAL
  Filled 2016-12-18 (×6): qty 1

## 2016-12-18 MED ORDER — DEXTROSE 50 % IV SOLN
INTRAVENOUS | Status: AC
Start: 2016-12-18 — End: 2016-12-18
  Administered 2016-12-18: 25 mL via INTRAVENOUS
  Filled 2016-12-18: qty 50

## 2016-12-18 MED ORDER — DOCUSATE SODIUM 100 MG PO CAPS
100.0000 mg | ORAL_CAPSULE | Freq: Every day | ORAL | Status: DC
  Administered 2016-12-20 – 2016-12-23 (×4): 100 mg via ORAL
  Filled 2016-12-18 (×5): qty 1

## 2016-12-18 MED ORDER — ONDANSETRON HCL 4 MG/2ML IJ SOLN: 4.0000 mg | Freq: Four times a day (QID) | INTRAMUSCULAR | Status: DC | PRN

## 2016-12-18 MED ORDER — MAGNESIUM SULFATE 2 GM/50ML IV SOLN
2.0000 g | Freq: Once | INTRAVENOUS | Status: DC | PRN
  Filled 2016-12-18: qty 50

## 2016-12-18 MED ORDER — HEPARIN SODIUM (PORCINE) 1000 UNIT/ML IJ SOLN
INTRAMUSCULAR | Status: AC
  Filled 2016-12-18: qty 4

## 2016-12-18 MED ORDER — DEXTROSE 5 % IV SOLN
1.5000 g | Freq: Two times a day (BID) | INTRAVENOUS | Status: AC
  Administered 2016-12-18 – 2016-12-19 (×2): 1.5 g via INTRAVENOUS
  Filled 2016-12-18 (×2): qty 1.5

## 2016-12-18 MED ORDER — 0.9 % SODIUM CHLORIDE (POUR BTL) OPTIME
TOPICAL | Status: DC | PRN
  Administered 2016-12-18: 1000 mL

## 2016-12-18 MED ORDER — PROPOFOL 10 MG/ML IV BOLUS
INTRAVENOUS | Status: AC
  Filled 2016-12-18: qty 20

## 2016-12-18 MED ORDER — ACETAMINOPHEN 500 MG PO TABS
1000.0000 mg | ORAL_TABLET | Freq: Three times a day (TID) | ORAL | Status: DC
  Administered 2016-12-18 – 2016-12-23 (×11): 1000 mg via ORAL
  Filled 2016-12-18 (×13): qty 2

## 2016-12-18 MED ORDER — MEMANTINE HCL 10 MG PO TABS
10.0000 mg | ORAL_TABLET | Freq: Two times a day (BID) | ORAL | Status: DC
  Administered 2016-12-18 – 2016-12-23 (×9): 10 mg via ORAL
  Filled 2016-12-18 (×10): qty 1

## 2016-12-18 MED ORDER — PHENOL 1.4 % MT LIQD: 1.0000 | OROMUCOSAL | Status: DC | PRN

## 2016-12-18 MED ORDER — INSULIN ASPART 100 UNIT/ML ~~LOC~~ SOLN
0.0000 [IU] | Freq: Three times a day (TID) | SUBCUTANEOUS | Status: DC
  Administered 2016-12-19: 5 [IU] via SUBCUTANEOUS

## 2016-12-18 MED ORDER — ENOXAPARIN SODIUM 40 MG/0.4ML ~~LOC~~ SOLN
40.0000 mg | SUBCUTANEOUS | Status: DC
Start: 2016-12-19 — End: 2016-12-23
  Administered 2016-12-19 – 2016-12-23 (×5): 40 mg via SUBCUTANEOUS
  Filled 2016-12-18 (×5): qty 0.4

## 2016-12-18 MED ORDER — ONDANSETRON HCL 4 MG/2ML IJ SOLN
INTRAMUSCULAR | Status: DC | PRN
Start: 2016-12-18 — End: 2016-12-18
  Administered 2016-12-18: 4 mg via INTRAVENOUS

## 2016-12-18 MED ORDER — ACETAMINOPHEN 650 MG RE SUPP: 325.0000 mg | RECTAL | Status: DC | PRN

## 2016-12-18 MED ORDER — VITAMIN D (ERGOCALCIFEROL) 1.25 MG (50000 UNIT) PO CAPS: 50000.0000 [IU] | ORAL_CAPSULE | ORAL | Status: DC

## 2016-12-18 MED ORDER — BACITRACIN ZINC 500 UNIT/GM EX OINT
TOPICAL_OINTMENT | CUTANEOUS | Status: DC | PRN
Start: 2016-12-18 — End: 2016-12-18
  Administered 2016-12-18: 1 via TOPICAL

## 2016-12-18 MED ORDER — PHENYLEPHRINE HCL 10 MG/ML IJ SOLN
INTRAVENOUS | Status: DC | PRN
  Administered 2016-12-18: 40 ug/min via INTRAVENOUS

## 2016-12-18 MED ORDER — INSULIN GLARGINE 100 UNIT/ML ~~LOC~~ SOLN
10.0000 [IU] | Freq: Every day | SUBCUTANEOUS | Status: DC
  Administered 2016-12-18 – 2016-12-22 (×5): 10 [IU] via SUBCUTANEOUS
  Filled 2016-12-18 (×7): qty 0.1

## 2016-12-18 MED ORDER — FENTANYL CITRATE (PF) 250 MCG/5ML IJ SOLN
INTRAMUSCULAR | Status: AC
  Filled 2016-12-18: qty 5

## 2016-12-18 MED ORDER — SUGAMMADEX SODIUM 200 MG/2ML IV SOLN
INTRAVENOUS | Status: AC
  Filled 2016-12-18: qty 2

## 2016-12-18 MED ORDER — ACETAMINOPHEN 325 MG PO TABS: 325.0000 mg | ORAL_TABLET | ORAL | Status: DC | PRN

## 2016-12-18 MED ORDER — HYDRALAZINE HCL 20 MG/ML IJ SOLN: 5.0000 mg | INTRAMUSCULAR | Status: DC | PRN

## 2016-12-18 MED ORDER — LABETALOL HCL 5 MG/ML IV SOLN
10.0000 mg | INTRAVENOUS | Status: DC | PRN
  Filled 2016-12-18: qty 4

## 2016-12-18 MED ORDER — ALUM & MAG HYDROXIDE-SIMETH 200-200-20 MG/5ML PO SUSP: 15.0000 mL | ORAL | Status: DC | PRN

## 2016-12-18 MED ORDER — FENTANYL CITRATE (PF) 100 MCG/2ML IJ SOLN: 25.0000 ug | INTRAMUSCULAR | Status: DC | PRN

## 2016-12-18 MED ORDER — ESMOLOL HCL 100 MG/10ML IV SOLN
INTRAVENOUS | Status: DC | PRN
  Administered 2016-12-18 (×2): 10 mg via INTRAVENOUS

## 2016-12-18 SURGICAL SUPPLY — 60 items
BANDAGE ACE 4X5 VEL STRL LF (GAUZE/BANDAGES/DRESSINGS) ×3 IMPLANT
BANDAGE ACE 6X5 VEL STRL LF (GAUZE/BANDAGES/DRESSINGS) IMPLANT
BANDAGE ESMARK 6X9 LF (GAUZE/BANDAGES/DRESSINGS) ×1 IMPLANT
BLADE SAW RECIP 87.9 MT (BLADE) ×3 IMPLANT
BNDG COHESIVE 6X5 TAN STRL LF (GAUZE/BANDAGES/DRESSINGS) ×3 IMPLANT
BNDG ESMARK 6X9 LF (GAUZE/BANDAGES/DRESSINGS) ×3
BNDG GAUZE ELAST 4 BULKY (GAUZE/BANDAGES/DRESSINGS) ×3 IMPLANT
CANISTER SUCT 3000ML PPV (MISCELLANEOUS) ×3 IMPLANT
CLIP TI MEDIUM 6 (CLIP) ×3 IMPLANT
COVER SURGICAL LIGHT HANDLE (MISCELLANEOUS) ×3 IMPLANT
CUFF TOURNIQUET SINGLE 18IN (TOURNIQUET CUFF) IMPLANT
CUFF TOURNIQUET SINGLE 24IN (TOURNIQUET CUFF) ×3 IMPLANT
CUFF TOURNIQUET SINGLE 34IN LL (TOURNIQUET CUFF) IMPLANT
CUFF TOURNIQUET SINGLE 44IN (TOURNIQUET CUFF) IMPLANT
DRAIN CHANNEL 19F RND (DRAIN) IMPLANT
DRAPE HALF SHEET 40X57 (DRAPES) ×3 IMPLANT
DRAPE ORTHO SPLIT 77X108 STRL (DRAPES) ×4
DRAPE SURG ORHT 6 SPLT 77X108 (DRAPES) ×2 IMPLANT
DRAPE U-SHAPE 47X51 STRL (DRAPES) ×3 IMPLANT
DRSG ADAPTIC 3X8 NADH LF (GAUZE/BANDAGES/DRESSINGS) ×3 IMPLANT
ELECT CAUTERY BLADE 6.4 (BLADE) ×3 IMPLANT
ELECT REM PT RETURN 9FT ADLT (ELECTROSURGICAL) ×3
ELECTRODE REM PT RTRN 9FT ADLT (ELECTROSURGICAL) ×1 IMPLANT
EVACUATOR SILICONE 100CC (DRAIN) IMPLANT
GAUZE SPONGE 4X4 12PLY STRL (GAUZE/BANDAGES/DRESSINGS) ×3 IMPLANT
GAUZE SPONGE 4X4 12PLY STRL LF (GAUZE/BANDAGES/DRESSINGS) ×3 IMPLANT
GLOVE BIO SURGEON STRL SZ7.5 (GLOVE) ×3 IMPLANT
GLOVE BIOGEL PI IND STRL 6 (GLOVE) ×1 IMPLANT
GLOVE BIOGEL PI IND STRL 6.5 (GLOVE) ×2 IMPLANT
GLOVE BIOGEL PI IND STRL 8 (GLOVE) ×1 IMPLANT
GLOVE BIOGEL PI IND STRL 8.5 (GLOVE) ×1 IMPLANT
GLOVE BIOGEL PI INDICATOR 6 (GLOVE) ×2
GLOVE BIOGEL PI INDICATOR 6.5 (GLOVE) ×4
GLOVE BIOGEL PI INDICATOR 8 (GLOVE) ×2
GLOVE BIOGEL PI INDICATOR 8.5 (GLOVE) ×2
GLOVE SURG SS PI 6.5 STRL IVOR (GLOVE) ×3 IMPLANT
GLOVE SURG SS PI 7.0 STRL IVOR (GLOVE) ×3 IMPLANT
GOWN STRL REUS W/ TWL LRG LVL3 (GOWN DISPOSABLE) ×3 IMPLANT
GOWN STRL REUS W/ TWL XL LVL3 (GOWN DISPOSABLE) ×2 IMPLANT
GOWN STRL REUS W/TWL LRG LVL3 (GOWN DISPOSABLE) ×6
GOWN STRL REUS W/TWL XL LVL3 (GOWN DISPOSABLE) ×4
KIT BASIN OR (CUSTOM PROCEDURE TRAY) ×3 IMPLANT
KIT ROOM TURNOVER OR (KITS) ×3 IMPLANT
NS IRRIG 1000ML POUR BTL (IV SOLUTION) ×3 IMPLANT
PACK GENERAL/GYN (CUSTOM PROCEDURE TRAY) ×3 IMPLANT
PAD ARMBOARD 7.5X6 YLW CONV (MISCELLANEOUS) ×6 IMPLANT
STAPLER VISISTAT (STAPLE) ×3 IMPLANT
STOCKINETTE IMPERVIOUS LG (DRAPES) ×3 IMPLANT
SUT ETHILON 3 0 PS 1 (SUTURE) IMPLANT
SUT SILK 0 TIES 10X30 (SUTURE) ×3 IMPLANT
SUT SILK 2 0 (SUTURE) ×2
SUT SILK 2 0 SH CR/8 (SUTURE) ×3 IMPLANT
SUT SILK 2-0 18XBRD TIE 12 (SUTURE) ×1 IMPLANT
SUT SILK 3 0 (SUTURE) ×2
SUT SILK 3-0 18XBRD TIE 12 (SUTURE) ×1 IMPLANT
SUT VIC AB 2-0 CT1 18 (SUTURE) ×9 IMPLANT
TOWEL GREEN STERILE (TOWEL DISPOSABLE) ×3 IMPLANT
TOWEL GREEN STERILE FF (TOWEL DISPOSABLE) ×3 IMPLANT
UNDERPAD 30X30 (UNDERPADS AND DIAPERS) ×6 IMPLANT
WATER STERILE IRR 1000ML POUR (IV SOLUTION) ×3 IMPLANT

## 2016-12-18 NOTE — Anesthesia Preprocedure Evaluation (Addendum)
Anesthesia Evaluation  Patient identified by MRN, date of birth, ID band Patient awake    Reviewed: Allergy & Precautions, NPO status , Patient's Chart, lab work & pertinent test results  Airway Mallampati: III  TM Distance: >3 FB Neck ROM: Full   Comment: Patient unable to assist with airway exam Dental  (+) Edentulous Upper, Lower Dentures   Pulmonary asthma , former smoker,    Pulmonary exam normal breath sounds clear to auscultation       Cardiovascular negative cardio ROS Normal cardiovascular exam Rhythm:Regular Rate:Normal  ECG: SR, rate 67   Neuro/Psych PSYCHIATRIC DISORDERS Anxiety Depression Schizophrenia Dementia Alzheimer's disease      GI/Hepatic negative GI ROS, Neg liver ROS,   Endo/Other  diabetes, Insulin Dependent, Oral Hypoglycemic Agents  Renal/GU negative Renal ROS     Musculoskeletal negative musculoskeletal ROS (+)   Abdominal   Peds  Hematology  (+) anemia ,   Anesthesia Other Findings PVD with LLE gangrene HLD Breast cancer  Non ambulatory  Reproductive/Obstetrics                           Anesthesia Physical Anesthesia Plan  ASA: III  Anesthesia Plan: General   Post-op Pain Management:    Induction: Intravenous  PONV Risk Score and Plan: 3 and Ondansetron, Dexamethasone and Treatment may vary due to age or medical condition  Airway Management Planned: Oral ETT  Additional Equipment:   Intra-op Plan:   Post-operative Plan: Extubation in OR  Informed Consent: I have reviewed the patients History and Physical, chart, labs and discussed the procedure including the risks, benefits and alternatives for the proposed anesthesia with the patient or authorized representative who has indicated his/her understanding and acceptance.   Dental advisory given  Plan Discussed with: CRNA  Anesthesia Plan Comments:        Anesthesia Quick Evaluation

## 2016-12-18 NOTE — Anesthesia Procedure Notes (Addendum)
Procedure Name: Intubation Date/Time: 12/18/2016 10:38 AM Performed by: Trixie Deis A Pre-anesthesia Checklist: Suction available, Patient being monitored, Emergency Drugs available and Patient identified Patient Re-evaluated:Patient Re-evaluated prior to induction Oxygen Delivery Method: Circle System Utilized Preoxygenation: Pre-oxygenation with 100% oxygen Induction Type: IV induction Ventilation: Mask ventilation without difficulty and Oral airway inserted - appropriate to patient size Laryngoscope Size: Mac and 3 Grade View: Grade I Tube type: Oral Tube size: 7.5 mm Number of attempts: 1 Airway Equipment and Method: Bite block Placement Confirmation: positive ETCO2,  ETT inserted through vocal cords under direct vision and breath sounds checked- equal and bilateral Secured at: 21 cm Tube secured with: Tape Dental Injury: Teeth and Oropharynx as per pre-operative assessment

## 2016-12-18 NOTE — Consult Note (Signed)
Cardiology Consult    Patient ID: Jill Francis; [id number]; 03-29-1927   Admit date: 12/18/2016 Date of Consult: 12/18/2016  Primary Care Provider: Hennie Duos, MD Primary Cardiologist: New to [facility] - Dr. Sallyanne Kuster   Patient Profile    Jill Francis is a 83 y.o. female with past medical history of Alzheimer's disease, PVD, Type 2 DM, HLD, and history of breast cancer who is being seen today for the evaluation of post-operative atrial fibrillation at the request of Dr. Scot Dock.   History of Present Illness    [name] was initially evaluated by Dr. Scot Dock in 10/2016 for nonhealing wounds along her left leg with a left toe pressure of only 11 mmHg and monophasic doppler signals in the posterior tibial artery and no dorsalis pedis signal. Her wounds did not improve with conservative management, therefore options were given of palliative care vs. AKA. The patient's family discussed options and decided to proceed with surgical intervention.    She therefore presented to [place] today for planned L AKA performed by Dr. Scot Dock. No complications were noted during the surgery. While in PACU, she was noted to have a rhythm change from NSR to atrial fibrillation. An EKG was obtained for confirmation and shows rate-controlled atrial fibrillation, HR 88, with PVC's. She is being monitored on telemetry and HR is remaining stable in the 70's - 90's.   She has baseline dementia and was just given Fentanyl for pain. She opens her eyes but quickly falls back asleep, unable to answer questions.   By review of records, she has no known prior cardiac history. Labs this AM showed WBC of 12.6, Hgb 11.7, platelets 550, K+ 3.4, and creatinine 0.60.  Past Medical History   Past Medical History:  Diagnosis Date  . Allergic rhinitis, unspecified   . Alzheimer's disease 08/19/2012  . Anemia in other chronic diseases classified elsewhere   . Asthma   . Breast cancer ([place])   . Cancer [facility])    Skin cancer  . Depression   . Diabetes mellitus   . Hyperlipidemia   . Hypocalcemia   . Ovarian cancer ([place])   . Personal history of malignant neoplasm of ovary   . Type II or unspecified type diabetes mellitus without mention of complication, uncontrolled 08/19/2012     Allergies:   Allergies  Allergen Reactions  . Gabapentin     UNSPECIFIED REACTION   . Iodine     UNSPECIFIED REACTION   . Sulfonamide Derivatives     UNSPECIFIED REACTION     Home Medications:   Home Medications:  Prior to Admission medications   Medication Sig Start Date End Date Taking? Authorizing Provider  acetaminophen (TYLENOL) 500 MG tablet Take 1,000 mg by mouth 3 (three) times daily. (0900, 1400, & 1900)   Yes [provider]  calcitonin, salmon, (MIACALCIN/FORTICAL) 200 UNIT/ACT nasal spray Place 1 spray into alternate nostrils daily. (0900)   Yes [provider]  calcium-vitamin D (OSCAL WITH D) 500-200 MG-UNIT per tablet Take 1 tablet by mouth 3 (three) times daily with meals.    Yes [provider]  donepezil (ARICEPT) 10 MG tablet Take 10 mg by mouth at bedtime. (2100) For dementia 10/28/10  Yes [provider]  ferrous sulfate 220 (44 Fe) MG/5ML solution Take 330 mg by mouth daily. (0900) 7.5 ml - 330 mg   Yes [provider]  Insulin Glargine (TOUJEO SOLOSTAR) 300 UNIT/ML SOPN Inject 10 Units into the skin at  bedtime. (2100)   Yes [provider]  memantine (NAMENDA) 10 MG tablet Take 10 mg by mouth 2 (two) times daily. (0900 & 2100) For dementia   Yes [provider]  Multiple Vitamins-Minerals (DECUBI-VITE PO) Take 1 tablet by mouth daily. (0900)   Yes [provider]  Nutritional Supplements (NUTRITIONAL SUPPLEMENT PO) Take 120 mLs by mouth 3 (three) times daily. Med Pass 2.0 (0800, 1300, & 1800)   Yes [provider]  sitaGLIPtin (JANUVIA) 100 MG tablet Take 100 mg by mouth at bedtime. (2100)   Yes [provider]  traMADol (ULTRAM) 50 MG tablet Take 50 mg by mouth 2 (two) times daily. (0900 & 2100)   Yes [provider]  Vitamin D, Ergocalciferol, (DRISDOL) 50000 units CAPS capsule Take 50,000 Units by mouth every 30 (thirty) days.   Yes [provider]    Inpatient Medications    Scheduled Meds: . chlorhexidine  60 mL Topical Once   And  . [START ON 12/19/2016] chlorhexidine  60 mL Topical Once  . insulin aspart  0-15 Units Subcutaneous TID WC   Continuous Infusions: . sodium chloride 10 mL/hr at 12/18/16 0736   PRN Meds: fentaNYL (SUBLIMAZE) injection, fentaNYL (SUBLIMAZE) injection, HYDROmorphone (DILAUDID) injection, ondansetron (ZOFRAN) IV  Family History    Family History  Problem Relation Age of Onset  . Pancreatic cancer Neg Hx     Social History    Social History   Social History  . Marital status: Divorced    Spouse name: N/A  . Number of children: 0  . Years of education: N/A   Occupational History  . [employer]    Social History Main Topics  . Smoking status: Former Research scientist (life sciences)  . Smokeless tobacco: Never Used  . Alcohol use No  . Drug use: No  . Sexual activity: Not on file   Other Topics Concern  . Not on file   Social History Narrative   Admitted to [place] [facility] 05/22/09   Divorced   Former smoker   Alcohol none   DNR        Review of Systems    Unable to be obtained secondary to baseline dementia.   Physical Exam/Data    Blood pressure (!) 130/114, pulse 89, temperature (!) 97.5 F (36.4 C), resp. rate (!) 21, SpO2 100 %.  General: Elderly African American female appearing in NAD Psych: Normal affect. Sleepy, but arousable.  Neuro: Unable to be assessed. Opens eyes but quickly falls back asleep.  HEENT: Normal  Neck: Supple without bruits or JVD. Lungs:  Resp regular and unlabored, CTA without wheezing or rales. Heart: Irregularly irregular, no s3, s4, or murmurs. No friction rub appreciated.    Abdomen: Soft, non-tender, non-distended, BS + x 4.  Extremities: No clubbing, cyanosis or edema. L AKA - dressings in place.    EKG:  The EKG was personally reviewed and demonstrates: rate-controlled atrial fibrillation, HR 88, with PVC's.   Telemetry:  Telemetry was personally reviewed and demonstrates:  Atrial fibrillation, HR in 80's - 90's.    Labs/Studies     Relevant CV Studies:  ABI: 10/2016    Laboratory Data:  Chemistry Recent Labs Lab 12/18/16 0747  NA 139  K 3.4*  CL 104  GLUCOSE 184*  BUN 15  CREATININE 0.60    No results for input(s): PROT, ALBUMIN, AST, ALT, ALKPHOS, BILITOT in the last 168 hours. Hematology Recent Labs Lab 12/18/16 0739 12/18/16 0747  WBC  12.6*  --   RBC 3.92  --   HGB 11.7* 11.2*  HCT 35.1* 33.0*  MCV 89.5  --   MCH 29.8  --   MCHC 33.3  --   RDW 15.3  --   PLT 550*  --    Cardiac EnzymesNo results for input(s): TROPONINI in the last 168 hours. No results for input(s): TROPIPOC in the last 168 hours.  BNPNo results for input(s): BNP, PROBNP in the last 168 hours.  DDimer No results for input(s): DDIMER in the last 168 hours.  Radiology/Studies:  No results found.   Assessment & Plan    1. Post-operative Atrial Fibrillation - she has no known cardiac history by review of records. Has documented PVD and had developed nonhealing wounds along her left leg, resulting in the need for Left AKA. She tolerated the procedure well but went into atrial fibrillation while in the PACU.  - while she is in atrial fibrillation, HR is well-controlled in the 70's - 80's. She was not on any AV nodal blocking agents PTA. BP is soft after administration of pain medication, therefore will write for PRN IV Lopressor at this time. Continue to follow rhythm on telemetry. Will recheck BMET (K+ 3.4 this AM) along with Mg and TSH.  - This patients CHA2DS2-VASc Score and unadjusted Ischemic Stroke Rate (% per year) is equal to 7.2 % stroke rate/year  from a score of 5 (DM, Vascular, Female, Age (2)). With no known history of atrial fibrillation and having just gone into the arrhythmia after surgery, would not start anticoagulation at this time. Is she remains in atrial fibrillation, will need to discuss risks and benefits of anticoagulation with the patient's family.    2. PVD - s/p L AKA by Dr. Scot Dock.  - Vascular Surgery following.   3. Alzheimer's Dementia - patient unable to contribute to history and no family present as the patient was evaluated in the PACU.    Signed, Erma Heritage, PA-C 12/18/2016, 2:08 PM Pager: [phone]  I have seen and examined the patient along with Erma Heritage, PA-C.  I have reviewed the chart, notes and new data.  I agree with PA's note.  Key new complaints: unable to obtain history Key examination changes: irregular rhythm, otherwise normal CV exam Key new findings / data: mild hypokalemia, markedly decreased iCa; AFib w spontaneous V rate control, questionable old inferior MI.   PLAN: Observe for now. No need for rate control meds. I would recommend anticoagulation once safe from surgical point of view - then reassess burden of arrhythmia with 30 day monitor. Correct electrolyte abnormalities. Check echo.  Sanda Klein, MD, [place] [phone] 12/18/2016, 3:15 PM

## 2016-12-18 NOTE — Interval H&P Note (Signed)
History and Physical Interval Note:  12/18/2016 9:57 AM  Jill Francis  has presented today for surgery, with the diagnosis of PVD with LLE gangrene I70.262  The various methods of treatment have been discussed with the patient and family. After consideration of risks, benefits and other options for treatment, the patient has consented to  Procedure(s): AMPUTATION ABOVE KNEE LEFT (Left) as a surgical intervention .  The patient's history has been reviewed, patient examined, no change in status, stable for surgery.  I have reviewed the patient's chart and labs.  Questions were answered to the patient's satisfaction.     [name]

## 2016-12-18 NOTE — Op Note (Signed)
    NAME: [name]    MRN: [id number] DOB: 1927/08/07    DATE OF OPERATION: 12/18/2016  PREOP DIAGNOSIS:    Gangrene of the left foot  POSTOP DIAGNOSIS:    Same  PROCEDURE:    Left above-the-knee amputation  SURGEON: Judeth Cornfield. Scot Dock, MD, FACS  ASSIST: Linus Orn SA  ANESTHESIA: Gen.   EBL: Minimal  INDICATIONS:    Jill Francis is a 83 y.o. female who is nonambulatory and has a history of significant Alzheimer's dementia. She presented with a nonhealing wound of her left leg and was not a candidate for revascularization.  FINDINGS:    The muscle appeared adequately perfused.  TECHNIQUE:    The patient was taken to the operative room and received a general anesthetic. The left leg was prepped and draped in the usual sterile fashion. A fishmouth incision was marked above the level of the patella. Tourniquet was placed on the upper thigh. The leg was exsanguinated with an Esmarch bandage. The tourniquet was inflated to 300 mmHg. Under tourniquet control, the incision was carried down to the skin, subcutaneous tissue, muscle and fascia to the femur which was dissected free circumferentially. The periosteum was elevated. The bone was divided proximal to the level of skin division. The artery and vein were individually suture ligated. The tourniquet was then released. Additional hemostasis was obtained using electrocautery and 2-0 silk ties. The wound was irrigated with saline after the edges of bone were rasped. The fascial layer was closed with interrupted 2-0 Vicryl. The skin was closed staples. Sterile dressing was applied. The patient tolerated the procedure well and was transferred to the recovery room in stable condition. All needle and sponge counts were correct.  [name], MD, FACS [facility]  DATE OF DICTATION:   12/18/2016

## 2016-12-18 NOTE — Progress Notes (Signed)
Medications reviewed with Nira Conn, nurse from [facility].  Nira Conn stated that blood sugar was 68 at 0600 and gel pack was given- blood sugar was rechecked at was 82.

## 2016-12-18 NOTE — H&P (View-Only) (Signed)
Patient name: Jill Francis MRN: [id number] DOB: [birth year] Sex: female  REASON FOR VISIT:    Follow up of left leg wounds and peripheral vascular disease.  HPI:   Jill Francis is a pleasant 83 y.o. female who I saw in consultation on 10/22/2016. The patient has a history of severe dementia and Alzheimer's disease and has been nonambulatory for 2-3 years. She fractured her left leg in May and was in a cast for 4 weeks. When this was taken off another cast was placed for another 4 weeks. When the second one was removed there were some blisters on her left leg which have been slow to heal. When I saw her in consultation on 10/22/2016 felt she was clearly not a candidate for any type of revascularization. A start her exam she had evidence of multilevel arterial occlusive disease. Toe pressure on the left was only 11 mmHg. She had monophasic Doppler signals in the posterior tibial artery only on the left with no dorsalis pedis signal. My feeling was, that if the wounds progressed, the options would be palliative care versus a left above-the-knee amputation. Of note she's previously had a left total knee replacement.  She is here today with her niece. It is difficult to determine if she's having any pain. She has severe dementia with Alzheimer's disease. The niece is unaware of any fever or significant drainage from the wounds.  Current Outpatient Prescriptions  Medication Sig Dispense Refill  . acetaminophen (TYLENOL) 500 MG tablet Take 1,000 mg by mouth 3 (three) times daily.    . calcium-vitamin D (OSCAL WITH D) 500-200 MG-UNIT per tablet Take 1 tablet by mouth 3 (three) times daily with meals.     . Cholecalciferol (VITAMIN D3) 50000 units CAPS Take 50,000 Units by mouth every 30 (thirty) days.     Marland Kitchen donepezil (ARICEPT) 10 MG tablet Take 10 mg by mouth at bedtime. For dementia    . ferrous sulfate 220 (44 Fe) MG/5ML solution Take 330 mg by mouth daily. 7.5 ml - 330 mg    . Insulin Glargine  (TOUJEO SOLOSTAR) 300 UNIT/ML SOPN Inject 10 Units into the skin at bedtime.     . memantine (NAMENDA) 10 MG tablet Take 10 mg by mouth 2 (two) times daily. For dementia    . Nutritional Supplements (NUTRITIONAL SUPPLEMENT PO) Take 120 mLs by mouth 2 (two) times daily. Med Pass 2.0    . sitaGLIPtin (JANUVIA) 100 MG tablet Take 100 mg by mouth at bedtime.     . traMADol (ULTRAM) 50 MG tablet Take 50 mg by mouth 2 (two) times daily.     No current facility-administered medications for this visit.     REVIEW OF SYSTEMS:  [X]  denotes positive finding, [ ]  denotes negative finding Cardiac  Comments:  Chest pain or chest pressure:    Shortness of breath upon exertion:    Short of breath when lying flat:    Irregular heart rhythm:    Constitutional    Fever or chills:     PHYSICAL EXAM:   Vitals:   12/10/16 1551  BP: 125/78  Pulse: 86  Temp: 97.7 F (36.5 C)  TempSrc: Oral  SpO2: 99%  Height: 5\' 4"  (1.626 m)    GENERAL: The patient is a well-nourished female, in no acute distress. The vital signs are documented above. CARDIOVASCULAR: There is a regular rate and rhythm. PULMONARY: There is good air exchange bilaterally without wheezing or rales. She has dry gangrene involving  the entire medial aspect of her distal left calf and ankle extending onto the foot. There is no significant erythema or drainage currently.  DATA:   I reviewed the previous knee x-ray on the left which shows that there should not be any metal that would interfere with an above-the-knee amputation.  MEDICAL ISSUES:   GANGRENE OF THE LEFT LEG WITH MULTILEVEL ARTERIAL OCCLUSIVE DISEASE: The patient is clearly not a candidate for revascularization. I have again discussed with the family that the options would be palliative care versus an above-the-knee amputation. Given her age and debilitated state certainly any surgery would be associated with significant risk. I have explained that if they decide to go with  palliative care then certainly this could become infected and cause her demise. I've instructed the nursing home to do dressing changes with dry gauze and to soak the foot daily and lukewarm Dial soap soap to keep this dry. The family will discuss this and call if they decide to proceed with left above-the-knee amputation. If they do agree think she would need to be admitted by the hospitalists after surgery. She has multiple medical conditions including diabetes, hyperlipidemia, anemia of chronic disease, Alzheimer's disease, and hypocalcemia.  [name] [facility] [phone]

## 2016-12-18 NOTE — Progress Notes (Signed)
Anesthesiology Note:  83 year old female with PVD, advanced dementia, Type 2 DM, hyperlipidemia who underwent L. above knee amputation under general anesthesia with LMA today by Dr. Scot Dock. The patient has no known previous history of  heart disease.  Last  ECG on 10/17/16 showed sinus rhythm.  Upon induction of anesthesia patient was noted to be in sinus rhythm but intra-operatively she developed an irregular rhythm. The anesthetic course was otherwise uneventful. In PACU a 12 lead ECG confirmed she was in atrial fibrillation.   Patient lethargic in PACU, not following commands   VS: T-36 BP- 12770 HR- 90 RR- 20 O2 Sat 99% on 2L  Impression: New onset atrial fibrillation which developed intra-operatively  in 83 year old female with advanced dementia, Type 2 DM, and PVD.  [facility] assistance with management.  Roberts Gaudy

## 2016-12-18 NOTE — Progress Notes (Signed)
Dr. Scot Dock notified that we were unable to collect a UA and CMET and that an I-Stat 8 was sufficient.

## 2016-12-18 NOTE — Transfer of Care (Signed)
Immediate Anesthesia Transfer of Care Note  Patient: Jill Francis  Procedure(s) Performed: AMPUTATION ABOVE KNEE LEFT (Left )  Patient Location: PACU  Anesthesia Type:General  Level of Consciousness: awake, alert , oriented and patient cooperative  Airway & Oxygen Therapy: Patient Spontanous Breathing and Patient connected to nasal cannula oxygen  Post-op Assessment: Report given to RN and Post -op Vital signs reviewed and stable  Post vital signs: Reviewed and stable  Last Vitals:  Vitals:   12/18/16 0721 12/18/16 0808  BP: (!) 147/95   Pulse:  74  Temp: (!) 36.3 C   SpO2:  96%    Last Pain:  Vitals:   12/18/16 0800  TempSrc: Oral      Patients Stated Pain Goal:  (pt. unable to rate pain) (52/08/02 2336)  Complications: No apparent anesthesia complications

## 2016-12-19 ENCOUNTER — Inpatient Hospital Stay (HOSPITAL_COMMUNITY): Payer: Medicare Other

## 2016-12-19 ENCOUNTER — Encounter (HOSPITAL_COMMUNITY): Payer: Self-pay | Admitting: Vascular Surgery

## 2016-12-19 DIAGNOSIS — I4891 Unspecified atrial fibrillation: Secondary | ICD-10-CM

## 2016-12-19 DIAGNOSIS — I351 Nonrheumatic aortic (valve) insufficiency: Secondary | ICD-10-CM

## 2016-12-19 DIAGNOSIS — I739 Peripheral vascular disease, unspecified: Secondary | ICD-10-CM

## 2016-12-19 LAB — CBC
HCT: 28.5 % — ABNORMAL LOW (ref 36.0–46.0)
Hemoglobin: 9.1 g/dL — ABNORMAL LOW (ref 12.0–15.0)
MCH: 28.4 pg (ref 26.0–34.0)
MCHC: 31.9 g/dL (ref 30.0–36.0)
MCV: 89.1 fL (ref 78.0–100.0)
PLATELETS: 438 10*3/uL — AB (ref 150–400)
RBC: 3.2 MIL/uL — ABNORMAL LOW (ref 3.87–5.11)
RDW: 15.2 % (ref 11.5–15.5)
WBC: 10.2 10*3/uL (ref 4.0–10.5)

## 2016-12-19 LAB — ECHOCARDIOGRAM COMPLETE
AVPHT: 436 ms
CHL CUP TV REG PEAK VELOCITY: 229 cm/s
EWDT: 384 ms
FS: 34 % (ref 28–44)
IV/PV OW: 1.38
LA diam end sys: 32 mm
LA diam index: 1.99 cm/m2
LA vol A4C: 32.5 ml
LA vol index: 27.5 mL/m2
LA vol: 44.2 mL
LASIZE: 32 mm
LDCA: 2.54 cm2
LV PW d: 8 mm — AB (ref 0.6–1.1)
LVOT diameter: 18 mm
MV Dec: 384
MVPKEVEL: 0.9 m/s
RV sys press: 24 mmHg
TAPSE: 22.1 mm
TRMAXVEL: 229 cm/s
Weight: 2012.8 oz

## 2016-12-19 LAB — BASIC METABOLIC PANEL
ANION GAP: 10 (ref 5–15)
BUN: 12 mg/dL (ref 6–20)
CALCIUM: 6.5 mg/dL — AB (ref 8.9–10.3)
CO2: 24 mmol/L (ref 22–32)
CREATININE: 0.83 mg/dL (ref 0.44–1.00)
Chloride: 104 mmol/L (ref 101–111)
Glucose, Bld: 411 mg/dL — ABNORMAL HIGH (ref 65–99)
Potassium: 3.5 mmol/L (ref 3.5–5.1)
Sodium: 138 mmol/L (ref 135–145)

## 2016-12-19 LAB — GLUCOSE, CAPILLARY
GLUCOSE-CAPILLARY: 254 mg/dL — AB (ref 65–99)
Glucose-Capillary: 210 mg/dL — ABNORMAL HIGH (ref 65–99)
Glucose-Capillary: 30 mg/dL — CL (ref 65–99)
Glucose-Capillary: 144 mg/dL — ABNORMAL HIGH (ref 65–99)
Glucose-Capillary: 102 mg/dL — ABNORMAL HIGH (ref 65–99)

## 2016-12-19 LAB — MAGNESIUM: Magnesium: 1.7 mg/dL (ref 1.7–2.4)

## 2016-12-19 LAB — TSH: TSH: 2.105 u[IU]/mL (ref 0.350–4.500)

## 2016-12-19 MED ORDER — DEXTROSE 50 % IV SOLN
50.0000 mL | Freq: Once | INTRAVENOUS | Status: AC
  Administered 2016-12-19: 50 mL via INTRAVENOUS

## 2016-12-19 MED ORDER — PRO-STAT SUGAR FREE PO LIQD
30.0000 mL | Freq: Two times a day (BID) | ORAL | Status: DC
  Administered 2016-12-19 – 2016-12-23 (×7): 30 mL via ORAL
  Filled 2016-12-19 (×8): qty 30

## 2016-12-19 MED ORDER — ENSURE ENLIVE PO LIQD
237.0000 mL | Freq: Two times a day (BID) | ORAL | Status: DC
  Administered 2016-12-22 – 2016-12-23 (×3): 237 mL via ORAL

## 2016-12-19 MED ORDER — MAGNESIUM SULFATE 2 GM/50ML IV SOLN
2.0000 g | Freq: Once | INTRAVENOUS | Status: AC
  Administered 2016-12-19: 2 g via INTRAVENOUS
  Filled 2016-12-19: qty 50

## 2016-12-19 MED ORDER — DEXTROSE 50 % IV SOLN
INTRAVENOUS | Status: AC
  Filled 2016-12-19: qty 50

## 2016-12-19 NOTE — Progress Notes (Signed)
  PT Cancellation Note/ Discharge  Patient Details Name: Jill Francis MRN: [id number] DOB: [birth year]   Cancelled Treatment:    Reason Eval/Treat Not Completed: PT screened, no needs identified, will sign off. Pt total assist with lift for 2-3 years from [facility] with plan to return to SNF. Pt with Alzheimers and no plan for prosthesis. Discussed PLOF and all above with niece who agreed that pt does not require acute therapy and recommend return to SNF with any need for services deferred to next venue. Will sign off.    Eann Cleland B Lasharon Dunivan 12/19/2016, 10:10 AM  Elwyn Reach, [place]

## 2016-12-19 NOTE — Progress Notes (Signed)
   VASCULAR SURGERY ASSESSMENT & PLAN:   1 Day Post-Op s/p: Left above-the-knee amputation.  Dressing change tomorrow or Sunday.  CARDIAC: The patient developed postoperative atrial fibrillation. Appreciate cardiology's input. Dr. Sallyanne Kuster has recommended anticoagulation once safe from my standpoint and then reassess burden of arrhythmia with 30 day monitor. From my standpoint, she could start anticoagulation on Monday.  Back to skilled nursing facility next week once stable from a cardiac standpoint.    SUBJECTIVE:   At her baseline. She has severe Alzheimer's dementia.  PHYSICAL EXAM:   Vitals:   12/18/16 1953 12/18/16 2019 12/19/16 0315 12/19/16 0559  BP:  118/60 (!) 110/38 (!) 112/58  Pulse: 87 89 69 67  Resp: 16 16 16 16   Temp: (!) 97.2 F (36.2 C) 98.7 F (37.1 C) 98.3 F (36.8 C) 97.9 F (36.6 C)  TempSrc:  Oral Oral Axillary  SpO2: 100% 100% 95% 96%  Weight:  125 lb 12.8 oz (57.1 kg)     Dressing on left AKA is dry.  LABS:   Lab Results  Component Value Date   WBC 10.2 12/19/2016   HGB 9.1 (L) 12/19/2016   HCT 28.5 (L) 12/19/2016   MCV 89.1 12/19/2016   PLT 438 (H) 12/19/2016   Lab Results  Component Value Date   CREATININE 0.83 12/19/2016   Lab Results  Component Value Date   INR 1.14 09/10/2014   CBG (last 3)   Recent Labs  12/18/16 1903 12/18/16 2209 12/19/16 0808  GLUCAP 208* 297* 210*    PROBLEM LIST:    Active Problems:   Gangrene of left foot (HCC)   CURRENT MEDS:   . acetaminophen  1,000 mg Oral TID  . calcitonin (salmon)  1 spray Alternating Nares Daily  . calcium-vitamin D  1 tablet Oral TID WC  . docusate sodium  100 mg Oral Daily  . donepezil  10 mg Oral QHS  . enoxaparin (LOVENOX) injection  40 mg Subcutaneous Q24H  . feeding supplement  1 Container Oral TID BM  . ferrous sulfate  330 mg Oral Daily  . insulin aspart  0-15 Units Subcutaneous TID WC  . insulin glargine  10 Units Subcutaneous QHS  . linagliptin  5 mg  Oral Daily  . memantine  10 mg Oral BID  . multivitamin with minerals  1 tablet Oral Daily  . pantoprazole  40 mg Oral Daily  . traMADol  50 mg Oral BID  . [START ON 12/26/2016] Vitamin D (Ergocalciferol)  50,000 Units Oral Q30 days    Gae Gallop Beeper: [phone] Office: [phone] 12/19/2016

## 2016-12-19 NOTE — Progress Notes (Signed)
OT Cancellation Note  Patient Details Name: Jill Francis MRN: [id number] DOB: 1927/04/02   Cancelled Treatment:    Reason Eval/Treat Not Completed: OT screened, no needs identified, will sign off. Spoke with pt's niece who reports that pt has been nonambulatory for 3+ years, staff at [facility] utilize lift equipment for transfers, and pt was total A at baseline with ADLs.   Tyrone Schimke OTR/L Pager: [phone]   12/19/2016, 9:11 AM

## 2016-12-19 NOTE — Progress Notes (Signed)
  Echocardiogram 2D Echocardiogram has been performed.  Jill Francis Jill Francis 12/19/2016, 3:41 PM

## 2016-12-19 NOTE — Progress Notes (Signed)
Hypoglycemic Event  CBG: 30  Treatment: D50 IV 50 mL  Symptoms: None lethargy  Follow-up CBG: Time: 1244 CBG Result: 144  Possible Reasons for Event: Inadequate meal intake  Comments/MD notified:    Suzan Nailer

## 2016-12-19 NOTE — Progress Notes (Signed)
Progress Note  Patient Name: Jill Francis Date of Encounter: 12/19/2016  Primary Cardiologist: New to [facility] - Dr. Sallyanne Kuster  Subjective   Patient awake and mumbling non coherent statements. Patient's closest of kin (Niece - Jeani Hawking) at the bedside. Reports the patient resides at [facility]. Is mostly non-verbal and non-ambulatory at baseline.   Inpatient Medications    Scheduled Meds: . acetaminophen  1,000 mg Oral TID  . calcitonin (salmon)  1 spray Alternating Nares Daily  . calcium-vitamin D  1 tablet Oral TID WC  . docusate sodium  100 mg Oral Daily  . donepezil  10 mg Oral QHS  . enoxaparin (LOVENOX) injection  40 mg Subcutaneous Q24H  . feeding supplement  1 Container Oral TID BM  . ferrous sulfate  330 mg Oral Daily  . insulin aspart  0-15 Units Subcutaneous TID WC  . insulin glargine  10 Units Subcutaneous QHS  . linagliptin  5 mg Oral Daily  . memantine  10 mg Oral BID  . multivitamin with minerals  1 tablet Oral Daily  . pantoprazole  40 mg Oral Daily  . traMADol  50 mg Oral BID  . [START ON 12/26/2016] Vitamin D (Ergocalciferol)  50,000 Units Oral Q30 days   Continuous Infusions: . cefUROXime (ZINACEF)  IV Stopped (12/19/16 0025)  . dextrose 5 % and 0.45 % NaCl with KCl 20 mEq/L 75 mL/hr at 12/18/16 2211  . magnesium sulfate 1 - 4 g bolus IVPB     PRN Meds: acetaminophen **OR** acetaminophen, alum & mag hydroxide-simeth, guaiFENesin-dextromethorphan, hydrALAZINE, HYDROmorphone (DILAUDID) injection, labetalol, magnesium sulfate 1 - 4 g bolus IVPB, metoprolol tartrate, metoprolol tartrate, ondansetron, oxyCODONE-acetaminophen, phenol, potassium chloride   Vital Signs    Vitals:   12/18/16 1953 12/18/16 2019 12/19/16 0315 12/19/16 0559  BP:  118/60 (!) 110/38 (!) 112/58  Pulse: 87 89 69 67  Resp: 16 16 16 16   Temp: (!) 97.2 F (36.2 C) 98.7 F (37.1 C) 98.3 F (36.8 C) 97.9 F (36.6 C)  TempSrc:  Oral Oral Axillary  SpO2: 100% 100% 95% 96%  Weight:  125  lb 12.8 oz (57.1 kg)      Intake/Output Summary (Last 24 hours) at 12/19/16 0904 Last data filed at 12/19/16 0559  Gross per 24 hour  Intake          1521.25 ml  Output              175 ml  Net          1346.25 ml   Filed Weights   12/18/16 2019  Weight: 125 lb 12.8 oz (57.1 kg)    Telemetry    Atrial fibrillation, HR in mid-50's to 60's. Occasional PVC's.  - Personally Reviewed  ECG    No new tracings.   Physical Exam   General: Elderly African American female appearing in no acute distress. Head: Normocephalic, atraumatic.  Neck: Supple without bruits, JVD not elevated. Lungs: Resp regular and unlabored, CTA without wheezing or rakes. Heart: Irregularly irregular, S1, S2, no S3, S4, or murmur; no rub. Abdomen: Soft, non-tender, non-distended with normoactive bowel sounds. No hepatomegaly. No rebound/guarding. No obvious abdominal masses. Extremities: No clubbing, cyanosis, or edema. Left AKA.  Neuro: Alert and oriented X1 (person). Moves all extremities spontaneously. Psych: Normal affect.  Labs    Chemistry Recent Labs Lab 12/18/16 0747 12/18/16 2024 12/19/16 0453  NA 139  --  138  K 3.4*  --  3.5  CL 104  --  104  CO2  --   --  24  GLUCOSE 184*  --  411*  BUN 15  --  12  CREATININE 0.60 0.77 0.83  CALCIUM  --   --  6.5*  GFRNONAA  --  >60 >60  GFRAA  --  >60 >60  ANIONGAP  --   --  10     Hematology Recent Labs Lab 12/18/16 0739 12/18/16 0747 12/18/16 2024 12/19/16 0453  WBC 12.6*  --  15.6* 10.2  RBC 3.92  --  3.62* 3.20*  HGB 11.7* 11.2* 10.1* 9.1*  HCT 35.1* 33.0* 32.2* 28.5*  MCV 89.5  --  89.0 89.1  MCH 29.8  --  27.9 28.4  MCHC 33.3  --  31.4 31.9  RDW 15.3  --  14.7 15.2  PLT 550*  --  533* 438*    Cardiac EnzymesNo results for input(s): TROPONINI in the last 168 hours. No results for input(s): TROPIPOC in the last 168 hours.   BNPNo results for input(s): BNP, PROBNP in the last 168 hours.   DDimer No results for input(s):  DDIMER in the last 168 hours.   Radiology    No results found.  Cardiac Studies   Echocardiogram: Pending  Patient Profile     83 y.o. female w/ PMH of Alzheimer's disease, PVD, Type 2 DM, HLD, and history of breast cancer who presented to [facility] on 12/18/2016 for planned Left AKA. Cardiology consulted for post-operative atrial fibrillation.  Assessment & Plan    1. Post-operative Atrial Fibrillation - she has no known cardiac history by review of records. Has documented PVD and had developed nonhealing wounds along her left leg, resulting in the need for Left AKA. She tolerated the procedure well but went into atrial fibrillation while in the PACU.  - while she is in atrial fibrillation, HR is well-controlled in the 70's - 80's. She was not on any AV nodal blocking agents PTA. HR remains in the 50's - 60's, therefore continue to avoid AV nodal blocking agents. TSH WNL. K+ 3.5. Mg 1.7 (will replace).  - This patients CHA2DS2-VASc Score and unadjusted Ischemic Stroke Rate (% per year) is equal to 7.2 % stroke rate/year from a score of 5 (DM, Vascular, Female, Age (2)). With no known history of atrial fibrillation and having just gone into the arrhythmia after surgery, would not start anticoagulation at this time. Discussed risks and benefits of anticoagulation with the patient's niece and she will further evaluate. With her baseline dementia and poor functional status, anticoagulation might not be the best option.    2. PVD - s/p L AKA by Dr. Scot Dock.  - Vascular Surgery following.   3. Alzheimer's Dementia - patient unable to contribute to history. Patient's niece at the bedside.    Arna Medici , PA-C 9:04 AM 12/19/2016 Pager: [phone]  I have seen and examined the patient along with Erma Heritage , PA-C.  I have reviewed the chart, notes and new data.  I agree with PA/NP's note.  Key new complaints: nonverbal, refusing to take meds, following other  commands Key examination changes: no bleeding at amputation site, irregular rhtythm Key new findings / data: AFib w spontaneous rate control on monitor  PLAN:  Her dementia is clearly more advanced than initially apparent when we saw her yesterday. Less may be better in this elderly lady - we may cause more harm than benefit with anticoagulation. Discussed the relative pros and cons of anticoagulation with  her niece. She will think about it. She does not require antiarrhythmics or rate control meds. Ca is low even when accounting for hypoalbuminemia. Check ionized calcium. Echo pending.  Sanda Klein, MD, [place] [phone] 12/19/2016, 10:07 AM

## 2016-12-19 NOTE — Progress Notes (Signed)
Physical medicine rehabilitation consult requested chart reviewed.Patient presently is a resident of [facility] skilled nursing facility with history of severe dementia. Plan is to return back to skilled nursing facility. Hold on rehabilitation consult at this time with recommendations to return to skilled nursing facility

## 2016-12-19 NOTE — Anesthesia Postprocedure Evaluation (Signed)
Anesthesia Post Note  Patient: Jill Francis  Procedure(s) Performed: AMPUTATION ABOVE KNEE LEFT (Left )     Patient location during evaluation: PACU Anesthesia Type: General Level of consciousness: lethargic (return to baseline) Pain management: pain level controlled Vital Signs Assessment: post-procedure vital signs reviewed and stable Respiratory status: spontaneous breathing, nonlabored ventilation, respiratory function stable and patient connected to nasal cannula oxygen Cardiovascular status: blood pressure returned to baseline (a-fib) Postop Assessment: no apparent nausea or vomiting Anesthetic complications: no Comments: Cardiology consulted for post-operative atrial-fibrillation     Last Vitals:  Vitals:   12/19/16 0315 12/19/16 0559  BP: (!) 110/38 (!) 112/58  Pulse: 69 67  Resp: 16 16  Temp: 36.8 C 36.6 C  SpO2: 95% 96%    Last Pain:  Vitals:   12/19/16 0559  TempSrc: Axillary  PainSc:                  Jill Francis

## 2016-12-19 NOTE — Progress Notes (Signed)
Initial Nutrition Assessment  DOCUMENTATION CODES:   Not applicable  INTERVENTION:   -Downgrade diet to dysphagia 1 for ease of intake -Ensure Enlive po BID, each supplement provides 350 kcal and 20 grams of protein -30 ml Prostat BID, each supplement provides 100 kcals and 15 grams protein -MVI daily  NUTRITION DIAGNOSIS:   Inadequate oral intake related to lethargy/confusion, poor appetite as evidenced by meal completion < 25%.  GOAL:   Patient will meet greater than or equal to 90% of their needs  MONITOR:   PO intake, Supplement acceptance, Labs, Weight trends, Skin, I & O's  REASON FOR ASSESSMENT:   Low Braden    ASSESSMENT:    Jill Francis is a 83 y.o. female who is nonambulatory and has a history of significant Alzheimer's dementia. She presented with a nonhealing wound of her left leg and was not a candidate for revascularization.  S/p Procedure(s) on 12/18/16: AMPUTATION ABOVE KNEE LEFT (Left)  PTA medications reviewed and pertinent medications include 100 mg januvia q HS, 10 units Tuojeo q HS, and MVI daily. Pt was on a mechanical soft diet with pureed meats PTA.   Pt very lethargic at time of visit. Spoke with pt niece at bedside, who reports decreased appetite over the past 4 months, since transferring SNF. She shares that pt's diet was downgraded to puree and pt continued eat very little. Pt typically take medications and liquids very well, however, pt had difficulty with liquids, foods, and medications today. Pt did consume Boost Breeze supplement provided this morning. Niece suspects pt has lost weight by her appearance, but is unsure how much weight she has actually lost or what UBW is.   Nutrition-Focused physical exam completed. Findings are no fat depletion, mild muscle depletion, and no edema.   Pt niece amenable to supplements to assist with nutritional intake and wound healing. She also requests downgrade to pureed diet for ease of intake.   Labs  reviewed: CBGS: 208-297 (inpatient orders for glycemic control are 5 mg linagliptin, 10 units insulin glagrine daily, and 0-15 units insulin aspart TID).   Diet Order:  DIET - DYS 1 Room service appropriate? Yes; Fluid consistency: Thin  Skin:   (lt leg incision)  Last BM:  12/19/16  Height:   Ht Readings from Last 1 Encounters:  12/10/16 5\' 4"  (1.626 m)    Weight:   Wt Readings from Last 1 Encounters:  12/18/16 125 lb 12.8 oz (57.1 kg)    Ideal Body Weight:  50.2 kg  BMI:  Body mass index is 21.59 kg/m.  Estimated Nutritional Needs:   Kcal:  1450-1650  Protein:  70-85 grams  Fluid:  > 1.4 L  EDUCATION NEEDS:   Education needs addressed  Carina Chaplin A. Jimmye Norman, RD, LDN, CDE Pager: [phone] After hours Pager: [phone]

## 2016-12-20 DIAGNOSIS — I481 Persistent atrial fibrillation: Secondary | ICD-10-CM

## 2016-12-20 LAB — BASIC METABOLIC PANEL
Anion gap: 6 (ref 5–15)
BUN: 9 mg/dL (ref 6–20)
CALCIUM: 6.5 mg/dL — AB (ref 8.9–10.3)
CO2: 26 mmol/L (ref 22–32)
Chloride: 107 mmol/L (ref 101–111)
Creatinine, Ser: 0.57 mg/dL (ref 0.44–1.00)
GFR calc non Af Amer: >60 mL/min (ref >60)
Glucose, Bld: 82 mg/dL (ref 65–99)
Potassium: 4 mmol/L (ref 3.5–5.1)
SODIUM: 139 mmol/L (ref 135–145)

## 2016-12-20 LAB — GLUCOSE, CAPILLARY
GLUCOSE-CAPILLARY: 96 mg/dL (ref 65–99)
GLUCOSE-CAPILLARY: 107 mg/dL — AB (ref 65–99)
GLUCOSE-CAPILLARY: 129 mg/dL — AB (ref 65–99)
Glucose-Capillary: 56 mg/dL — ABNORMAL LOW (ref 65–99)
Glucose-Capillary: 70 mg/dL (ref 65–99)

## 2016-12-20 LAB — CBC
HCT: 28.9 % — ABNORMAL LOW (ref 36.0–46.0)
Hemoglobin: 9.4 g/dL — ABNORMAL LOW (ref 12.0–15.0)
MCH: 28.9 pg (ref 26.0–34.0)
MCHC: 32.5 g/dL (ref 30.0–36.0)
MCV: 88.9 fL (ref 78.0–100.0)
Platelets: 423 10*3/uL — ABNORMAL HIGH (ref 150–400)
RBC: 3.25 MIL/uL — AB (ref 3.87–5.11)
RDW: 15.3 % (ref 11.5–15.5)
WBC: 9.1 10*3/uL (ref 4.0–10.5)

## 2016-12-20 MED ORDER — GLUCOSE 40 % PO GEL
ORAL | Status: AC
  Administered 2016-12-20: 37.5 g
  Filled 2016-12-20: qty 1

## 2016-12-20 MED ORDER — SODIUM CHLORIDE 0.9 % IV BOLUS (SEPSIS)
250.0000 mL | Freq: Once | INTRAVENOUS | Status: AC
  Administered 2016-12-20: 250 mL via INTRAVENOUS

## 2016-12-20 NOTE — Progress Notes (Signed)
Progress Note  Patient Name: Jill Francis Date of Encounter: 12/20/2016  Primary Cardiologist:   New Dr. Sallyanne Kuster  Subjective   Agitated in bed.  Does report pain but cannot localize.    Inpatient Medications    Scheduled Meds: . acetaminophen  1,000 mg Oral TID  . calcitonin (salmon)  1 spray Alternating Nares Daily  . calcium-vitamin D  1 tablet Oral TID WC  . docusate sodium  100 mg Oral Daily  . donepezil  10 mg Oral QHS  . enoxaparin (LOVENOX) injection  40 mg Subcutaneous Q24H  . feeding supplement (ENSURE ENLIVE)  237 mL Oral BID BM  . feeding supplement (PRO-STAT SUGAR FREE 64)  30 mL Oral BID  . ferrous sulfate  330 mg Oral Daily  . insulin aspart  0-15 Units Subcutaneous TID WC  . insulin glargine  10 Units Subcutaneous QHS  . linagliptin  5 mg Oral Daily  . memantine  10 mg Oral BID  . multivitamin with minerals  1 tablet Oral Daily  . pantoprazole  40 mg Oral Daily  . traMADol  50 mg Oral BID  . [START ON 12/26/2016] Vitamin D (Ergocalciferol)  50,000 Units Oral Q30 days   Continuous Infusions: . dextrose 5 % and 0.45 % NaCl with KCl 20 mEq/L 75 mL/hr at 12/18/16 2211  . magnesium sulfate 1 - 4 g bolus IVPB     PRN Meds: acetaminophen **OR** acetaminophen, alum & mag hydroxide-simeth, guaiFENesin-dextromethorphan, hydrALAZINE, HYDROmorphone (DILAUDID) injection, labetalol, magnesium sulfate 1 - 4 g bolus IVPB, metoprolol tartrate, metoprolol tartrate, ondansetron, oxyCODONE-acetaminophen, phenol, potassium chloride   Vital Signs    Vitals:   12/19/16 1338 12/19/16 2029 12/20/16 0253 12/20/16 0518  BP: 129/76 (!) 129/48 (!) 110/34 (!) 107/46  Pulse: (!) 116 69 70 (!) 56  Resp: 14 (!) 22 18 19   Temp: 97.6 F (36.4 C) 98.4 F (36.9 C) 98 F (36.7 C) 98 F (36.7 C)  TempSrc: Axillary Axillary Axillary Axillary  SpO2: 98% 96% 100% 100%  Weight:        Intake/Output Summary (Last 24 hours) at 12/20/16 1018 Last data filed at 12/20/16 0945  Gross  per 24 hour  Intake           1992.5 ml  Output              100 ml  Net           1892.5 ml   Filed Weights   12/18/16 2019  Weight: 125 lb 12.8 oz (57.1 kg)    Telemetry    Atrial fib with controlled rate - Personally Reviewed  ECG    NA - Personally Reviewed  Physical Exam   GEN: Aggitated.   Neck: No  JVD Cardiac: IrregularRR,  murmurs, rubs, or gallops.  Respiratory: Clear  to auscultation bilaterally. GI: Soft, nontender, non-distended  MS: No  edema; No deformity. Neuro:  Unable to assess Psych:  Agitated and demented.   Labs    Chemistry Recent Labs Lab 12/18/16 0747 12/18/16 2024 12/19/16 0453 12/20/16 0500  NA 139  --  138 139  K 3.4*  --  3.5 4.0  CL 104  --  104 107  CO2  --   --  24 26  GLUCOSE 184*  --  411* 82  BUN 15  --  12 9  CREATININE 0.60 0.77 0.83 0.57  CALCIUM  --   --  6.5* 6.5*  GFRNONAA  --  >60 >60 >  60  GFRAA  --  >60 >60 >60  ANIONGAP  --   --  10 6     Hematology Recent Labs Lab 12/18/16 2024 12/19/16 0453 12/20/16 0500  WBC 15.6* 10.2 9.1  RBC 3.62* 3.20* 3.25*  HGB 10.1* 9.1* 9.4*  HCT 32.2* 28.5* 28.9*  MCV 89.0 89.1 88.9  MCH 27.9 28.4 28.9  MCHC 31.4 31.9 32.5  RDW 14.7 15.2 15.3  PLT 533* 438* 423*    Cardiac EnzymesNo results for input(s): TROPONINI in the last 168 hours. No results for input(s): TROPIPOC in the last 168 hours.   BNPNo results for input(s): BNP, PROBNP in the last 168 hours.   DDimer No results for input(s): DDIMER in the last 168 hours.   Radiology    No results found.  Cardiac Studies   ECHO:  Study Conclusions  - Left ventricle: The cavity size was normal. Systolic function was   normal. The estimated ejection fraction was in the range of 55%   to 60%. Wall motion was normal; there were no regional wall   motion abnormalities. The study was not technically sufficient to   allow evaluation of LV diastolic dysfunction due to atrial   fibrillation. - Aortic valve: Poorly  visualized. There was mild to moderate   regurgitation. - Mitral valve: Calcified annulus. There was trivial regurgitation. - Tricuspid valve: There was trivial regurgitation.   Patient Profile     83 y.o. female w/ PMH of Alzheimer's disease, PVD, Type 2 DM, HLD, and history of breast cancerwho presented [facility] for planned Left AKA. Cardiology consulted for post-operative atrial fibrillation.  Assessment & Plan    ATRIAL FIB:  Rate has been OK (slow).  Thought to be high risk for anticoagulation.  Echo as above.  No change in therapy .  We will see as needed.  Moderate AI:  Follow clinically.     Signed, Minus Breeding, MD  12/20/2016, 10:18 AM

## 2016-12-20 NOTE — Progress Notes (Signed)
Hypoglycemic Event  CBG:56  Treatment: Glucose gel  Symptoms: None  Follow-up CBG: [id number] CBG Result:96  Possible Reasons for Event: Inadequate meal intake  Comments/MD notified:Dr. Garth Bigness, Arville Go

## 2016-12-20 NOTE — Progress Notes (Signed)
Notified MD Sherren Mocha Early patient has not urinated since 2100 this evening and bladder scan showed residual 120cc. Also notified patient's BP 110/34, HR 70, T 98.0, R 18. Order for 250cc NS bolus now and to continue to monitor patient.

## 2016-12-20 NOTE — Progress Notes (Signed)
Subjective: Interval History: none.. Agitated this morning. Her niece is present in reports this is worse than her baseline dementia.  Objective: Vital signs in last 24 hours: Temp:  [97.6 F (36.4 C)-98.4 F (36.9 C)] 98 F (36.7 C) (10/06 0518) Pulse Rate:  [56-116] 56 (10/06 0518) Resp:  [14-22] 19 (10/06 0518) BP: (107-129)/(34-76) 107/46 (10/06 0518) SpO2:  [96 %-100 %] 100 % (10/06 0518)  Intake/Output from previous day: 10/05 0701 - 10/06 0700 In: 1932.5 [I.V.:1932.5] Out: 100 [Urine:100] Intake/Output this shift: Total I/O In: 60 [P.O.:60] Out: -   No swelling in her left AKA stump. Dressing intact  Lab Results:  Recent Labs  12/19/16 0453 12/20/16 0500  WBC 10.2 9.1  HGB 9.1* 9.4*  HCT 28.5* 28.9*  PLT 438* 423*   BMET  Recent Labs  12/19/16 0453 12/20/16 0500  NA 138 139  K 3.5 4.0  CL 104 107  CO2 24 26  GLUCOSE 411* 82  BUN 12 9  CREATININE 0.83 0.57  CALCIUM 6.5* 6.5*    Studies/Results: No results found. Anti-infectives: Anti-infectives    Start     Dose/Rate Route Frequency Ordered Stop   12/18/16 2300  cefUROXime (ZINACEF) 1.5 g in dextrose 5 % 50 mL IVPB     1.5 g 100 mL/hr over 30 Minutes Intravenous Every 12 hours 12/18/16 1818 12/19/16 1315   12/18/16 0716  cefUROXime (ZINACEF) 1.5 g in dextrose 5 % 50 mL IVPB     1.5 g 100 mL/hr over 30 Minutes Intravenous 30 min pre-op 12/18/16 0716 12/18/16 1030      Assessment/Plan: s/p Procedure(s): AMPUTATION ABOVE KNEE LEFT (Left) Stable overall. Had hypoglycemia this morning which was treated. Also concern about low urine output last night and I gave a 250 cc normal saline bolus and she is good urine output currently. Will remove dressing and a check amputation stump tomorrow.   LOS: 2 days   Raeford Brandenburg 12/20/2016, 10:05 AM

## 2016-12-21 LAB — GLUCOSE, CAPILLARY
GLUCOSE-CAPILLARY: 129 mg/dL — AB (ref 65–99)
Glucose-Capillary: 74 mg/dL (ref 65–99)
Glucose-Capillary: 100 mg/dL — ABNORMAL HIGH (ref 65–99)
Glucose-Capillary: 92 mg/dL (ref 65–99)

## 2016-12-21 NOTE — Progress Notes (Signed)
Subjective: Interval History: none.. Agitated  Objective: Vital signs in last 24 hours: Temp:  [97.7 F (36.5 C)-98.4 F (36.9 C)] 97.7 F (36.5 C) (10/07 0612) Pulse Rate:  [70-119] 119 (10/07 0612) Resp:  [18-20] 20 (10/07 0612) BP: (95-107)/(50-61) 95/61 (10/07 0612) SpO2:  [97 %-100 %] 97 % (10/07 0612)  Intake/Output from previous day: 10/06 0701 - 10/07 0700 In: 1947.5 [P.O.:240; I.V.:1707.5] Out: 400 [Urine:400] Intake/Output this shift: No intake/output data recorded.  Dressing removed. Amputation site healing well and redressed  Lab Results:  Recent Labs  12/19/16 0453 12/20/16 0500  WBC 10.2 9.1  HGB 9.1* 9.4*  HCT 28.5* 28.9*  PLT 438* 423*   BMET  Recent Labs  12/19/16 0453 12/20/16 0500  NA 138 139  K 3.5 4.0  CL 104 107  CO2 24 26  GLUCOSE 411* 82  BUN 12 9  CREATININE 0.83 0.57  CALCIUM 6.5* 6.5*    Studies/Results: No results found. Anti-infectives: Anti-infectives    Start     Dose/Rate Route Frequency Ordered Stop   12/18/16 2300  cefUROXime (ZINACEF) 1.5 g in dextrose 5 % 50 mL IVPB     1.5 g 100 mL/hr over 30 Minutes Intravenous Every 12 hours 12/18/16 1818 12/19/16 1315   12/18/16 0716  cefUROXime (ZINACEF) 1.5 g in dextrose 5 % 50 mL IVPB     1.5 g 100 mL/hr over 30 Minutes Intravenous 30 min pre-op 12/18/16 0716 12/18/16 1030      Assessment/Plan: s/p Procedure(s): AMPUTATION ABOVE KNEE LEFT (Left) Stable overall. Plan return to skilled nursing tomorrow   LOS: 3 days   Jill Francis 12/21/2016, 10:04 AM

## 2016-12-22 ENCOUNTER — Telehealth: Payer: Self-pay | Admitting: Vascular Surgery

## 2016-12-22 LAB — GLUCOSE, CAPILLARY
GLUCOSE-CAPILLARY: 109 mg/dL — AB (ref 65–99)
GLUCOSE-CAPILLARY: 101 mg/dL — AB (ref 65–99)
Glucose-Capillary: 83 mg/dL (ref 65–99)
Glucose-Capillary: 109 mg/dL — ABNORMAL HIGH (ref 65–99)

## 2016-12-22 NOTE — Discharge Summary (Signed)
Discharge Summary    Jill Francis 1927/09/08 83 y.o. female  [id number]  Admission Date: 12/18/2016  Discharge Date: 12/23/2016  Physician: Angelia Mould, *  Admission Diagnosis: PVD with LLE gangrene I70.262   HPI:   This is a 83 y.o. female who I saw in consultation on 10/22/2016. The patient has a history of severe dementia and Alzheimer's disease and has been nonambulatory for 2-3 years. She fractured her left leg in May and was in a cast for 4 weeks. When this was taken off another cast was placed for another 4 weeks. When the second one was removed there were some blisters on her left leg which have been slow to heal. When I saw her in consultation on 10/22/2016 felt she was clearly not a candidate for any type of revascularization. A start her exam she had evidence of multilevel arterial occlusive disease. Toe pressure on the left was only 11 mmHg. She had monophasic Doppler signals in the posterior tibial artery only on the left with no dorsalis pedis signal. My feeling was, that if the wounds progressed, the options would be palliative care versus a left above-the-knee amputation. Of note she's previously had a left total knee replacement.  She is here today with her niece. It is difficult to determine if she's having any pain. She has severe dementia with Alzheimer's disease. The niece is unaware of any fever or significant drainage from the wounds.  Hospital Course:  The patient was admitted to the hospital and taken to the operating room on 12/18/2016 and underwent: Left above knee amputation.   The muscle appeared to be adequately perfused.  The pt tolerated the procedure well and was transported to the PACU in good condition.   She did have some post operative atrial fibrillation and cardiology was consulted.  Per cardiology: - she has no known cardiac history by review of records. Has documented PVD and had developed nonhealing wounds along her left leg,  resulting in the need for Left AKA. She tolerated the procedure well but went into atrial fibrillation while in the PACU.  - while she is in atrial fibrillation, HR is well-controlled in the 70's - 80's. She was not on any AV nodal blocking agents PTA. BP is soft after administration of pain medication, therefore will write for PRN IV Lopressor at this time. Continue to follow rhythm on telemetry. Will recheck BMET (K+ 3.4 this AM) along with Mg and TSH.  - This patients CHA2DS2-VASc Score and unadjusted Ischemic Stroke Rate (% per year) is equal to 7.2 % stroke rate/year from a score of 5 (DM, Vascular, Female, Age (2)). With no known history of atrial fibrillation and having just gone into the arrhythmia after surgery, would not start anticoagulation at this time. Is she remains in atrial fibrillation, will need to discuss risks and benefits of anticoagulation with the patient's family.    It was determined that with her baseline dementia and poor functional status, anticoagulation might not be the best option.  On POD 2, there was some concern about low UOP and was given a 250cc NS bolus and she had good UOP.    She did have a 2D echocardiogram: ECHO:  Study Conclusions  - Left ventricle: The cavity size was normal. Systolic function was normal. The estimated ejection fraction was in the range of 55% to 60%. Wall motion was normal; there were no regional wall motion abnormalities. The study was not technically sufficient to allow evaluation of LV diastolic  dysfunction due to atrial fibrillation. - Aortic valve: Poorly visualized. There was mild to moderate regurgitation. - Mitral valve: Calcified annulus. There was trivial regurgitation. - Tricuspid valve: There was trivial regurgitation.  Her bandage was removed and wound looked good.  She will transfer back to SNF on 12/23/16.  The remainder of the hospital course consisted of increasing mobilization and increasing intake  of solids without difficulty.  5 Days Post-Op s/p: Left AKA.  High risk for anticoagulation, so no plans for Coumadin.   Transfer back to SNF today.   Needs daily dressing changes: dry 4X4's, Kerlix, 4' ace.  I will see her back in 1 month for staple removal.   CBC    Component Value Date/Time   WBC 9.1 12/20/2016 0500   RBC 3.25 (L) 12/20/2016 0500   HGB 9.4 (L) 12/20/2016 0500   HGB 11.5 (L) 10/05/2008 1424   HCT 28.9 (L) 12/20/2016 0500   HCT 34.6 (L) 10/05/2008 1424   PLT 423 (H) 12/20/2016 0500   PLT 262 10/05/2008 1424   MCV 88.9 12/20/2016 0500   MCV 90.1 10/05/2008 1424   MCH 28.9 12/20/2016 0500   MCHC 32.5 12/20/2016 0500   RDW 15.3 12/20/2016 0500   RDW 14.5 10/05/2008 1424   LYMPHSABS 1.7 10/17/2016 2128   LYMPHSABS 1.4 10/05/2008 1424   MONOABS 0.8 10/17/2016 2128   MONOABS 0.3 10/05/2008 1424   EOSABS 0.0 10/17/2016 2128   EOSABS 0.1 10/05/2008 1424   BASOSABS 0.0 10/17/2016 2128   BASOSABS 0.0 10/05/2008 1424    BMET    Component Value Date/Time   NA 139 12/20/2016 0500   NA 147 06/16/2016   K 4.0 12/20/2016 0500   CL 107 12/20/2016 0500   CO2 26 12/20/2016 0500   GLUCOSE 82 12/20/2016 0500   BUN 9 12/20/2016 0500   BUN 20 06/16/2016   CREATININE 0.57 12/20/2016 0500   CALCIUM 6.5 (L) 12/20/2016 0500   GFRNONAA >60 12/20/2016 0500   GFRAA >60 12/20/2016 0500      Discharge Instructions    Call MD for:  redness, tenderness, or signs of infection (pain, swelling, bleeding, redness, odor or green/yellow discharge around incision site)    Complete by:  As directed    Call MD for:  severe or increased pain, loss or decreased feeling  in affected limb(s)    Complete by:  As directed    Call MD for:  temperature >100.5    Complete by:  As directed    Discharge wound care:    Complete by:  As directed    Shower daily with soap and water.   Driving Restrictions    Complete by:  As directed    No driving for 2 weeks & while taking pain  medication.   Resume previous diet    Complete by:  As directed       Discharge Diagnosis:  PVD with LLE gangrene I70.262  Secondary Diagnosis: Patient Active Problem List   Diagnosis Date Noted  . Gangrene of left foot ([place]) 12/18/2016  . Breast cancer ([place])   . Osteoporosis 06/23/2015  . Bradycardia 09/18/2014  . Prolonged QT interval 09/18/2014  . Hypocalcemia 05/27/2014  . FTT (failure to thrive) in adult 05/27/2014  . Dysphagia, oral phase 04/26/2014  . CKD (chronic kidney disease) stage 2, GFR 60-89 ml/min 04/26/2014  . Type 2 diabetes mellitus without complication ([place]) 26/71/2458  . Hyperlipidemia   . Chronic renal disease, stage 2, mildly decreased glomerular filtration rate  between 60-89 mL/min/1.73 square meter 10/14/2013  . Cough 06/29/2013  . Hypoglycemia 01/16/2013  . Psychosis ([place]) 09/20/2012  . Diabetes mellitus type 2, uncontrolled, without complications ([place]) 47/42/5956  . Dementia with behavioral disturbance 08/19/2012  . Iron deficiency anemia 08/19/2012  . Depression 08/19/2012  . Malignant neoplasm of female breast ([place]) 06/30/2008  . ADENOCARCINOMA, OVARY 06/29/2008  . Anxiety state 01/16/2007   Past Medical History:  Diagnosis Date  . Allergic rhinitis, unspecified   . Alzheimer's disease 08/19/2012  . Anemia in other chronic diseases classified elsewhere   . Asthma   . Breast cancer ([place])   . Cancer [facility])    Skin cancer  . Depression   . Diabetes mellitus   . Hyperlipidemia   . Hypocalcemia   . Ovarian cancer ([place])   . Personal history of malignant neoplasm of ovary   . Type II or unspecified type diabetes mellitus without mention of complication, uncontrolled 08/19/2012     Allergies as of 12/22/2016      Reactions   Gabapentin    UNSPECIFIED REACTION    Iodine    UNSPECIFIED REACTION    Sulfonamide Derivatives    UNSPECIFIED REACTION       Medication List    TAKE these medications   acetaminophen 500 MG tablet Commonly known  as:  TYLENOL Take 1,000 mg by mouth 3 (three) times daily. (0900, 1400, & 1900)   calcitonin (salmon) 200 UNIT/ACT nasal spray Commonly known as:  MIACALCIN/FORTICAL Place 1 spray into alternate nostrils daily. (0900)   calcium-vitamin D 500-200 MG-UNIT tablet Commonly known as:  OSCAL WITH D Take 1 tablet by mouth 3 (three) times daily with meals.   DECUBI-VITE PO Take 1 tablet by mouth daily. (0900)   donepezil 10 MG tablet Commonly known as:  ARICEPT Take 10 mg by mouth at bedtime. (2100) For dementia   ferrous sulfate 220 (44 Fe) MG/5ML solution Take 330 mg by mouth daily. (0900) 7.5 ml - 330 mg   memantine 10 MG tablet Commonly known as:  NAMENDA Take 10 mg by mouth 2 (two) times daily. (0900 & 2100) For dementia   NUTRITIONAL SUPPLEMENT PO Take 120 mLs by mouth 3 (three) times daily. Med Pass 2.0 (0800, 1300, & 1800)   sitaGLIPtin 100 MG tablet Commonly known as:  JANUVIA Take 100 mg by mouth at bedtime. (2100)   TOUJEO SOLOSTAR 300 UNIT/ML Sopn Generic drug:  Insulin Glargine Inject 10 Units into the skin at bedtime. (2100)   traMADol 50 MG tablet Commonly known as:  ULTRAM Take 50 mg by mouth 2 (two) times daily. (0900 & 2100)   Vitamin D (Ergocalciferol) 50000 units Caps capsule Commonly known as:  DRISDOL Take 50,000 Units by mouth every 30 (thirty) days.            Discharge Care Instructions        Start     Ordered   12/22/16 0000  Discharge wound care:    Comments:  Shower daily with soap and water.   12/22/16 1434      Prescriptions given: Oxycodone 5/325 #20 1-2 q 6 PRN pain  Instructions: 1.  No driving for 2 weeks and while taking pain medication 2.  No heavy lifting x 1 week 3.  Shower daily starting 12/23/16  Disposition: SNF  Patient's condition: is Good  Follow up: 1. Dr. Donnetta Hutching in 4 weeks (01/21/17 @ 1015)   Leontine Locket, PA-C [facility] [phone] 12/22/2016  2:35 PM

## 2016-12-22 NOTE — Telephone Encounter (Signed)
Sched appt 01/21/17 at 10:15. Spoke w/ Abigail Butts at [address] and lm on hm#.

## 2016-12-22 NOTE — Telephone Encounter (Signed)
-----   Message from Mena Goes, RN sent at 12/22/2016  9:57 AM EDT ----- Regarding: 4 weeks for staple removal Left AKA   ----- Message ----- From: Gabriel Earing, PA-C Sent: 12/22/2016   9:24 AM To: [facility] Charge Pool  S/p left AKA.  F/u with [facility] in 4 weeks.  Thanks

## 2016-12-22 NOTE — Progress Notes (Signed)
   VASCULAR SURGERY ASSESSMENT & PLAN:   4 Days Post-Op s/p: left AKA.  High risk for anticoagulation, so no plans for Coumadin.   Transfer back to SNF tomorrow.    I will see her back in 1 month for staple removal.    SUBJECTIVE:   Mental status at baseline  PHYSICAL EXAM:   Vitals:   12/21/16 0612 12/21/16 1410 12/21/16 2159 12/22/16 0555  BP: 95/61 100/65 98/62 121/78  Pulse: (!) 119 (!) 118 (!) 112 87  Resp: 20 18 16 17   Temp: 97.7 F (36.5 C) (!) 97 F (36.1 C) 97.6 F (36.4 C) 97.7 F (36.5 C)  TempSrc: Axillary Axillary Axillary   SpO2: 97% 98% 97% 98%  Weight:       Left AKA inspected and looks good.  LABS:   Lab Results  Component Value Date   WBC 9.1 12/20/2016   HGB 9.4 (L) 12/20/2016   HCT 28.9 (L) 12/20/2016   MCV 88.9 12/20/2016   PLT 423 (H) 12/20/2016   Lab Results  Component Value Date   CREATININE 0.57 12/20/2016   Lab Results  Component Value Date   INR 1.14 09/10/2014   CBG (last 3)   Recent Labs  12/21/16 1702 12/21/16 2131 12/22/16 0817  GLUCAP 92 129* 83    PROBLEM LIST:    Active Problems:   Gangrene of left foot ([facility])   CURRENT MEDS:   . acetaminophen  1,000 mg Oral TID  . calcitonin (salmon)  1 spray Alternating Nares Daily  . calcium-vitamin D  1 tablet Oral TID WC  . docusate sodium  100 mg Oral Daily  . donepezil  10 mg Oral QHS  . enoxaparin (LOVENOX) injection  40 mg Subcutaneous Q24H  . feeding supplement (ENSURE ENLIVE)  237 mL Oral BID BM  . feeding supplement (PRO-STAT SUGAR FREE 64)  30 mL Oral BID  . ferrous sulfate  330 mg Oral Daily  . insulin aspart  0-15 Units Subcutaneous TID WC  . insulin glargine  10 Units Subcutaneous QHS  . linagliptin  5 mg Oral Daily  . memantine  10 mg Oral BID  . multivitamin with minerals  1 tablet Oral Daily  . pantoprazole  40 mg Oral Daily  . traMADol  50 mg Oral BID  . [START ON 12/26/2016] Vitamin D (Ergocalciferol)  50,000 Units Oral Q30 days    Gae Gallop Beeper: [phone] Office: [phone] 12/22/2016

## 2016-12-22 NOTE — Progress Notes (Signed)
Orthopedic Tech Progress Note Patient Details:  Jill Francis 01-08-1928 [id number]  Patient ID: [name], female   DOB: Mar 27, 1927, 83 y.o.   MRN: [id number]   Hildred Priest 12/22/2016, 10:27 AM Called in bio-tech brace order; spoke with [facility]

## 2016-12-22 NOTE — Care Management Important Message (Signed)
Important Message  Patient Details  Name: Jill Francis MRN: [id number] Date of Birth: 1928/03/12   Medicare Important Message Given:  Yes    Nathen May 12/22/2016, 9:17 AM

## 2016-12-23 LAB — GLUCOSE, CAPILLARY
GLUCOSE-CAPILLARY: 78 mg/dL (ref 65–99)
Glucose-Capillary: 72 mg/dL (ref 65–99)

## 2016-12-23 MED ORDER — OXYCODONE-ACETAMINOPHEN 5-325 MG PO TABS: 1.0000 | ORAL_TABLET | Freq: Four times a day (QID) | ORAL | 0 refills | Status: DC | PRN

## 2016-12-23 NOTE — Clinical Social Work Note (Signed)
Clinical Social Work Assessment  Patient Details  Name: Jill Francis MRN: [id number] Date of Birth: June 14, 1927  Date of referral:  12/23/16               Reason for consult:  Facility Placement                Permission sought to share information with:  Facility Sport and exercise psychologist, Family Supports Permission granted to share information::  Yes, Verbal Permission Granted  Name::     Chartered certified accountant::  [facility]  Relationship::  Niece  Sport and exercise psychologist Information:     Housing/Transportation Living arrangements for the past 2 months:  [place] of Information:  Other (Comment Required) (Niece) Patient Interpreter Needed:  None Criminal Activity/Legal Involvement Pertinent to Current Situation/Hospitalization:  No - Comment as needed Significant Relationships:  Other Family Members Lives with:  Self, Facility Resident Do you feel safe going back to the place where you live?  Yes Need for family participation in patient care:  Yes (Comment) (patient has dementia)  Care giving concerns:  Patient has been residing at [facility] and family has no concerns about care received there.   Social Worker assessment / plan:  CSW met with patient's niece at bedside to discuss plan to return to [facility]. CSW alerted RN that patient's niece had questions about care and would like to speak with the doctor. CSW contacted [facility] to confirm that facility was ready to accept patient back. CSW to follow to facilitate discharge back to SNF.  Employment status:  Retired Nurse, adult PT Recommendations:  Not assessed at this time Information / Referral to community resources:     Patient/Family's Response to care:  Patient's response unable to be assessed. Patient's niece is agreeable to return to SNF.  Patient/Family's Understanding of and Emotional Response to Diagnosis, Current Treatment, and Prognosis:  Patient's response unable to be assessed.  Patient's niece was wondering about follow up with the wound for the patient, as well as if she would be able to get up into the wheelchair so that the facility didn't leave her laying in the bed all day. Patient's niece was appreciative of CSW assistance.  Emotional Assessment Appearance:  Appears stated age Attitude/Demeanor/Rapport:  Unable to Assess Affect (typically observed):  Unable to Assess Orientation:  Oriented to Self Alcohol / Substance use:  Not Applicable Psych involvement (Current and /or in the community):  No (Comment)  Discharge Needs  Concerns to be addressed:  Care Coordination Readmission within the last 30 days:  No Current discharge risk:  Physical Impairment, Cognitively Impaired Barriers to Discharge:  No Barriers Identified   Geralynn Ochs, LCSW 12/23/2016, 3:55 PM

## 2016-12-23 NOTE — NC FL2 (Signed)
[place] MEDICAID FL2 LEVEL OF CARE SCREENING TOOL     IDENTIFICATION  Patient Name: Jill Francis Birthdate: 20-Jun-1927 Sex: female Admission Date (Current Location): 12/18/2016  [facility] and [place] Number:  Herbalist and Address:  [facility] [place] [address] [place], [place] [address]      Provider Number: [id number]  Attending Physician Name and Address:  Angelia Mould, *  Relative Name and Phone Number:       Current Level of Care: Hospital Recommended Level of Care: [place] Prior Approval Number:    Date Approved/Denied:   PASRR Number: [id number]  Discharge Plan: SNF    Current Diagnoses: Patient Active Problem List   Diagnosis Date Noted  . Gangrene of left foot ([place]) 12/18/2016  . Breast cancer ([place])   . Osteoporosis 06/23/2015  . Bradycardia 09/18/2014  . Prolonged QT interval 09/18/2014  . Hypocalcemia 05/27/2014  . FTT (failure to thrive) in adult 05/27/2014  . Dysphagia, oral phase 04/26/2014  . CKD (chronic kidney disease) stage 2, GFR 60-89 ml/min 04/26/2014  . Type 2 diabetes mellitus without complication ([place]) 17/61/6073  . Hyperlipidemia   . Chronic renal disease, stage 2, mildly decreased glomerular filtration rate between 60-89 mL/min/1.73 square meter 10/14/2013  . Cough 06/29/2013  . Hypoglycemia 01/16/2013  . Psychosis ([place]) 09/20/2012  . Diabetes mellitus type 2, uncontrolled, without complications ([place]) 71/08/2692  . Dementia with behavioral disturbance 08/19/2012  . Iron deficiency anemia 08/19/2012  . Depression 08/19/2012  . Malignant neoplasm of female breast ([place]) 06/30/2008  . ADENOCARCINOMA, OVARY 06/29/2008  . Anxiety state 01/16/2007    Orientation RESPIRATION BLADDER Height & Weight     Self  Normal Incontinent, External catheter (placed 12/19/16) Weight: 125 lb 12.8 oz (57.1 kg) Height:     BEHAVIORAL SYMPTOMS/MOOD NEUROLOGICAL BOWEL NUTRITION STATUS   Incontinent Diet (dysphagia 1)  AMBULATORY STATUS COMMUNICATION OF NEEDS Skin   Total Care Verbally Surgical wounds (closed left leg incision, 12/18/16, no dressing)                       Personal Care Assistance Level of Assistance  Bathing, Feeding, Dressing, Total care Bathing Assistance: Maximum assistance Feeding assistance: Maximum assistance Dressing Assistance: Maximum assistance Total Care Assistance: Maximum assistance   Functional Limitations Info             SPECIAL CARE FACTORS FREQUENCY                       Contractures      Additional Factors Info  Code Status, Allergies, Insulin Sliding Scale Code Status Info: full Allergies Info: Gabapentin, Iodine, Sulfonamide Derivatives   Insulin Sliding Scale Info: Insulin 0-15 units, 3x/day with meals; lantus injection 10 units daily at bedtime       Current Medications (12/23/2016):  This is the current hospital active medication list Current Facility-Administered Medications  Medication Dose Route Frequency Provider Last Rate Last Dose  . acetaminophen (TYLENOL) tablet 325-650 mg  325-650 mg Oral Q4H PRN Angelia Mould, MD       Or  . acetaminophen (TYLENOL) suppository 325-650 mg  325-650 mg Rectal Q4H PRN Angelia Mould, MD      . acetaminophen (TYLENOL) tablet 1,000 mg  1,000 mg Oral TID Angelia Mould, MD   1,000 mg at 12/23/16 1023  . alum & mag hydroxide-simeth (MAALOX/MYLANTA) 200-200-20 MG/5ML suspension 15-30 mL  15-30 mL Oral  Q2H PRN Angelia Mould, MD      . calcitonin (salmon) (MIACALCIN/FORTICAL) nasal spray 1 spray  1 spray Alternating Nares Daily Angelia Mould, MD   1 spray at 12/22/16 1000  . calcium-vitamin D (OSCAL WITH D) 500-200 MG-UNIT per tablet 1 tablet  1 tablet Oral TID WC Angelia Mould, MD   1 tablet at 12/23/16 1024  . dextrose 5 % and 0.45 % NaCl with KCl 20 mEq/L infusion   Intravenous Continuous Angelia Mould, MD 75  mL/hr at 12/23/16 [phone]    . docusate sodium (COLACE) capsule 100 mg  100 mg Oral Daily Angelia Mould, MD   100 mg at 12/23/16 1025  . donepezil (ARICEPT) tablet 10 mg  10 mg Oral QHS Angelia Mould, MD   10 mg at 12/22/16 2143  . enoxaparin (LOVENOX) injection 40 mg  40 mg Subcutaneous Q24H Angelia Mould, MD   40 mg at 12/23/16 1022  . feeding supplement (ENSURE ENLIVE) (ENSURE ENLIVE) liquid 237 mL  237 mL Oral BID BM Angelia Mould, MD   237 mL at 12/23/16 1022  . feeding supplement (PRO-STAT SUGAR FREE 64) liquid 30 mL  30 mL Oral BID Angelia Mould, MD   30 mL at 12/23/16 1025  . ferrous sulfate 220 (44 Fe) MG/5ML solution 330 mg  330 mg Oral Daily Angelia Mould, MD   330 mg at 12/23/16 1022  . guaiFENesin-dextromethorphan (ROBITUSSIN DM) 100-10 MG/5ML syrup 15 mL  15 mL Oral Q4H PRN Angelia Mould, MD      . hydrALAZINE (APRESOLINE) injection 5 mg  5 mg Intravenous Q20 Min PRN Angelia Mould, MD      . HYDROmorphone (DILAUDID) injection 0.5-1 mg  0.5-1 mg Intravenous Q2H PRN Angelia Mould, MD   1 mg at 12/19/16 2225  . insulin aspart (novoLOG) injection 0-15 Units  0-15 Units Subcutaneous TID WC Angelia Mould, MD   5 Units at 12/19/16 [phone]  . insulin glargine (LANTUS) injection 10 Units  10 Units Subcutaneous QHS Angelia Mould, MD   10 Units at 12/22/16 2144  . labetalol (NORMODYNE,TRANDATE) injection 10 mg  10 mg Intravenous Q10 min PRN Angelia Mould, MD      . linagliptin (TRADJENTA) tablet 5 mg  5 mg Oral Daily Angelia Mould, MD   5 mg at 12/23/16 1025  . magnesium sulfate IVPB 2 g 50 mL  2 g Intravenous Once PRN Angelia Mould, MD      . memantine [facility]) tablet 10 mg  10 mg Oral BID Angelia Mould, MD   10 mg at 12/23/16 1024  . metoprolol tartrate (LOPRESSOR) injection 2-5 mg  2-5 mg Intravenous Q2H PRN Angelia Mould, MD      . metoprolol tartrate  (LOPRESSOR) injection 5 mg  5 mg Intravenous Q6H PRN Ahmed Prima, [place] M, PA-C      . multivitamin with minerals tablet 1 tablet  1 tablet Oral Daily Angelia Mould, MD   1 tablet at 12/23/16 1023  . ondansetron (ZOFRAN) injection 4 mg  4 mg Intravenous Q6H PRN Angelia Mould, MD      . oxyCODONE-acetaminophen (PERCOCET/ROXICET) 5-325 MG per tablet 1-2 tablet  1-2 tablet Oral Q4H PRN Angelia Mould, MD   2 tablet at 12/23/16 0232  . pantoprazole (PROTONIX) EC tablet 40 mg  40 mg Oral Daily Angelia Mould, MD   40 mg at 12/23/16 1024  .  phenol (CHLORASEPTIC) mouth spray 1 spray  1 spray Mouth/Throat PRN Angelia Mould, MD      . potassium chloride SA (K-DUR,KLOR-CON) CR tablet 20-40 mEq  20-40 mEq Oral Once PRN Angelia Mould, MD      . traMADol Veatrice Bourbon) tablet 50 mg  50 mg Oral BID Angelia Mould, MD   50 mg at 12/23/16 1023  . [START ON 12/26/2016] Vitamin D (Ergocalciferol) (DRISDOL) capsule 50,000 Units  50,000 Units Oral Q30 days Angelia Mould, MD         Discharge Medications: Please see discharge summary for a list of discharge medications.  Relevant Imaging Results:  Relevant Lab Results:   Additional Information SS#: [id number]  Geralynn Ochs, LCSW

## 2016-12-23 NOTE — Progress Notes (Signed)
   VASCULAR SURGERY ASSESSMENT & PLAN:   5 Days Post-Op s/p: Left AKA.  High risk for anticoagulation, so no plans for Coumadin.   Transfer back to SNF today.    Needs daily dressing changes: dry 4X4's, Kerlix, 4' ace.  I will see her back in 1 month for staple removal.    SUBJECTIVE:   Mental status at baseline  PHYSICAL EXAM:   Vitals:   12/22/16 0555 12/22/16 1536 12/22/16 2042 12/23/16 0449  BP: 121/78 112/74 (!) 126/95 (!) 150/54  Pulse: 87 92 67 83  Resp: 17 16  16   Temp: 97.7 F (36.5 C) 98 F (36.7 C) 98 F (36.7 C) 98.1 F (36.7 C)  TempSrc:  Oral Axillary Axillary  SpO2: 98% 99% 92% 96%  Weight:       Left AKA looks fine  LABS:   Lab Results  Component Value Date   WBC 9.1 12/20/2016   HGB 9.4 (L) 12/20/2016   HCT 28.9 (L) 12/20/2016   MCV 88.9 12/20/2016   PLT 423 (H) 12/20/2016   Lab Results  Component Value Date   CREATININE 0.57 12/20/2016   Lab Results  Component Value Date   INR 1.14 09/10/2014   CBG (last 3)   Recent Labs  12/22/16 1207 12/22/16 1735 12/22/16 2039  GLUCAP 109* 101* 109*    PROBLEM LIST:    Active Problems:   Gangrene of left foot (H[facility])   CURRENT MEDS:   . acetaminophen  1,000 mg Oral TID  . calcitonin (salmon)  1 spray Alternating Nares Daily  . calcium-vitamin D  1 tablet Oral TID WC  . docusate sodium  100 mg Oral Daily  . donepezil  10 mg Oral QHS  . enoxaparin (LOVENOX) injection  40 mg Subcutaneous Q24H  . feeding supplement (ENSURE ENLIVE)  237 mL Oral BID BM  . feeding supplement (PRO-STAT SUGAR FREE 64)  30 mL Oral BID  . ferrous sulfate  330 mg Oral Daily  . insulin aspart  0-15 Units Subcutaneous TID WC  . insulin glargine  10 Units Subcutaneous QHS  . linagliptin  5 mg Oral Daily  . memantine  10 mg Oral BID  . multivitamin with minerals  1 tablet Oral Daily  . pantoprazole  40 mg Oral Daily  . traMADol  50 mg Oral BID  . [START ON 12/26/2016] Vitamin D (Ergocalciferol)  50,000  Units Oral Q30 days    Gae Gallop Beeper: [phone] Office: [phone] 12/23/2016

## 2016-12-23 NOTE — Progress Notes (Signed)
Discharge to: [place] Anticipated discharge date: 12/23/16 Family notified: Yes, at bedside Transportation by: [facility]  Report #: [phone], Room [address]B  [place] signing off.  Laveda Abbe LCSW [phone]

## 2016-12-23 NOTE — Progress Notes (Signed)
Report called to [place] LPN at [facility] [phone].  Patient transported to SNF via [place].

## 2016-12-24 ENCOUNTER — Telehealth: Payer: Self-pay

## 2016-12-24 NOTE — Telephone Encounter (Signed)
Possible re-admission to facility. This is a patient you were seeing at [facility]. [facility] F/U is needed if patient was re-admitted to facility upon discharge. Hospital discharge from [facility] on 12/23/16.

## 2017-01-21 ENCOUNTER — Ambulatory Visit (INDEPENDENT_AMBULATORY_CARE_PROVIDER_SITE_OTHER): Payer: Medicare Other | Admitting: Vascular Surgery

## 2017-01-21 ENCOUNTER — Encounter: Payer: Self-pay | Admitting: Vascular Surgery

## 2017-01-21 VITALS — BP 134/80 | HR 71 | Temp 97.5°F | Resp 16 | Ht 64.0 in | Wt 125.0 lb

## 2017-01-21 DIAGNOSIS — I70299 Other atherosclerosis of native arteries of extremities, unspecified extremity: Secondary | ICD-10-CM

## 2017-01-21 DIAGNOSIS — Z48812 Encounter for surgical aftercare following surgery on the circulatory system: Secondary | ICD-10-CM

## 2017-01-21 DIAGNOSIS — L97909 Non-pressure chronic ulcer of unspecified part of unspecified lower leg with unspecified severity: Secondary | ICD-10-CM

## 2017-01-21 NOTE — Progress Notes (Signed)
Patient name: Jill Francis MRN: [id number] DOB: [birth year] Sex: female  REASON FOR VISIT:   Follow-up after left above-the-knee amputation.  HPI:   LEXEE BRASHEARS is a pleasant 83 y.o. female who is nonambulatory and has significant Alzheimer's dementia.  She presented with nonhealing wounds of the left leg and was not a candidate for revascularization.  She underwent left above-the-knee amputation on 12/18/2016.  She returns for staple removal.  She has no complaints.  She has no significant pain in her stump.  Current Outpatient Medications  Medication Sig Dispense Refill  . acetaminophen (TYLENOL) 500 MG tablet Take 1,000 mg by mouth 3 (three) times daily. (0900, 1400, & 1900)    . calcitonin, salmon, (MIACALCIN/FORTICAL) 200 UNIT/ACT nasal spray Place 1 spray into alternate nostrils daily. (0900)    . calcium-vitamin D (OSCAL WITH D) 500-200 MG-UNIT per tablet Take 1 tablet by mouth 3 (three) times daily with meals.     . donepezil (ARICEPT) 10 MG tablet Take 10 mg by mouth at bedtime. (2100) For dementia    . ferrous sulfate 220 (44 Fe) MG/5ML solution Take 330 mg by mouth daily. (0900) 7.5 ml - 330 mg    . Insulin Glargine (TOUJEO SOLOSTAR) 300 UNIT/ML SOPN Inject 10 Units into the skin at bedtime. (2100)    . memantine (NAMENDA) 10 MG tablet Take 10 mg by mouth 2 (two) times daily. (0900 & 2100) For dementia    . Multiple Vitamins-Minerals (DECUBI-VITE PO) Take 1 tablet by mouth daily. (0900)    . Nutritional Supplements (NUTRITIONAL SUPPLEMENT PO) Take 120 mLs by mouth 3 (three) times daily. Med Pass 2.0 (0800, 1300, & 1800)    . oxyCODONE-acetaminophen (PERCOCET/ROXICET) 5-325 MG tablet Take 1-2 tablets by mouth every 6 (six) hours as needed for moderate pain. 20 tablet 0  . sitaGLIPtin (JANUVIA) 100 MG tablet Take 100 mg by mouth at bedtime. (2100)    . traMADol (ULTRAM) 50 MG tablet Take 50 mg by mouth 2 (two) times daily. (0900 & 2100)    . Vitamin D, Ergocalciferol,  (DRISDOL) 50000 units CAPS capsule Take 50,000 Units by mouth every 30 (thirty) days.     No current facility-administered medications for this visit.     REVIEW OF SYSTEMS:  [X]  denotes positive finding, [ ]  denotes negative finding Cardiac  Comments:  Chest pain or chest pressure:    Shortness of breath upon exertion:    Short of breath when lying flat:    Irregular heart rhythm:    Constitutional    Fever or chills:     PHYSICAL EXAM:   Vitals:   01/21/17 1020  BP: 134/80  Pulse: 71  Resp: 16  Temp: (!) 97.5 F (36.4 C)  TempSrc: Oral  SpO2: 99%  Weight: 125 lb (56.7 kg)  Height: 5\' 4"  (1.626 m)    GENERAL: The patient is a well-nourished female, in no acute distress. The vital signs are documented above. CARDIOVASCULAR: There is a regular rate and rhythm. PULMONARY: There is good air exchange bilaterally without wheezing or rales. Her left AKA is healing nicely. She has a small wound over her right anterior foot that is about 5 mm in diameter.  DATA:   No new data.  MEDICAL ISSUES:   STATUS POST LEFT AKA: Patient is doing well status post left AKA.  We removed her staples in the office today.  We put Steri-Strips on.  I have instructed the nursing facility to keep bacitracin and a  Band-Aid over the small wound on the dorsum of her right foot.  I will see her back as needed.  [name] [facility] [phone]

## 2017-07-13 ENCOUNTER — Non-Acute Institutional Stay: Payer: Self-pay | Admitting: Hospice and Palliative Medicine

## 2017-11-17 ENCOUNTER — Emergency Department (HOSPITAL_COMMUNITY): Payer: Medicare Other

## 2017-11-17 ENCOUNTER — Other Ambulatory Visit: Payer: Self-pay

## 2017-11-17 ENCOUNTER — Encounter (HOSPITAL_COMMUNITY): Payer: Self-pay

## 2017-11-17 ENCOUNTER — Inpatient Hospital Stay (HOSPITAL_COMMUNITY)
Admission: EM | Admit: 2017-11-17 | Discharge: 2017-11-21 | DRG: 641 | Disposition: A | Discharge status: Skilled Nursing Facility | Payer: Medicare Other | Source: Skilled Nursing Facility | Attending: Family Medicine | Admitting: Family Medicine

## 2017-11-17 DIAGNOSIS — E861 Hypovolemia: Secondary | ICD-10-CM | POA: Diagnosis present

## 2017-11-17 DIAGNOSIS — Z803 Family history of malignant neoplasm of breast: Secondary | ICD-10-CM

## 2017-11-17 DIAGNOSIS — Z87891 Personal history of nicotine dependence: Secondary | ICD-10-CM

## 2017-11-17 DIAGNOSIS — Z794 Long term (current) use of insulin: Secondary | ICD-10-CM | POA: Diagnosis not present

## 2017-11-17 DIAGNOSIS — R627 Adult failure to thrive: Secondary | ICD-10-CM | POA: Diagnosis not present

## 2017-11-17 DIAGNOSIS — Z515 Encounter for palliative care: Secondary | ICD-10-CM

## 2017-11-17 DIAGNOSIS — Z853 Personal history of malignant neoplasm of breast: Secondary | ICD-10-CM | POA: Diagnosis not present

## 2017-11-17 DIAGNOSIS — E1165 Type 2 diabetes mellitus with hyperglycemia: Secondary | ICD-10-CM | POA: Diagnosis present

## 2017-11-17 DIAGNOSIS — E876 Hypokalemia: Secondary | ICD-10-CM | POA: Diagnosis not present

## 2017-11-17 DIAGNOSIS — E1159 Type 2 diabetes mellitus with other circulatory complications: Secondary | ICD-10-CM | POA: Diagnosis not present

## 2017-11-17 DIAGNOSIS — Z8543 Personal history of malignant neoplasm of ovary: Secondary | ICD-10-CM

## 2017-11-17 DIAGNOSIS — F0150 Vascular dementia without behavioral disturbance: Secondary | ICD-10-CM

## 2017-11-17 DIAGNOSIS — Z6820 Body mass index (BMI) 20.0-20.9, adult: Secondary | ICD-10-CM | POA: Diagnosis not present

## 2017-11-17 DIAGNOSIS — Z66 Do not resuscitate: Secondary | ICD-10-CM | POA: Diagnosis present

## 2017-11-17 DIAGNOSIS — Z85828 Personal history of other malignant neoplasm of skin: Secondary | ICD-10-CM | POA: Diagnosis not present

## 2017-11-17 DIAGNOSIS — E860 Dehydration: Secondary | ICD-10-CM

## 2017-11-17 DIAGNOSIS — Z7189 Other specified counseling: Secondary | ICD-10-CM | POA: Diagnosis not present

## 2017-11-17 DIAGNOSIS — Z89612 Acquired absence of left leg above knee: Secondary | ICD-10-CM

## 2017-11-17 DIAGNOSIS — Z833 Family history of diabetes mellitus: Secondary | ICD-10-CM | POA: Diagnosis not present

## 2017-11-17 DIAGNOSIS — E785 Hyperlipidemia, unspecified: Secondary | ICD-10-CM | POA: Diagnosis present

## 2017-11-17 DIAGNOSIS — R1310 Dysphagia, unspecified: Secondary | ICD-10-CM | POA: Diagnosis present

## 2017-11-17 DIAGNOSIS — E870 Hyperosmolality and hypernatremia: Principal | ICD-10-CM | POA: Diagnosis present

## 2017-11-17 DIAGNOSIS — I482 Chronic atrial fibrillation: Secondary | ICD-10-CM | POA: Diagnosis present

## 2017-11-17 DIAGNOSIS — E440 Moderate protein-calorie malnutrition: Secondary | ICD-10-CM

## 2017-11-17 DIAGNOSIS — E1151 Type 2 diabetes mellitus with diabetic peripheral angiopathy without gangrene: Secondary | ICD-10-CM | POA: Diagnosis present

## 2017-11-17 HISTORY — DX: Hyperosmolality and hypernatremia: E87.0

## 2017-11-17 HISTORY — DX: Dehydration: E86.0

## 2017-11-17 LAB — COMPREHENSIVE METABOLIC PANEL
ALBUMIN: 2.6 g/dL — AB (ref 3.5–5.0)
ALK PHOS: 66 U/L (ref 38–126)
ALT: 17 U/L (ref 0–44)
AST: 18 U/L (ref 15–41)
BUN: 26 mg/dL — ABNORMAL HIGH (ref 8–23)
CALCIUM: 8.3 mg/dL — AB (ref 8.9–10.3)
CO2: 26 mmol/L (ref 22–32)
CREATININE: 0.94 mg/dL (ref 0.44–1.00)
GFR calc Af Amer: >60 mL/min (ref >60)
GFR calc non Af Amer: 53 mL/min — ABNORMAL LOW (ref >60)
GLUCOSE: 194 mg/dL — AB (ref 70–99)
Potassium: 3.7 mmol/L (ref 3.5–5.1)
Sodium: 164 mmol/L (ref 135–145)
Total Bilirubin: 0.3 mg/dL (ref 0.3–1.2)
Total Protein: 6.2 g/dL — ABNORMAL LOW (ref 6.5–8.1)

## 2017-11-17 LAB — BASIC METABOLIC PANEL
BUN: 25 mg/dL — AB (ref 8–23)
BUN: 25 mg/dL — AB (ref 8–23)
CO2: 26 mmol/L (ref 22–32)
CO2: 23 mmol/L (ref 22–32)
CREATININE: 0.81 mg/dL (ref 0.44–1.00)
CREATININE: 0.81 mg/dL (ref 0.44–1.00)
Calcium: 7.9 mg/dL — ABNORMAL LOW (ref 8.9–10.3)
Calcium: 8.1 mg/dL — ABNORMAL LOW (ref 8.9–10.3)
GFR calc Af Amer: >60 mL/min (ref >60)
GFR calc Af Amer: >60 mL/min (ref >60)
GFR calc non Af Amer: >60 mL/min (ref >60)
GLUCOSE: 144 mg/dL — AB (ref 70–99)
Glucose, Bld: 315 mg/dL — ABNORMAL HIGH (ref 70–99)
POTASSIUM: 3.8 mmol/L (ref 3.5–5.1)
Potassium: 5.2 mmol/L — ABNORMAL HIGH (ref 3.5–5.1)
Sodium: 165 mmol/L (ref 135–145)
Sodium: 164 mmol/L (ref 135–145)

## 2017-11-17 LAB — URINALYSIS, ROUTINE W REFLEX MICROSCOPIC
Bilirubin Urine: NEGATIVE
Glucose, UA: NEGATIVE mg/dL
HGB URINE DIPSTICK: NEGATIVE
Ketones, ur: NEGATIVE mg/dL
LEUKOCYTES UA: NEGATIVE
Nitrite: NEGATIVE
Protein, ur: NEGATIVE mg/dL
SPECIFIC GRAVITY, URINE: 1.03 (ref 1.005–1.030)
pH: 5 (ref 5.0–8.0)

## 2017-11-17 LAB — LIPASE, BLOOD: LIPASE: 25 U/L (ref 11–51)

## 2017-11-17 LAB — CBC WITH DIFFERENTIAL/PLATELET
ABS IMMATURE GRANULOCYTES: 0 10*3/uL (ref 0.0–0.1)
BASOS PCT: 1 %
Basophils Absolute: 0 10*3/uL (ref 0.0–0.1)
Eosinophils Absolute: 0.2 10*3/uL (ref 0.0–0.7)
Eosinophils Relative: 2 %
HEMATOCRIT: 39.4 % (ref 36.0–46.0)
Hemoglobin: 11.7 g/dL — ABNORMAL LOW (ref 12.0–15.0)
IMMATURE GRANULOCYTES: 0 %
LYMPHS ABS: 2 10*3/uL (ref 0.7–4.0)
Lymphocytes Relative: 26 %
MCH: 30.2 pg (ref 26.0–34.0)
MCHC: 29.7 g/dL — ABNORMAL LOW (ref 30.0–36.0)
MCV: 101.8 fL — ABNORMAL HIGH (ref 78.0–100.0)
MONOS PCT: 5 %
Monocytes Absolute: 0.4 10*3/uL (ref 0.1–1.0)
NEUTROS ABS: 5.1 10*3/uL (ref 1.7–7.7)
Neutrophils Relative %: 66 %
PLATELETS: 238 10*3/uL (ref 150–400)
RBC: 3.87 MIL/uL (ref 3.87–5.11)
RDW: 14.4 % (ref 11.5–15.5)
WBC: 7.7 10*3/uL (ref 4.0–10.5)

## 2017-11-17 LAB — I-STAT CHEM 8, ED
BUN: 28 mg/dL — AB (ref 8–23)
CHLORIDE: 130 mmol/L — AB (ref 98–111)
Calcium, Ion: 1.1 mmol/L — ABNORMAL LOW (ref 1.15–1.40)
Creatinine, Ser: 0.9 mg/dL (ref 0.44–1.00)
Glucose, Bld: 197 mg/dL — ABNORMAL HIGH (ref 70–99)
HEMATOCRIT: 34 % — AB (ref 36.0–46.0)
Hemoglobin: 11.6 g/dL — ABNORMAL LOW (ref 12.0–15.0)
Potassium: 3.6 mmol/L (ref 3.5–5.1)
SODIUM: 166 mmol/L — AB (ref 135–145)
TCO2: 25 mmol/L (ref 22–32)

## 2017-11-17 LAB — CBG MONITORING, ED: Glucose-Capillary: 183 mg/dL — ABNORMAL HIGH (ref 70–99)

## 2017-11-17 LAB — GLUCOSE, CAPILLARY: GLUCOSE-CAPILLARY: 64 mg/dL — AB (ref 70–99)

## 2017-11-17 LAB — OSMOLALITY: OSMOLALITY: 341 mosm/kg — AB (ref 275–295)

## 2017-11-17 LAB — LACTIC ACID, PLASMA
LACTIC ACID, VENOUS: 1.7 mmol/L (ref 0.5–1.9)
Lactic Acid, Venous: 1.8 mmol/L (ref 0.5–1.9)

## 2017-11-17 LAB — TROPONIN I

## 2017-11-17 LAB — MAGNESIUM: Magnesium: 2.4 mg/dL (ref 1.7–2.4)

## 2017-11-17 LAB — LIPID PANEL
CHOLESTEROL: 129 mg/dL (ref 0–200)
HDL: 29 mg/dL — ABNORMAL LOW (ref >40)
LDL Cholesterol: 71 mg/dL (ref 0–99)
Total CHOL/HDL Ratio: 4.4 RATIO
Triglycerides: 145 mg/dL (ref <150)
VLDL: 29 mg/dL (ref 0–40)

## 2017-11-17 LAB — HEMOGLOBIN A1C
Hgb A1c MFr Bld: 7.9 % — ABNORMAL HIGH (ref 4.8–5.6)
Mean Plasma Glucose: 180.03 mg/dL

## 2017-11-17 LAB — I-STAT TROPONIN, ED: TROPONIN I, POC: 0.02 ng/mL (ref 0.00–0.08)

## 2017-11-17 LAB — PROTIME-INR
INR: 1.06
Prothrombin Time: 13.7 seconds (ref 11.4–15.2)

## 2017-11-17 LAB — PHOSPHORUS: Phosphorus: 2.9 mg/dL (ref 2.5–4.6)

## 2017-11-17 MED ORDER — DEXTROSE 50 % IV SOLN
INTRAVENOUS | Status: AC
  Filled 2017-11-17: qty 50

## 2017-11-17 MED ORDER — INSULIN ASPART 100 UNIT/ML ~~LOC~~ SOLN
0.0000 [IU] | Freq: Three times a day (TID) | SUBCUTANEOUS | Status: DC
  Administered 2017-11-18 (×2): 2 [IU] via SUBCUTANEOUS
  Administered 2017-11-18: 7 [IU] via SUBCUTANEOUS
  Administered 2017-11-19: 2 [IU] via SUBCUTANEOUS
  Administered 2017-11-19: 3 [IU] via SUBCUTANEOUS
  Administered 2017-11-19: 5 [IU] via SUBCUTANEOUS
  Administered 2017-11-20 (×2): 2 [IU] via SUBCUTANEOUS
  Administered 2017-11-20: 1 [IU] via SUBCUTANEOUS
  Administered 2017-11-21: 2 [IU] via SUBCUTANEOUS
  Administered 2017-11-21: 5 [IU] via SUBCUTANEOUS

## 2017-11-17 MED ORDER — ENOXAPARIN SODIUM 40 MG/0.4ML ~~LOC~~ SOLN
40.0000 mg | SUBCUTANEOUS | Status: DC
  Administered 2017-11-17 – 2017-11-20 (×4): 40 mg via SUBCUTANEOUS
  Filled 2017-11-17 (×4): qty 0.4

## 2017-11-17 MED ORDER — ACETAMINOPHEN 325 MG PO TABS
650.0000 mg | ORAL_TABLET | Freq: Four times a day (QID) | ORAL | Status: DC | PRN
  Administered 2017-11-18: 650 mg via ORAL

## 2017-11-17 MED ORDER — DEXTROSE 50 % IV SOLN: 25.0000 mL | Freq: Once | INTRAVENOUS | Status: DC

## 2017-11-17 MED ORDER — LACTATED RINGERS IV BOLUS
500.0000 mL | Freq: Once | INTRAVENOUS | Status: AC
  Administered 2017-11-17: 500 mL via INTRAVENOUS

## 2017-11-17 MED ORDER — WHITE PETROLATUM EX OINT
TOPICAL_OINTMENT | CUTANEOUS | Status: AC
  Filled 2017-11-17: qty 28.35

## 2017-11-17 MED ORDER — INSULIN ASPART 100 UNIT/ML ~~LOC~~ SOLN
0.0000 [IU] | Freq: Every day | SUBCUTANEOUS | Status: DC
  Administered 2017-11-18: 3 [IU] via SUBCUTANEOUS

## 2017-11-17 MED ORDER — DEXTROSE 5 % IV SOLN
INTRAVENOUS | Status: DC
  Administered 2017-11-17 – 2017-11-21 (×5): via INTRAVENOUS

## 2017-11-17 NOTE — H&P (Signed)
History and Physical  Jill Francis XMI:[id number] DOB: [birth year] DOA: 11/17/2017  Referring physician: Dr Francia Greaves  PCP: Hennie Duos, MD  Outpatient Specialists: None Patient coming from: SNF   Chief Complaint: Less responsive with decreasing p.o. intake  HPI: Jill Francis is a 84 y.o. female with medical history significant for advanced dementia, uncontrolled type II diabetes, severe peripheral vascular disease status status post left above the knee amputation, oral dysphagia on pured diet presented to ED [facility] from SNF after being noted to be less responsive with decrease in p.o. intake.  Poor oral intake has started about a week ago and have been gradually decreasing.  No reported GI losses.  Unable to obtain history from the patient who was minimally verbal.  History is obtained from the patient's niece who is also her medical power of attorney.  She reports recent treatment for urinary tract infection in which she completed her course of antibiotics.  Recently treated by her PCP for hyperglycemia.  Also noted significant unintentional weight loss at least 10 pounds in the last 6 months.  Patient does not appear to be in distress however her oral mucosa is very dry.  ED Course: Upon presentation to the ED, lab studies remarkable for hypernatremia with sodium of 166 and chloride of 130.  Patient frail-appearing with dry oral mucosa.  ED asked to admit for treatment of hypernatremia and failure to thrive.  Review of Systems: Review of systems as noted in the HPI. All other systems reviewed and are negative.   Past Medical History:  Diagnosis Date  . Allergic rhinitis, unspecified   . Alzheimer's disease 08/19/2012  . Anemia in other chronic diseases classified elsewhere   . Asthma   . Breast cancer ([place])   . Cancer [facility])    Skin cancer  . Depression   . Diabetes mellitus   . Hyperlipidemia   . Hypocalcemia   . Ovarian cancer ([place])   . Personal history of malignant neoplasm  of ovary   . Type II or unspecified type diabetes mellitus without mention of complication, uncontrolled 08/19/2012   Past Surgical History:  Procedure Laterality Date  . AMPUTATION Left 12/18/2016   Procedure: AMPUTATION ABOVE KNEE LEFT;  Surgeon: Angelia Mould, MD;  Location: [facility];  Service: Vascular;  Laterality: Left;  . BACK SURGERY    . BREAST SURGERY  right partial mastectomy  06/2003  . KNEE SURGERY    . SKIN CANCER EXCISION      Social History:  reports that she has quit smoking. She has never used smokeless tobacco. She reports that she does not drink alcohol or use drugs.   Allergies  Allergen Reactions  . Gabapentin     UNSPECIFIED REACTION   . Iodine     UNSPECIFIED REACTION   . Sulfonamide Derivatives     UNSPECIFIED REACTION     Family History  Problem Relation Age of Onset  . Pancreatic cancer Neg Hx     Diabetes, breast and bone cancer in her sister.  Prior to Admission medications   Medication Sig Start Date End Date Taking? Authorizing Provider  acetaminophen (TYLENOL) 500 MG tablet Take 1,000 mg by mouth 3 (three) times daily. (0900, 1400, & 1900)   Yes [provider]  donepezil (ARICEPT) 10 MG tablet Take 10 mg by mouth at bedtime.  10/28/10  Yes [provider]  Insulin Glargine (TOUJEO SOLOSTAR) 300 UNIT/ML SOPN Inject 12 Units into the skin at bedtime.  Yes [provider]  insulin lispro (HUMALOG) 100 UNIT/ML injection Inject 0-10 Units into the skin 4 (four) times daily -  before meals and at bedtime. Per sliding scale: 0-150= 0units, 151-200=2 units, 201-250= 4units, 251-300=6units, 301-350=8units, 351-400=10units.   Yes [provider]  memantine (NAMENDA) 10 MG tablet Take 10 mg by mouth 2 (two) times daily.    Yes [provider]  Menthol, Topical Analgesic, (BIOFREEZE) 4 % GEL Apply 1 application topically 2 (two) times daily. Apply to right lower leg and foot for chronic pain   Yes [provider]  Multiple Vitamin (MULTIVITAMIN WITH MINERALS) TABS tablet Take 1 tablet by mouth daily.   Yes [provider]  sitaGLIPtin (JANUVIA) 100 MG tablet Take 100 mg by mouth at bedtime.    Yes [provider]  Vitamin D, Ergocalciferol, (DRISDOL) 50000 units CAPS capsule Take 50,000 Units by mouth every 30 (thirty) days.   Yes [provider]    Physical Exam: BP (!) 119/50   Pulse 75   Resp 19   SpO2 100%   . General: 84 y.o. year-old female frail, dry oral mucosa, alert, minimally verbal, confused in the setting of advanced dementia. . Cardiovascular: Irregular rate and rhythm with no rubs or gallops.  No thyromegaly or JVD noted.  No lower extremity edema. 2/4 pulses in all 4 extremities. Marland Kitchen Respiratory: Clear to auscultation with no wheezes or rales. Good inspiratory effort. . Abdomen: Soft nontender nondistended with normal bowel sounds x4 quadrants. . Muskuloskeletal: No cyanosis, clubbing or edema noted bilaterally.  Left above-the-knee amputation. . Neuro: CN II-XII intact, strength, sensation, reflexes.  No noted focal motor deficits. . Skin: No ulcerative lesions noted.  Old scab noted in right ankle. Marland Kitchen Psychiatry: Judgement and insight appear normal. Mood is appropriate for condition and setting          Labs on Admission:  Basic Metabolic Panel: Recent Labs  Lab 11/17/17 1405 11/17/17 1418  NA 164* 166*  K 3.7 3.6  CL >130* 130*  CO2 26  --   GLUCOSE 194* 197*  BUN 26* 28*  CREATININE 0.94 0.90  CALCIUM 8.3*  --    Liver Function Tests: Recent Labs  Lab 11/17/17 1405  AST 18  ALT 17  ALKPHOS 66  BILITOT 0.3  PROT 6.2*  ALBUMIN 2.6*   Recent Labs  Lab 11/17/17 1405  LIPASE 25   No results for input(s): AMMONIA in the last 168 hours. CBC: Recent Labs  Lab 11/17/17 1405 11/17/17 1418  WBC 7.7  --   NEUTROABS 5.1  --   HGB 11.7* 11.6*  HCT 39.4 34.0*  MCV 101.8*  --   PLT 238  --    Cardiac Enzymes: Recent  Labs  Lab 11/17/17 1405  TROPONINI <0.03    BNP (last 3 results) No results for input(s): BNP in the last 8760 hours.  ProBNP (last 3 results) No results for input(s): PROBNP in the last 8760 hours.  CBG: Recent Labs  Lab 11/17/17 1426  GLUCAP 183*    Radiological Exams on Admission: Dg Chest 1 View  Result Date: 11/17/2017 CLINICAL DATA:  Weakness EXAM: CHEST  1 VIEW COMPARISON:  10/17/2016 FINDINGS: Surgical clips at the base of the right neck. Cardiomegaly. Aortic atherosclerosis. No acute consolidation or effusion. No pneumothorax. Chronic widening of right AC joint with probable resection of the distal clavicle IMPRESSION: No active disease.  Cardiomegaly Electronically Signed   By: Madie Reno.D.  On: 11/17/2017 15:33    EKG: I independently viewed the EKG done and my findings are as followed: A. fib with rate of 72.  No acute or specific ST-T changes.  Assessment/Plan Present on Admission: . Dehydration with hypernatremia  Active Problems:   Dehydration with hypernatremia  Severe hypovolemic hypernatremia suspect chronic  Suspect secondary to poor oral intake No reported GI losses Sodium on presentation 166, chloride 130 Start D5W at 50 cc/h Goal is to correct sodium no more than 10 points in 24 hrs. Monitor BMP every 4 hours Monitor urine output-has a Foley catheter that was placed in the ED Encourage oral fluid intake Urine sodium and potassium pending Urine osmolality and serum osmolality pending  Failure to thrive/moderate to severe protein calorie malnutrition Frail-appearing Low BMI Albumin 2.6 Encourage increase protein calorie intake Start oral supplement  Dysphagia Aspiration precaution Start pured diet with thin liquids Speech therapist to evaluate in the morning  Chronic A. fib Rate is currently controlled Not on any AV node blockade medication Not on anticoagulation due to high fall risk and bleeding Last 2D echo done on 12/19/2016  revealed LVEF 55 to 60% with normal wall motion Continue to monitor on telemetry  Uncontrolled type 2 diabetes Obtain A1c Last A1c 06/16/16 was 7.1 Suspect will get worse with D5W infusion Insulin sliding scale started  Severe peripheral vascular disease status post left above-the-knee amputation Ambulatory dysfunction Uses a wheelchair at baseline Fall precautions Not on antiplatelet, unclear reason Obtain lipid panel  Risks: High risk for decompensation due to severe hypernatremia, severe dehydration, failure to thrive, uncontrolled diabetes, chronic A. fib, and advanced age.  DVT prophylaxis: Lovenox subcu daily  Code Status: DNR  Family Communication: Niece at bedside who is also medical power of attorney.  All questions answered to her satisfaction.  Disposition Plan: Admit to telemetry unit  Consults called: None  Admission status: Inpatient status    Kayleen Memos MD [facility] Pager [phone]  If 7PM-7AM, please contact night-coverage [email or web address] Password [email or web address]  11/17/2017, 5:04 PM

## 2017-11-17 NOTE — ED Triage Notes (Signed)
Pt BIB[facility] for failure to thrive x 2 weeks and abnormal lab work from 8/30  ( na+ 165, bun/crt ratio 37.8 and glucose 545)  Upon ems arrival pt was having shallow breathing and pinpoint pupils and was given 1 mg of narcan ; ems states after narcan was given, pt became more alert  ; pt is usually alert and recognizes people and is able to follow very simple commands

## 2017-11-17 NOTE — ED Provider Notes (Signed)
[place] EMERGENCY DEPARTMENT Provider Note   CSN: [id number] Arrival date & time: 11/17/17  1359     History   Chief Complaint Chief Complaint  Patient presents with  . Abnormal Lab  . Failure To Thrive    HPI Jill Francis is a 84 y.o. female.  84 year old female with prior medical history as detailed below presents for evaluation of hypernatremia and failure to thrive.  Patient resides at [facility] and rehab.  Patient with noted decreased p.o. intake over the last week.  Patient found to have an elevated sodium today.  She was sent to the ED for evaluation.    The patient is unable to provide significant history secondary to underlying dementia.  Level 5 Caveat.   The history is provided by the patient and medical records.  Abnormal Lab  Time since result:  Elevated sodium, FTT Patient referred by:  Nursing home   Past Medical History:  Diagnosis Date  . Allergic rhinitis, unspecified   . Alzheimer's disease 08/19/2012  . Anemia in other chronic diseases classified elsewhere   . Asthma   . Breast cancer ([place])   . Cancer [facility])    Skin cancer  . Depression   . Diabetes mellitus   . Hyperlipidemia   . Hypocalcemia   . Ovarian cancer ([place])   . Personal history of malignant neoplasm of ovary   . Type II or unspecified type diabetes mellitus without mention of complication, uncontrolled 08/19/2012    Patient Active Problem List   Diagnosis Date Noted  . Dehydration with hypernatremia 11/17/2017  . Gangrene of left foot ([place]) 12/18/2016  . Breast cancer ([place])   . Osteoporosis 06/23/2015  . Bradycardia 09/18/2014  . Prolonged QT interval 09/18/2014  . Hypocalcemia 05/27/2014  . FTT (failure to thrive) in adult 05/27/2014  . Dysphagia, oral phase 04/26/2014  . CKD (chronic kidney disease) stage 2, GFR 60-89 ml/min 04/26/2014  . Type 2 diabetes mellitus without complication ([place]) 34/19/3790  . Hyperlipidemia   . Chronic renal disease,  stage 2, mildly decreased glomerular filtration rate between 60-89 mL/min/1.73 square meter 10/14/2013  . Cough 06/29/2013  . Hypoglycemia 01/16/2013  . Psychosis ([place]) 09/20/2012  . Diabetes mellitus type 2, uncontrolled, without complications ([place]) 24/11/7351  . Dementia with behavioral disturbance 08/19/2012  . Iron deficiency anemia 08/19/2012  . Depression 08/19/2012  . Malignant neoplasm of female breast ([place]) 06/30/2008  . ADENOCARCINOMA, OVARY 06/29/2008  . Anxiety state 01/16/2007    Past Surgical History:  Procedure Laterality Date  . AMPUTATION Left 12/18/2016   Procedure: AMPUTATION ABOVE KNEE LEFT;  Surgeon: Angelia Mould, MD;  Location: [place];  Service: Vascular;  Laterality: Left;  . BACK SURGERY    . BREAST SURGERY  right partial mastectomy  06/2003  . KNEE SURGERY    . SKIN CANCER EXCISION       OB History   None      Home Medications    Prior to Admission medications   Medication Sig Start Date End Date Taking? Authorizing Provider  acetaminophen (TYLENOL) 500 MG tablet Take 1,000 mg by mouth 3 (three) times daily. (0900, 1400, & 1900)    [provider]  calcitonin, salmon, (MIACALCIN/FORTICAL) 200 UNIT/ACT nasal spray Place 1 spray into alternate nostrils daily. (0900)    [provider]  calcium-vitamin D (OSCAL WITH D) 500-200 MG-UNIT per tablet Take 1 tablet by mouth 3 (three) times daily with meals.     [provider]  donepezil (ARICEPT) 10 MG tablet Take 10 mg by mouth at bedtime. (2100) For dementia 10/28/10   [provider]  ferrous sulfate 220 (44 Fe) MG/5ML solution Take 330 mg by mouth daily. (0900) 7.5 ml - 330 mg    [provider]  Insulin Glargine (TOUJEO SOLOSTAR) 300 UNIT/ML SOPN Inject 10 Units into the skin at bedtime. (2100)    [provider]  memantine (NAMENDA) 10 MG tablet Take 10 mg by mouth 2 (two) times daily. (0900 & 2100) For dementia    [provider]    Multiple Vitamins-Minerals (DECUBI-VITE PO) Take 1 tablet by mouth daily. (0900)    [provider]  Nutritional Supplements (NUTRITIONAL SUPPLEMENT PO) Take 120 mLs by mouth 3 (three) times daily. Med Pass 2.0 (0800, 1300, & 1800)    [provider]  oxyCODONE-acetaminophen (PERCOCET/ROXICET) 5-325 MG tablet Take 1-2 tablets by mouth every 6 (six) hours as needed for moderate pain. 12/23/16   Ulyses Amor, PA-C  sitaGLIPtin (JANUVIA) 100 MG tablet Take 100 mg by mouth at bedtime. (2100)    [provider]  traMADol (ULTRAM) 50 MG tablet Take 50 mg by mouth 2 (two) times daily. (0900 & 2100)    [provider]  Vitamin D, Ergocalciferol, (DRISDOL) 50000 units CAPS capsule Take 50,000 Units by mouth every 30 (thirty) days.    [provider]    Family History Family History  Problem Relation Age of Onset  . Pancreatic cancer Neg Hx     Social History Social History   Tobacco Use  . Smoking status: Former Research scientist (life sciences)  . Smokeless tobacco: Never Used  Substance Use Topics  . Alcohol use: No  . Drug use: No     Allergies   Gabapentin; Iodine; and Sulfonamide derivatives   Review of Systems Review of Systems  Unable to perform ROS: Dementia     Physical Exam Updated Vital Signs BP 118/64   Pulse 75   Resp 17   SpO2 99%   Physical Exam  Constitutional: She appears well-developed and well-nourished. No distress.  HENT:  Head: Normocephalic and atraumatic.  Mouth/Throat: Oropharynx is clear and moist.  Eyes: Pupils are equal, round, and reactive to light. Conjunctivae and EOM are normal.  Neck: Normal range of motion. Neck supple.  Cardiovascular: Normal rate, regular rhythm and normal heart sounds.  Pulmonary/Chest: Effort normal and breath sounds normal. No respiratory distress.  Abdominal: Soft. She exhibits no distension. There is no tenderness.  Musculoskeletal: Normal range of motion. She exhibits no edema or deformity.   Neurological: She is alert.  Alert, awake Grunts unintelligibly with verbal stimulation  Moves all 4 extremities.   Skin: Skin is warm and dry.  Psychiatric: She has a normal mood and affect.  Nursing note and vitals reviewed.    ED Treatments / Results  Labs (all labs ordered are listed, but only abnormal results are displayed) Labs Reviewed  URINALYSIS, ROUTINE W REFLEX MICROSCOPIC - Abnormal; Notable for the following components:      Result Value   Color, Urine AMBER (*)    APPearance HAZY (*)    All other components within normal limits  COMPREHENSIVE METABOLIC PANEL - Abnormal; Notable for the following components:   Sodium 164 (*)    Chloride >130 (*)    Glucose, Bld 194 (*)    BUN 26 (*)    Calcium 8.3 (*)    Total Protein 6.2 (*)    Albumin 2.6 (*)  GFR calc non Af Amer 53 (*)    All other components within normal limits  CBC WITH DIFFERENTIAL/PLATELET - Abnormal; Notable for the following components:   Hemoglobin 11.7 (*)    MCV 101.8 (*)    MCHC 29.7 (*)    All other components within normal limits  I-STAT CHEM 8, ED - Abnormal; Notable for the following components:   Sodium 166 (*)    Chloride 130 (*)    BUN 28 (*)    Glucose, Bld 197 (*)    Calcium, Ion 1.10 (*)    Hemoglobin 11.6 (*)    HCT 34.0 (*)    All other components within normal limits  CBG MONITORING, ED - Abnormal; Notable for the following components:   Glucose-Capillary 183 (*)    All other components within normal limits  CULTURE, BLOOD (ROUTINE X 2)  CULTURE, BLOOD (ROUTINE X 2)  PROTIME-INR  LACTIC ACID, PLASMA  LIPASE, BLOOD  TROPONIN I  LACTIC ACID, PLASMA  NA AND K (SODIUM & POTASSIUM), RAND UR  OSMOLALITY, URINE  OSMOLALITY  I-STAT TROPONIN, ED    EKG EKG Interpretation  Date/Time:  Tuesday November 17 2017 14:16:12 EDT Ventricular Rate:  77 PR Interval:    QRS Duration: 93 QT Interval:  438 QTC Calculation: 496 R Axis:   40 Text Interpretation:  Atrial  fibrillation Low voltage, precordial leads Borderline prolonged QT interval Confirmed by Dene Gentry [id number]) on 11/17/2017 2:19:43 PM   Radiology Dg Chest 1 View  Result Date: 11/17/2017 CLINICAL DATA:  Weakness EXAM: CHEST  1 VIEW COMPARISON:  10/17/2016 FINDINGS: Surgical clips at the base of the right neck. Cardiomegaly. Aortic atherosclerosis. No acute consolidation or effusion. No pneumothorax. Chronic widening of right AC joint with probable resection of the distal clavicle IMPRESSION: No active disease.  Cardiomegaly Electronically Signed   By: Donavan Foil M.D.   On: 11/17/2017 15:33    Procedures Procedures (including critical care time)  Medications Ordered in ED Medications  lactated ringers bolus 500 mL (500 mLs Intravenous New Bag/Given 11/17/17 1502)     Initial Impression / Assessment and Plan / ED Course  I have reviewed the triage vital signs and the nursing notes.  Pertinent labs & imaging results that were available during my care of the patient were reviewed by me and considered in my medical decision making (see chart for details).     MDM  Screen complete  Patient is presenting for evaluation of failure to thrive and associated hypernatremia.   Patient requires admission for IVF and further workup.   Dr. Nevada Crane aware of case and will evaluate the patient for admission.     Final Clinical Impressions(s) / ED Diagnoses   Final diagnoses:  Hypernatremia    ED Discharge Orders    None       Valarie Merino, MD 11/17/17 1553

## 2017-11-17 NOTE — ED Notes (Signed)
Pt's niece Jaclyn Prime POA Ph# [phone]

## 2017-11-17 NOTE — ED Notes (Signed)
Dr. Francia Greaves aware of 164 na+chloride >130

## 2017-11-18 DIAGNOSIS — E440 Moderate protein-calorie malnutrition: Secondary | ICD-10-CM

## 2017-11-18 DIAGNOSIS — E870 Hyperosmolality and hypernatremia: Principal | ICD-10-CM

## 2017-11-18 LAB — BASIC METABOLIC PANEL
ANION GAP: 8 (ref 5–15)
Anion gap: 8 (ref 5–15)
Anion gap: 8 (ref 5–15)
Anion gap: 10 (ref 5–15)
BUN: 26 mg/dL — ABNORMAL HIGH (ref 8–23)
BUN: 21 mg/dL (ref 8–23)
BUN: 25 mg/dL — AB (ref 8–23)
BUN: 18 mg/dL (ref 8–23)
BUN: 20 mg/dL (ref 8–23)
BUN: 19 mg/dL (ref 8–23)
CALCIUM: 8.1 mg/dL — AB (ref 8.9–10.3)
CHLORIDE: 129 mmol/L — AB (ref 98–111)
CHLORIDE: 125 mmol/L — AB (ref 98–111)
CHLORIDE: 125 mmol/L — AB (ref 98–111)
CHLORIDE: 126 mmol/L — AB (ref 98–111)
CO2: 22 mmol/L (ref 22–32)
CO2: 23 mmol/L (ref 22–32)
CO2: 22 mmol/L (ref 22–32)
CO2: 24 mmol/L (ref 22–32)
CO2: 20 mmol/L — AB (ref 22–32)
CO2: 21 mmol/L — AB (ref 22–32)
CREATININE: 0.82 mg/dL (ref 0.44–1.00)
CREATININE: 0.81 mg/dL (ref 0.44–1.00)
CREATININE: 0.78 mg/dL (ref 0.44–1.00)
CREATININE: 0.83 mg/dL (ref 0.44–1.00)
CREATININE: 0.78 mg/dL (ref 0.44–1.00)
Calcium: 8 mg/dL — ABNORMAL LOW (ref 8.9–10.3)
Calcium: 8.1 mg/dL — ABNORMAL LOW (ref 8.9–10.3)
Calcium: 7.6 mg/dL — ABNORMAL LOW (ref 8.9–10.3)
Calcium: 7.7 mg/dL — ABNORMAL LOW (ref 8.9–10.3)
Calcium: 7.6 mg/dL — ABNORMAL LOW (ref 8.9–10.3)
Chloride: >130 mmol/L (ref 98–111)
Creatinine, Ser: 0.84 mg/dL (ref 0.44–1.00)
GFR calc Af Amer: >60 mL/min (ref >60)
GFR calc Af Amer: >60 mL/min (ref >60)
GFR calc Af Amer: >60 mL/min (ref >60)
GFR calc Af Amer: >60 mL/min (ref >60)
GFR calc non Af Amer: >60 mL/min (ref >60)
GFR calc non Af Amer: >60 mL/min (ref >60)
GFR calc non Af Amer: >60 mL/min (ref >60)
GFR calc non Af Amer: >60 mL/min (ref >60)
GFR calc non Af Amer: >60 mL/min (ref >60)
Glucose, Bld: 295 mg/dL — ABNORMAL HIGH (ref 70–99)
Glucose, Bld: 210 mg/dL — ABNORMAL HIGH (ref 70–99)
Glucose, Bld: 239 mg/dL — ABNORMAL HIGH (ref 70–99)
Glucose, Bld: 298 mg/dL — ABNORMAL HIGH (ref 70–99)
Glucose, Bld: 309 mg/dL — ABNORMAL HIGH (ref 70–99)
Glucose, Bld: 312 mg/dL — ABNORMAL HIGH (ref 70–99)
POTASSIUM: 3.8 mmol/L (ref 3.5–5.1)
POTASSIUM: 5.2 mmol/L — AB (ref 3.5–5.1)
POTASSIUM: 3.3 mmol/L — AB (ref 3.5–5.1)
POTASSIUM: 4.6 mmol/L (ref 3.5–5.1)
POTASSIUM: 4.4 mmol/L (ref 3.5–5.1)
Potassium: 4.8 mmol/L (ref 3.5–5.1)
SODIUM: 163 mmol/L — AB (ref 135–145)
Sodium: 160 mmol/L — ABNORMAL HIGH (ref 135–145)
Sodium: 161 mmol/L (ref 135–145)
Sodium: 157 mmol/L — ABNORMAL HIGH (ref 135–145)
Sodium: 155 mmol/L — ABNORMAL HIGH (ref 135–145)
Sodium: 155 mmol/L — ABNORMAL HIGH (ref 135–145)

## 2017-11-18 LAB — BLOOD CULTURE ID PANEL (REFLEXED)
ACINETOBACTER BAUMANNII: NOT DETECTED
CANDIDA PARAPSILOSIS: NOT DETECTED
Candida albicans: NOT DETECTED
Candida glabrata: NOT DETECTED
Candida krusei: NOT DETECTED
Candida tropicalis: NOT DETECTED
ENTEROCOCCUS SPECIES: NOT DETECTED
Enterobacter cloacae complex: NOT DETECTED
Enterobacteriaceae species: NOT DETECTED
Escherichia coli: NOT DETECTED
HAEMOPHILUS INFLUENZAE: NOT DETECTED
KLEBSIELLA OXYTOCA: NOT DETECTED
Klebsiella pneumoniae: NOT DETECTED
LISTERIA MONOCYTOGENES: NOT DETECTED
Methicillin resistance: NOT DETECTED
NEISSERIA MENINGITIDIS: NOT DETECTED
PSEUDOMONAS AERUGINOSA: NOT DETECTED
Proteus species: NOT DETECTED
STAPHYLOCOCCUS AUREUS BCID: NOT DETECTED
STREPTOCOCCUS AGALACTIAE: NOT DETECTED
STREPTOCOCCUS PNEUMONIAE: NOT DETECTED
STREPTOCOCCUS SPECIES: NOT DETECTED
Serratia marcescens: NOT DETECTED
Staphylococcus species: DETECTED — AB
Streptococcus pyogenes: NOT DETECTED

## 2017-11-18 LAB — CBC
HCT: 37.1 % (ref 36.0–46.0)
HEMOGLOBIN: 10.8 g/dL — AB (ref 12.0–15.0)
MCH: 30 pg (ref 26.0–34.0)
MCHC: 29.1 g/dL — ABNORMAL LOW (ref 30.0–36.0)
MCV: 103.1 fL — ABNORMAL HIGH (ref 78.0–100.0)
Platelets: 140 10*3/uL — ABNORMAL LOW (ref 150–400)
RBC: 3.6 MIL/uL — AB (ref 3.87–5.11)
RDW: 14.2 % (ref 11.5–15.5)
WBC: 7.3 10*3/uL (ref 4.0–10.5)

## 2017-11-18 LAB — GLUCOSE, CAPILLARY
GLUCOSE-CAPILLARY: 175 mg/dL — AB (ref 70–99)
GLUCOSE-CAPILLARY: 190 mg/dL — AB (ref 70–99)
GLUCOSE-CAPILLARY: 170 mg/dL — AB (ref 70–99)
Glucose-Capillary: 211 mg/dL — ABNORMAL HIGH (ref 70–99)
Glucose-Capillary: 307 mg/dL — ABNORMAL HIGH (ref 70–99)
Glucose-Capillary: 279 mg/dL — ABNORMAL HIGH (ref 70–99)

## 2017-11-18 LAB — MRSA PCR SCREENING: MRSA by PCR: NEGATIVE

## 2017-11-18 MED ORDER — POTASSIUM CHLORIDE CRYS ER 20 MEQ PO TBCR
20.0000 meq | EXTENDED_RELEASE_TABLET | Freq: Once | ORAL | Status: AC
  Administered 2017-11-18: 20 meq via ORAL
  Filled 2017-11-18: qty 1

## 2017-11-18 MED ORDER — ZOLPIDEM TARTRATE 5 MG PO TABS: 5.0000 mg | ORAL_TABLET | Freq: Once | ORAL | Status: DC

## 2017-11-18 MED ORDER — DONEPEZIL HCL 10 MG PO TABS
10.0000 mg | ORAL_TABLET | Freq: Every day | ORAL | Status: DC
  Administered 2017-11-18 – 2017-11-20 (×2): 10 mg via ORAL
  Filled 2017-11-18 (×2): qty 1

## 2017-11-18 MED ORDER — MEMANTINE HCL 10 MG PO TABS
10.0000 mg | ORAL_TABLET | Freq: Two times a day (BID) | ORAL | Status: DC
  Administered 2017-11-18 – 2017-11-21 (×5): 10 mg via ORAL
  Filled 2017-11-18 (×7): qty 1

## 2017-11-18 NOTE — Progress Notes (Signed)
PHARMACY - PHYSICIAN COMMUNICATION CRITICAL VALUE ALERT - BLOOD CULTURE IDENTIFICATION (BCID)  Jill Francis is an 84 y.o. female who presented to [facility] on 11/17/2017 with a chief complaint of less responsive and decrease appetite.   Assessment:  Pt presented with dehydration and hyponatremia. Blood cultures were sent. Labs called with BCID result today. One out of 4 bottles was positive for CNS. This is likely a contaminant. Will send a text message to Dr. Darrick Meigs  Name of physician (or Provider) Contacted: Darrick Meigs  Current antibiotics: None  Changes to prescribed antibiotics recommended:  None  Onnie Boer, PharmD, [place], AAHIVP, CPP Infectious Disease Pharmacist Pager: [phone] 11/18/2017 10:33 AM

## 2017-11-18 NOTE — Progress Notes (Signed)
[facility]  PROGRESS NOTE  Jill Francis IWP:[id number] DOB: 09-10-1927 DOA: 11/17/2017 PCP: Hennie Duos, MD   Brief HPI:   84 year old female with a history of advanced dementia, uncontrolled type 2 diabetes mellitus, peripheral vascular disease, status post left above-knee amputation, oral dysphagia on pured diet was sent to the ED from skilled facility as patient was found to be less responsive with decreased p.o. intake.  In the ED patient was found to be severely hyponatremic with sodium 166, chloride 130.    Subjective   Patient is nonverbal, unable to provide any history   Assessment/Plan:     1. Hypernatremia-patient came with sodium of 166, started on D5 W, sodium is slowly improved today labs show sodium 157.  Continue with D5W at 50 mL/h.  Follow BMP in a.m.  2. Dysphagia-continue aspiration precaution, pured diet with thin liquid.  Speech therapy has been consulted for swallow evaluation.  3. Chronic atrial fibrillation-heart rate is controlled, patient is not on any AV node blocking medication.  No anti-Coblation due to high risk of fall and bleeding.  Last echo from October 2018 showed EF 55 to 60% with normal wall motion.  Continue monitoring on telemetry.  4. Uncontrolled diabetes mellitus type 2-last hemoglobin A1c was 7.1 in April 2018.  Blood glucoses worsened since patient started on D5W.  Continue sliding scale insulin with NovoLog.  Will closely monitor patient's blood glucose, if persistently elevated might add Lantus 5 units subcu daily.  5. Severe peripheral vascular disease-she is status post left above-the-knee amputation, use wheelchair at baseline.  Not on antiplatelet therapy.  6. Hypokalemia-potassium is 3.3, will replace potassium and check BMP in a.m.  7. Dementia-we will restart home medication, Aricept 10 mg p.o. daily     CBG: Recent Labs  Lab 11/17/17 2150 11/18/17 0447 11/18/17 0804 11/18/17 1140 11/18/17 1648  GLUCAP  211* 175* 190* 170* 307*    CBC: Recent Labs  Lab 11/17/17 1405 11/17/17 1418 11/18/17 0626  WBC 7.7  --  7.3  NEUTROABS 5.1  --   --   HGB 11.7* 11.6* 10.8*  HCT 39.4 34.0* 37.1  MCV 101.8*  --  103.1*  PLT 238  --  140*    Basic Metabolic Panel: Recent Labs  Lab 11/17/17 1803 11/17/17 2238 11/18/17 0239 11/18/17 0626 11/18/17 0956 11/18/17 1438  NA 165* 164* 163* 160* 161* 157*  K 3.8 5.2* 4.8 3.8 5.2* 3.3*  CL >130* >130* >130* 129* >130* 125*  CO2 26 23 22 23 22 24   GLUCOSE 144* 315* 295* 210* 239* 298*  BUN 25* 25* 26* 21 25* 18  CREATININE 0.81 0.81 0.84 0.82 0.81 0.78  CALCIUM 7.9* 8.1* 8.1* 8.0* 8.1* 7.6*  MG 2.4  --   --   --   --   --   PHOS 2.9  --   --   --   --   --      DVT prophylaxis: Lovenox  Code Status: Full code  Family Communication: No family at bedside  Disposition Plan: likely home when medically ready for discharge   Consultants:    Procedures:     Antibiotics:   Anti-infectives (From admission, onward)   None       Objective   Vitals:   11/18/17 0810 11/18/17 0838 11/18/17 1227 11/18/17 1654  BP: (!) 77/63 (!) 122/52 (!) 122/52 (!) 88/65  Pulse: 69 60 60 63  Resp: 18  18 18   Temp: 98.5 F (36.9  C)  98.5 F (36.9 C) 98.5 F (36.9 C)  TempSrc: Oral  Oral Oral  SpO2: 98%   (!) 86%  Weight:   53.5 kg   Height:   5\' 4"  (1.626 m)     Intake/Output Summary (Last 24 hours) at 11/18/2017 1717 Last data filed at 11/18/2017 1704 Gross per 24 hour  Intake 1390.21 ml  Output 125 ml  Net 1265.21 ml   Filed Weights   11/17/17 1700 11/18/17 1227  Weight: 53.5 kg 53.5 kg     Physical Examination:    General: Appears in no acute distress  Cardiovascular: S1-S2, regular  Respiratory: Clear to auscultation bilaterally  Abdomen: Soft, nontender, no organomegaly  Extremities: Left above-knee amputation  Neurologic: Alert, not oriented x3, no focal deficit noted.     Data Reviewed: I have personally  reviewed following labs and imaging studies   Recent Results (from the past 240 hour(s))  Culture, blood (routine x 2)     Status: None (Preliminary result)   Collection Time: 11/17/17  2:40 PM  Result Value Ref Range Status   Specimen Description BLOOD BLOOD RIGHT HAND  Final   Special Requests   Final    BOTTLES DRAWN AEROBIC AND ANAEROBIC Blood Culture adequate volume   Culture   Final    NO GROWTH < 24 HOURS Performed at [facility], [address], [place], [place] [address]    Report Status PENDING  Incomplete  Culture, blood (routine x 2)     Status: None (Preliminary result)   Collection Time: 11/17/17  2:43 PM  Result Value Ref Range Status   Specimen Description BLOOD RIGHT ANTECUBITAL  Final   Special Requests   Final    BOTTLES DRAWN AEROBIC AND ANAEROBIC Blood Culture adequate volume   Culture  Setup Time   Final    GRAM POSITIVE COCCI IN BOTH AEROBIC AND ANAEROBIC BOTTLES CRITICAL RESULT CALLED TO, READ BACK BY AND VERIFIED WITH: [place] [id number] [place] Performed at [facility], [place] [address], [place], [place] [address]    Culture GRAM POSITIVE COCCI  Final   Report Status PENDING  Incomplete  Blood Culture ID Panel (Reflexed)     Status: Abnormal   Collection Time: 11/17/17  2:43 PM  Result Value Ref Range Status   Enterococcus species NOT DETECTED NOT DETECTED Final   Listeria monocytogenes NOT DETECTED NOT DETECTED Final   Staphylococcus species DETECTED (A) NOT DETECTED Final    Comment: Methicillin (oxacillin) susceptible coagulase negative staphylococcus. Possible blood culture contaminant (unless isolated from more than one blood culture draw or clinical case suggests pathogenicity). No antibiotic treatment is indicated for blood  culture contaminants. CRITICAL RESULT CALLED TO, READ BACK BY AND VERIFIED WITH: Jene Every PharmD 10:30 11/18/17 ([email or web address])    Staphylococcus aureus NOT DETECTED NOT DETECTED Final   Methicillin resistance NOT  DETECTED NOT DETECTED Final   Streptococcus species NOT DETECTED NOT DETECTED Final   Streptococcus agalactiae NOT DETECTED NOT DETECTED Final   Streptococcus pneumoniae NOT DETECTED NOT DETECTED Final   Streptococcus pyogenes NOT DETECTED NOT DETECTED Final   Acinetobacter baumannii NOT DETECTED NOT DETECTED Final   Enterobacteriaceae species NOT DETECTED NOT DETECTED Final   Enterobacter cloacae complex NOT DETECTED NOT DETECTED Final   Escherichia coli NOT DETECTED NOT DETECTED Final   Klebsiella oxytoca NOT DETECTED NOT DETECTED Final   Klebsiella pneumoniae NOT DETECTED NOT DETECTED Final   Proteus species NOT DETECTED NOT DETECTED Final  Serratia marcescens NOT DETECTED NOT DETECTED Final   Haemophilus influenzae NOT DETECTED NOT DETECTED Final   Neisseria meningitidis NOT DETECTED NOT DETECTED Final   Pseudomonas aeruginosa NOT DETECTED NOT DETECTED Final   Candida albicans NOT DETECTED NOT DETECTED Final   Candida glabrata NOT DETECTED NOT DETECTED Final   Candida krusei NOT DETECTED NOT DETECTED Final   Candida parapsilosis NOT DETECTED NOT DETECTED Final   Candida tropicalis NOT DETECTED NOT DETECTED Final    Comment: Performed at [facility], [place] [address], [place], [place] [address]  MRSA PCR Screening     Status: None   Collection Time: 11/18/17 11:21 AM  Result Value Ref Range Status   MRSA by PCR NEGATIVE NEGATIVE Final    Comment:        The GeneXpert MRSA Assay (FDA approved for NASAL specimens only), is one component of a comprehensive MRSA colonization surveillance program. It is not intended to diagnose MRSA infection nor to guide or monitor treatment for MRSA infections. Performed at [facility], [place] [address] [place], [place] [address]      Liver Function Tests: Recent Labs  Lab 11/17/17 1405  AST 18  ALT 17  ALKPHOS 66  BILITOT 0.3  PROT 6.2*  ALBUMIN 2.6*   Recent Labs  Lab 11/17/17 1405  LIPASE 25   No results for  input(s): AMMONIA in the last 168 hours.  Cardiac Enzymes: Recent Labs  Lab 11/17/17 1405  TROPONINI <0.03   BNP (last 3 results) No results for input(s): BNP in the last 8760 hours.  ProBNP (last 3 results) No results for input(s): PROBNP in the last 8760 hours.    Studies: Dg Chest 1 View  Result Date: 11/17/2017 CLINICAL DATA:  Weakness EXAM: CHEST  1 VIEW COMPARISON:  10/17/2016 FINDINGS: Surgical clips at the base of the right neck. Cardiomegaly. Aortic atherosclerosis. No acute consolidation or effusion. No pneumothorax. Chronic widening of right AC joint with probable resection of the distal clavicle IMPRESSION: No active disease.  Cardiomegaly Electronically Signed   By: Donavan Foil M.D.   On: 11/17/2017 15:33    Scheduled Meds: . dextrose  25 mL Intravenous Once  . donepezil  10 mg Oral QHS  . enoxaparin (LOVENOX) injection  40 mg Subcutaneous Q24H  . insulin aspart  0-5 Units Subcutaneous QHS  . insulin aspart  0-9 Units Subcutaneous TID WC  . memantine  10 mg Oral BID  . zolpidem  5 mg Oral Once      Time spent: 25 min  [facility] Pager [phone]. If 7PM-7AM, please contact night-coverage at [email or web address], Office  [phone]  password [place]  11/18/2017, 5:17 PM  LOS: 1 day

## 2017-11-18 NOTE — Progress Notes (Signed)
Initial Nutrition Assessment  DOCUMENTATION CODES:   Non-severe (moderate) malnutrition in context of chronic illness  INTERVENTION:   Magic cup TID with meals, each supplement provides 290 kcal and 9 grams of protein  Feeding assistance at meal times   NUTRITION DIAGNOSIS:   Moderate Malnutrition related to chronic illness(advanced dementia, FTT) as evidenced by moderate muscle depletion, moderate fat depletion.  GOAL:   Patient will meet greater than or equal to 90% of their needs   MONITOR:   PO intake, Supplement acceptance, Labs, Weight trends  REASON FOR ASSESSMENT:   Malnutrition Screening Tool    ASSESSMENT:   84 yo female admitted with dehydration and poor oral intake with severe hypovolemic hypernatremia, FTT, dysphagia.  PMH includes advanced dementia, uncontrolled DM, severe PVD s/p L AKA. Reported 10 pound wt loss in 6 months.   Dysphagia I, Pudding Thick (No Liquids) Diet; SLP following. Recorded po intake 0%.   Pt unable to provide reliable history. Based on admission report of dehydration, poor po, weight loss; I presume pt has not been eating well prior to admission.  Cousin at bedside and reports po intake has been down but unable to provide much information  Labs: sodium 161 (H), potassium 5.2, Chloride >130 (H), Creatinine wdl, BUN 25 Meds: D5 at 50 ml/hr  NUTRITION - FOCUSED PHYSICAL EXAM:  Severe wasting in LE but pt wheelchair bound status   Most Recent Value  Orbital Region  Moderate depletion  Upper Arm Region  Mild depletion  Thoracic and Lumbar Region  Mild depletion  Buccal Region  Moderate depletion  Temple Region  Moderate depletion  Clavicle Bone Region  Moderate depletion  Clavicle and Acromion Bone Region  Moderate depletion  Scapular Bone Region  Moderate depletion  Dorsal Hand  Moderate depletion  Patellar Region  Severe depletion  Anterior Thigh Region  Severe depletion  Posterior Calf Region  Severe depletion [R Leg]   Edema (RD Assessment)  None       Diet Order:   Diet Order            DIET - DYS 1 Room service appropriate? Yes; Fluid consistency: Pudding Thick  Diet effective now              EDUCATION NEEDS:   Not appropriate for education at this time  Skin:  Skin Assessment: Reviewed RN Assessment  Last BM:  9/4  Height:   Ht Readings from Last 1 Encounters:  11/18/17 5\' 4"  (1.626 m)    Weight:   Wt Readings from Last 1 Encounters:  11/18/17 53.5 kg    Ideal Body Weight:     BMI:  Body mass index is 20.25 kg/m.  Estimated Nutritional Needs:   Kcal:  1400-1600 kcals   Protein:  70-80 g  Fluid:  >/= 1.5 L    Kerman Passey MS, RD, LDN, CNSC [phone] Pager  [phone] Weekend/On-Call Pager

## 2017-11-18 NOTE — Evaluation (Signed)
Clinical/Bedside Swallow Evaluation Patient Details  Name: Jill Francis MRN: [id number] Date of Birth: [birth year]  Today's Date: 11/18/2017 Time: SLP Start Time (ACUTE ONLY): 0835 SLP Stop Time (ACUTE ONLY): 0900 SLP Time Calculation (min) (ACUTE ONLY): 25 min  Past Medical History:  Past Medical History:  Diagnosis Date  . Allergic rhinitis, unspecified   . Alzheimer's disease 08/19/2012  . Anemia in other chronic diseases classified elsewhere   . Asthma   . Breast cancer ([place])   . Cancer [facility])    Skin cancer  . Dehydration with hypernatremia 11/17/2017  . Depression   . Diabetes mellitus   . Hyperlipidemia   . Hypocalcemia   . Ovarian cancer ([place])   . Personal history of malignant neoplasm of ovary   . Type II or unspecified type diabetes mellitus without mention of complication, uncontrolled 08/19/2012   Past Surgical History:  Past Surgical History:  Procedure Laterality Date  . AMPUTATION Left 12/18/2016   Procedure: AMPUTATION ABOVE KNEE LEFT;  Surgeon: Angelia Mould, MD;  Location: [place];  Service: Vascular;  Laterality: Left;  . BACK SURGERY    . BREAST SURGERY  right partial mastectomy  06/2003  . KNEE SURGERY    . SKIN CANCER EXCISION     HPI:  Jill Francis is a 84 y.o. female with medical history significant for advanced dementia, uncontrolled type II diabetes, severe peripheral vascular disease status status post left above the knee amputation, oral dysphagia on pured diet presented to ED [facility] from SNF after being noted to be less responsive with decrease in p.o. intake (10 lb weight loss in 6 months). CXR No active disease. Cardiomegaly.   Assessment / Plan / Recommendation Clinical Impression  Pt presented with impaired cognition and ability to follow commands for oral motor examination secondary to dx DM, but further exacerbated d/t current hypernatremia. Pt's niece reported pt can self-feed liquids via straw at baseline, which was not demonstrated  during BSE. She exhibited mild immediate throat clear following presentation of thin. After 4-5 presentations of puree followed by liquid wash, mild throat clear. Pt demonstrated more persistent and immediate mod-severe thoat clear following nectar-thick liquids. Given deconditioning and concern for aspiration primarily with liquids, reccomend continue puree diet and withold all liquids until instrumental MBS examination completed tomorrow.  SLP Visit Diagnosis: Dysphagia, oropharyngeal phase (R13.12)    Aspiration Risk  Moderate aspiration risk    Diet Recommendation Dysphagia 1 (Puree);No liquids   Medication Administration: Crushed with puree Supervision: Full supervision/cueing for compensatory strategies;Staff to assist with self feeding Compensations: Slow rate;Minimize environmental distractions;Small sips/bites;Lingual sweep for clearance of pocketing Postural Changes: Seated upright at 90 degrees    Other  Recommendations Oral Care Recommendations: Oral care BID   Follow up Recommendations Skilled Nursing facility      Frequency and Duration min 2x/week  2 weeks       Prognosis Prognosis for Safe Diet Advancement: Fair Barriers to Reach Goals: Cognitive deficits;Severity of deficits      Swallow Study   General HPI: Jill Francis is a 84 y.o. female with medical history significant for advanced dementia, uncontrolled type II diabetes, severe peripheral vascular disease status status post left above the knee amputation, oral dysphagia on pured diet presented to ED [facility] from SNF after being noted to be less responsive with decrease in p.o. intake (10 lb weight loss in 6 months). CXR No active disease. Cardiomegaly. Type of Study: Bedside Swallow Evaluation Previous Swallow Assessment: (none) Diet Prior  to this Study: Dysphagia 1 (puree);Thin liquids Temperature Spikes Noted: No Respiratory Status: Room air History of Recent Intubation: No Behavior/Cognition:  Cooperative;Alert;Lethargic/Drowsy;Requires cueing;Doesn't follow directions;Confused Oral Cavity Assessment: Dry Oral Care Completed by SLP: No Oral Cavity - Dentition: Other (Comment)(difficult to view, some dentition observed, further assess) Vision: Functional for self-feeding Self-Feeding Abilities: Total assist Patient Positioning: Upright in bed Baseline Vocal Quality: Low vocal intensity Volitional Cough: Other (Comment)(unable to assess - impaired cognition) Volitional Swallow: (unable to assess - impaired cognition)    Oral/Motor/Sensory Function Overall Oral Motor/Sensory Function: Other (comment)(unable to fully assess d/t cog status) Facial Symmetry: Within Functional Limits Lingual Strength: Reduced(?) Mandible: Within Functional Limits   Ice Chips Ice chips: Not tested   Thin Liquid Thin Liquid: Impaired Presentation: Cup;Straw Oral Phase Impairments: Reduced lingual movement/coordination;Reduced labial seal Oral Phase Functional Implications: Prolonged oral transit;Right anterior spillage Pharyngeal  Phase Impairments: Throat Clearing - Immediate;Throat Clearing - Delayed    Nectar Thick Nectar Thick Liquid: Impaired Presentation: Cup Oral Phase Impairments: Reduced lingual movement/coordination;Impaired mastication Oral phase functional implications: Prolonged oral transit;Other (comment)(dec cohesion, mild labial residue) Pharyngeal Phase Impairments: Throat Clearing - Immediate;Multiple swallows   Honey Thick Honey Thick Liquid: Not tested   Puree Puree: Impaired Presentation: Spoon Oral Phase Impairments: Impaired mastication;Reduced lingual movement/coordination Oral Phase Functional Implications: Oral residue;Prolonged oral transit Pharyngeal Phase Impairments: Multiple swallows;Throat Clearing - Immediate   Solid     Jettie Booze, Student SLP  Solid: Not tested      Jettie Booze 11/18/2017,11:06 AM

## 2017-11-18 NOTE — Progress Notes (Signed)
CRITICAL VALUE ALERT  Critical Value: Na: 161 and Cr: >130  Date & Time Notied: 11/18/17 1115  Provider Notified: [place]  Orders Received/Actions taken: No further orders. Will continue to monitor.

## 2017-11-19 ENCOUNTER — Inpatient Hospital Stay (HOSPITAL_COMMUNITY): Payer: Medicare Other

## 2017-11-19 DIAGNOSIS — E440 Moderate protein-calorie malnutrition: Secondary | ICD-10-CM

## 2017-11-19 DIAGNOSIS — R1310 Dysphagia, unspecified: Secondary | ICD-10-CM

## 2017-11-19 LAB — BASIC METABOLIC PANEL
Anion gap: 7 (ref 5–15)
Anion gap: 8 (ref 5–15)
Anion gap: 6 (ref 5–15)
Anion gap: 7 (ref 5–15)
BUN: 17 mg/dL (ref 8–23)
BUN: 16 mg/dL (ref 8–23)
BUN: 15 mg/dL (ref 8–23)
BUN: 17 mg/dL (ref 8–23)
CALCIUM: 7.5 mg/dL — AB (ref 8.9–10.3)
CALCIUM: 7.5 mg/dL — AB (ref 8.9–10.3)
CHLORIDE: 124 mmol/L — AB (ref 98–111)
CHLORIDE: 121 mmol/L — AB (ref 98–111)
CHLORIDE: 123 mmol/L — AB (ref 98–111)
CHLORIDE: 120 mmol/L — AB (ref 98–111)
CO2: 23 mmol/L (ref 22–32)
CO2: 24 mmol/L (ref 22–32)
CO2: 24 mmol/L (ref 22–32)
CO2: 25 mmol/L (ref 22–32)
CREATININE: 0.76 mg/dL (ref 0.44–1.00)
CREATININE: 0.72 mg/dL (ref 0.44–1.00)
CREATININE: 0.72 mg/dL (ref 0.44–1.00)
Calcium: 7.4 mg/dL — ABNORMAL LOW (ref 8.9–10.3)
Calcium: 7.4 mg/dL — ABNORMAL LOW (ref 8.9–10.3)
Creatinine, Ser: 0.74 mg/dL (ref 0.44–1.00)
GFR calc Af Amer: >60 mL/min (ref >60)
GFR calc Af Amer: >60 mL/min (ref >60)
GFR calc non Af Amer: >60 mL/min (ref >60)
GFR calc non Af Amer: >60 mL/min (ref >60)
GLUCOSE: 303 mg/dL — AB (ref 70–99)
Glucose, Bld: 298 mg/dL — ABNORMAL HIGH (ref 70–99)
Glucose, Bld: 242 mg/dL — ABNORMAL HIGH (ref 70–99)
Glucose, Bld: 243 mg/dL — ABNORMAL HIGH (ref 70–99)
Potassium: 4.1 mmol/L (ref 3.5–5.1)
Potassium: 3.4 mmol/L — ABNORMAL LOW (ref 3.5–5.1)
Potassium: 3.4 mmol/L — ABNORMAL LOW (ref 3.5–5.1)
Potassium: 3.7 mmol/L (ref 3.5–5.1)
SODIUM: 154 mmol/L — AB (ref 135–145)
SODIUM: 153 mmol/L — AB (ref 135–145)
SODIUM: 153 mmol/L — AB (ref 135–145)
Sodium: 152 mmol/L — ABNORMAL HIGH (ref 135–145)

## 2017-11-19 LAB — URINE CULTURE

## 2017-11-19 LAB — GLUCOSE, CAPILLARY
GLUCOSE-CAPILLARY: 226 mg/dL — AB (ref 70–99)
Glucose-Capillary: 279 mg/dL — ABNORMAL HIGH (ref 70–99)
Glucose-Capillary: 166 mg/dL — ABNORMAL HIGH (ref 70–99)

## 2017-11-19 MED ORDER — ORAL CARE MOUTH RINSE
15.0000 mL | Freq: Two times a day (BID) | OROMUCOSAL | Status: DC
  Administered 2017-11-19 – 2017-11-21 (×4): 15 mL via OROMUCOSAL

## 2017-11-19 MED ORDER — POTASSIUM CHLORIDE CRYS ER 20 MEQ PO TBCR: 20.0000 meq | EXTENDED_RELEASE_TABLET | Freq: Every day | ORAL | Status: DC

## 2017-11-19 MED ORDER — POTASSIUM CHLORIDE CRYS ER 20 MEQ PO TBCR
20.0000 meq | EXTENDED_RELEASE_TABLET | Freq: Once | ORAL | Status: DC
  Filled 2017-11-19: qty 1

## 2017-11-19 NOTE — Progress Notes (Signed)
Inpatient Diabetes Program Recommendations  AACE/ADA: New Consensus Statement on Inpatient Glycemic Control (2015)  Target Ranges:  Prepandial:   less than 140 mg/dL      Peak postprandial:   less than 180 mg/dL (1-2 hours)      Critically ill patients:  140 - 180 mg/dL   Lab Results  Component Value Date   GLUCAP 279 (H) 11/19/2017   HGBA1C 7.9 (H) 11/17/2017    Review of Glycemic Control Results for Jill Francis, Jill Francis (MRN [id number]) as of 11/19/2017 11:01  Ref. Range 11/18/2017 16:48 11/18/2017 21:49 11/19/2017 07:34  Glucose-Capillary Latest Ref Range: 70 - 99 mg/dL 307 (H) 279 (H) 279 (H)   Diabetes history: Type 2 DM Outpatient Diabetes medications: Toujeo 12 units QHS, Humalog 0-10 units QID, Januvia 100 mg QHS Current orders for Inpatient glycemic control: Novolog 0-9 units TID, Novolog 0-5 units QHS  Inpatient Diabetes Program Recommendations:    Consider adding back a portion of patient's basal insulin: Levemir 6 units QHS.  Thanks, Bronson Curb, MSN, RNC-OB Diabetes Coordinator [phone] (8a-5p)

## 2017-11-19 NOTE — Consult Note (Signed)
Modified Barium Swallow Progress Note  Patient Details  Name: Jill Francis MRN: [id number] Date of Birth: 1927-10-15  Today's Date: 11/19/2017  Modified Barium Swallow completed.  Full report located under Chart Review in the Imaging Section.  Brief recommendations include the following:  Clinical Impression Pt presents with absent oral response to po presentations of nectar thick liquid or puree. Pt exhibited no awareness of bolus, or attempt to manipulate either consistency. Minimal posterior spillage to the level of the vallecula was observed, however, no swallow reflex was triggered, and pt was unable to follow commands to swallow volitionally.  Pt's oral cavity was cleared with moistened swabs and suction, and still no swallow reflex was initiated.  At this time, recommend Palliative Care consult to facilitate establishment of appropriate goals of care. If continued po intake with known high aspiration risk is what family wishes, recommend conservative diet of puree and pudding thick liquids only if pt is awake and alert and demonstrating swallow reflex (present on bedside swallow assessment). SLP will follow briefly for education regarding strategies to mitigate aspiration risk.    Swallow Evaluation Recommendations NPO until goals of care have been established. If pt/family desire continued po intake with known high aspiration risk, recommend puree diet and pudding thick liquids ONLY when pt is awake and alert and demonstrating swallow reflex. Oral care before and after po intake is encouraged.  Recommended Consults: Other (Comment)(Palliative Care consult)  Oral Care Recommendations: Oral care QID  Eirene Rather B. Quentin Ore [facility], [place] Speech Language Pathologist [phone]  Shonna Chock 11/19/2017,12:45 PM

## 2017-11-19 NOTE — Progress Notes (Signed)
[facility]  PROGRESS NOTE  Jill Francis MVH:[id number] DOB: 1928/02/17 DOA: 11/17/2017 PCP: Hennie Duos, MD   Brief HPI:   84 year old female with a history of advanced dementia, uncontrolled type 2 diabetes mellitus, peripheral vascular disease, status post left above-knee amputation, oral dysphagia on pured diet was sent to the ED from skilled facility as patient was found to be less responsive with decreased p.o. intake.  In the ED patient was found to be severely hyponatremic with sodium 166, chloride 130.    Subjective   Patient seen and examined, MBS today showed no swallowing response.   Assessment/Plan:     1. Hypernatremia-patient came with sodium of 166, started on D5 W, sodium is slowly improved today labs show sodium 152.  Continue with D5W at 50 mL/h.  Follow BMP in a.m.  2. Dysphagia-MBS showed no response to food bolus, will make her n.p.o. and get palliative medicine consult for goals of care.    3. Chronic atrial fibrillation-heart rate is controlled, patient is not on any AV node blocking medication.  No anti-Coagulation due to high risk of fall and bleeding.  Last echo from October 2018 showed EF 55 to 60% with normal wall motion.  Continue monitoring on telemetry.  4. Uncontrolled diabetes mellitus type 2-last hemoglobin A1c was 7.1 in April 2018.  Blood glucoses worsened since patient started on D5W.  Continue sliding scale insulin with NovoLog.  Will closely monitor patient's blood glucose, if persistently elevated might add Lantus 5 units subcu daily.  5. Severe peripheral vascular disease-she is status post left above-the-knee amputation, use wheelchair at baseline.  Not on antiplatelet therapy.  6. Hypokalemia-replete  7. Dementia-we will restart home medication, Aricept 10 mg p.o. Daily  8. Coagulase-negative staph bacteremia-1 out of 2 bottles, likely contamination.     CBG: Recent Labs  Lab 11/18/17 1140 11/18/17 1648  11/18/17 2149 11/19/17 0734 11/19/17 1333  GLUCAP 170* 307* 279* 279* 226*    CBC: Recent Labs  Lab 11/17/17 1405 11/17/17 1418 11/18/17 0626  WBC 7.7  --  7.3  NEUTROABS 5.1  --   --   HGB 11.7* 11.6* 10.8*  HCT 39.4 34.0* 37.1  MCV 101.8*  --  103.1*  PLT 238  --  140*    Basic Metabolic Panel: Recent Labs  Lab 11/17/17 1803  11/18/17 2139 11/19/17 0220 11/19/17 0641 11/19/17 1105 11/19/17 1431  NA 165*   < > 155* 154* 153* 153* 152*  K 3.8   < > 4.4 4.1 3.4* 3.4* 3.7  CL >130*   < > 126* 124* 121* 123* 120*  CO2 26   < > 21* 23 24 24 25   GLUCOSE 144*   < > 312* 298* 303* 242* 243*  BUN 25*   < > 19 17 16 15 17   CREATININE 0.81   < > 0.78 0.76 0.72 0.72 0.74  CALCIUM 7.9*   < > 7.6* 7.4* 7.4* 7.5* 7.5*  MG 2.4  --   --   --   --   --   --   PHOS 2.9  --   --   --   --   --   --    < > = values in this interval not displayed.     DVT prophylaxis: Lovenox  Code Status: Full code  Family Communication: No family at bedside  Disposition Plan: likely home when medically ready for discharge   Consultants:    Procedures:  Antibiotics:   Anti-infectives (From admission, onward)   None       Objective   Vitals:   11/18/17 1753 11/18/17 2149 11/19/17 0453 11/19/17 1037  BP: 113/69 (!) 89/57 (!) 120/40 (!) 116/50  Pulse: 70 65 68 72  Resp:  (!) 22 18 18   Temp:  98.2 F (36.8 C) 98.6 F (37 C) 98.8 F (37.1 C)  TempSrc:  Oral Oral Oral  SpO2:  99% 99% 98%  Weight:      Height:        Intake/Output Summary (Last 24 hours) at 11/19/2017 1602 Last data filed at 11/19/2017 1325 Gross per 24 hour  Intake 653.97 ml  Output 225 ml  Net 428.97 ml   Filed Weights   11/17/17 1700 11/18/17 1227  Weight: 53.5 kg 53.5 kg     Physical Examination:   Mouth: Oral mucosa is moist, no lesions on palate,  Neck: Supple, no deformities, masses, or tenderness Lungs: Normal respiratory effort, bilateral clear to auscultation, no crackles or  wheezes.  Heart: Regular rate and rhythm, S1 and S2 normal, no murmurs, rubs auscultated Abdomen: BS normoactive,soft,nondistended,non-tender to palpation,no organomegaly Extremities: Left above-knee amputation Neuro : Alert and not  oriented to time, place and person, No focal deficits      Data Reviewed: I have personally reviewed following labs and imaging studies   Recent Results (from the past 240 hour(s))  Culture, blood (routine x 2)     Status: None (Preliminary result)   Collection Time: 11/17/17  2:40 PM  Result Value Ref Range Status   Specimen Description BLOOD BLOOD RIGHT HAND  Final   Special Requests   Final    BOTTLES DRAWN AEROBIC AND ANAEROBIC Blood Culture adequate volume   Culture   Final    NO GROWTH 2 DAYS Performed at [facility], [place] [address], [place], [place] [address]    Report Status PENDING  Incomplete  Culture, blood (routine x 2)     Status: Abnormal (Preliminary result)   Collection Time: 11/17/17  2:43 PM  Result Value Ref Range Status   Specimen Description BLOOD RIGHT ANTECUBITAL  Final   Special Requests   Final    BOTTLES DRAWN AEROBIC AND ANAEROBIC Blood Culture adequate volume   Culture  Setup Time   Final    GRAM POSITIVE COCCI IN BOTH AEROBIC AND ANAEROBIC BOTTLES CRITICAL RESULT CALLED TO, READ BACK BY AND VERIFIED WITH: PHARMD M PHAM [id number] MWILSON    Culture (A)  Final    STAPHYLOCOCCUS SPECIES (COAGULASE NEGATIVE) THE SIGNIFICANCE OF ISOLATING THIS ORGANISM FROM A SINGLE SET OF BLOOD CULTURES WHEN MULTIPLE SETS ARE DRAWN IS UNCERTAIN. PLEASE NOTIFY THE MICROBIOLOGY DEPARTMENT WITHIN ONE WEEK IF SPECIATION AND SENSITIVITIES ARE REQUIRED. Performed at [facility], [place] [address] [place], [place] [address]    Report Status PENDING  Incomplete  Blood Culture ID Panel (Reflexed)     Status: Abnormal   Collection Time: 11/17/17  2:43 PM  Result Value Ref Range Status   Enterococcus species NOT DETECTED NOT  DETECTED Final   Listeria monocytogenes NOT DETECTED NOT DETECTED Final   Staphylococcus species DETECTED (A) NOT DETECTED Final    Comment: Methicillin (oxacillin) susceptible coagulase negative staphylococcus. Possible blood culture contaminant (unless isolated from more than one blood culture draw or clinical case suggests pathogenicity). No antibiotic treatment is indicated for blood  culture contaminants. CRITICAL RESULT CALLED TO, READ BACK BY AND VERIFIED WITH: Jene Every PharmD  10:30 11/18/17 ([email or web address])    Staphylococcus aureus NOT DETECTED NOT DETECTED Final   Methicillin resistance NOT DETECTED NOT DETECTED Final   Streptococcus species NOT DETECTED NOT DETECTED Final   Streptococcus agalactiae NOT DETECTED NOT DETECTED Final   Streptococcus pneumoniae NOT DETECTED NOT DETECTED Final   Streptococcus pyogenes NOT DETECTED NOT DETECTED Final   Acinetobacter baumannii NOT DETECTED NOT DETECTED Final   Enterobacteriaceae species NOT DETECTED NOT DETECTED Final   Enterobacter cloacae complex NOT DETECTED NOT DETECTED Final   Escherichia coli NOT DETECTED NOT DETECTED Final   Klebsiella oxytoca NOT DETECTED NOT DETECTED Final   Klebsiella pneumoniae NOT DETECTED NOT DETECTED Final   Proteus species NOT DETECTED NOT DETECTED Final   Serratia marcescens NOT DETECTED NOT DETECTED Final   Haemophilus influenzae NOT DETECTED NOT DETECTED Final   Neisseria meningitidis NOT DETECTED NOT DETECTED Final   Pseudomonas aeruginosa NOT DETECTED NOT DETECTED Final   Candida albicans NOT DETECTED NOT DETECTED Final   Candida glabrata NOT DETECTED NOT DETECTED Final   Candida krusei NOT DETECTED NOT DETECTED Final   Candida parapsilosis NOT DETECTED NOT DETECTED Final   Candida tropicalis NOT DETECTED NOT DETECTED Final    Comment: Performed at [facility], [place] [address], [place], [place] [address]  Culture, Urine     Status: Abnormal   Collection Time: 11/17/17  3:15 PM  Result Value Ref  Range Status   Specimen Description URINE, RANDOM  Final   Special Requests ADDED ON 11/18/17 AT 1639  Final   Culture (A)  Final    <10,000 COLONIES/mL INSIGNIFICANT GROWTH Performed at [facility], [place] [address] [place], [place] [address]    Report Status 11/19/2017 FINAL  Final  MRSA PCR Screening     Status: None   Collection Time: 11/18/17 11:21 AM  Result Value Ref Range Status   MRSA by PCR NEGATIVE NEGATIVE Final    Comment:        The GeneXpert MRSA Assay (FDA approved for NASAL specimens only), is one component of a comprehensive MRSA colonization surveillance program. It is not intended to diagnose MRSA infection nor to guide or monitor treatment for MRSA infections. Performed at [facility], [place] [address], [place], [place] [address]      Liver Function Tests: Recent Labs  Lab 11/17/17 1405  AST 18  ALT 17  ALKPHOS 66  BILITOT 0.3  PROT 6.2*  ALBUMIN 2.6*   Recent Labs  Lab 11/17/17 1405  LIPASE 25   No results for input(s): AMMONIA in the last 168 hours.  Cardiac Enzymes: Recent Labs  Lab 11/17/17 1405  TROPONINI <0.03   BNP (last 3 results) No results for input(s): BNP in the last 8760 hours.  ProBNP (last 3 results) No results for input(s): PROBNP in the last 8760 hours.    Studies: Dg Swallowing Func-speech Pathology  Result Date: 11/19/2017 Objective Swallowing Evaluation: Type of Study: MBS-Modified Barium Swallow Study  Patient Details Name: Jill Francis MRN: [id number] Date of Birth: January 16, 1928 Today's Date: 11/19/2017 Time: SLP Start Time (ACUTE ONLY): 2 -SLP Stop Time (ACUTE ONLY): 1200 SLP Time Calculation (min) (ACUTE ONLY): 30 min Past Medical History: Past Medical History: Diagnosis Date . Allergic rhinitis, unspecified  . Alzheimer's disease 08/19/2012 . Anemia in other chronic diseases classified elsewhere  . Asthma  . Breast cancer ([place])  . Cancer [facility])   Skin cancer . Dehydration with hypernatremia 11/17/2017 .  Depression  . Diabetes mellitus  .  Hyperlipidemia  . Hypocalcemia  . Ovarian cancer ([place])  . Personal history of malignant neoplasm of ovary  . Type II or unspecified type diabetes mellitus without mention of complication, uncontrolled 08/19/2012 Past Surgical History: Past Surgical History: Procedure Laterality Date . AMPUTATION Left 12/18/2016  Procedure: AMPUTATION ABOVE KNEE LEFT;  Surgeon: Angelia Mould, MD;  Location: [place];  Service: Vascular;  Laterality: Left; . BACK SURGERY   . BREAST SURGERY  right partial mastectomy  06/2003 . KNEE SURGERY   . SKIN CANCER EXCISION   HPI: Jill Francis is a 84 y.o. female with medical history significant for advanced dementia, uncontrolled type II diabetes, severe peripheral vascular disease status status post left above the knee amputation, oral dysphagia on pured diet presented to ED [facility] from SNF after being noted to be less responsive with decrease in p.o. intake (10 lb weight loss in 6 months). CXR No active disease. Cardiomegaly.  Subjective: Pt seen in radiology for MBS. Niece present Assessment / Plan / Recommendation CHL IP CLINICAL IMPRESSIONS 11/19/2017 Clinical Impression Pt presents with absent oral response to po presentations of nectar thick liquid or puree. Pt exhibited no awareness of bolus, or attempt to manipulate either consistency. Minimal posterior spillage to the level of the vallecula was observed, however, no swallow reflex was triggered, and pt was unable to follow commands to swallow volitionally.  Pt's oral cavity was cleared with moistened swabs and suction, and still no swallow reflex was initiated. At this time, recommend Palliative Care consult to facilitate establishment of appropriate goals of care. If continued po intake with known high aspiration risk is what family wishes, recommend conservative diet of puree and pudding thick liquids only if pt is awake and alert and demonstrating swallow reflex (present on bedside swallow  assessment). SLP will follow briefly for education regarding strategies to mitigate aspiration risk.  SLP Visit Diagnosis Dysphagia, oropharyngeal phase (R13.12) Impact on safety and function Moderate aspiration risk;Severe aspiration risk;Risk for inadequate nutrition/hydration   CHL IP TREATMENT RECOMMENDATION 11/18/2017 Treatment Recommendations Therapy as outlined in treatment plan below   Prognosis 11/19/2017 Prognosis for Safe Diet Advancement Fair Barriers to Reach Goals Cognitive deficits CHL IP DIET RECOMMENDATION 11/18/2017 SLP Diet Recommendations Pending establishment of [place] with Palliative Care Compensations Slow rate;Minimize environmental distractions;Small sips/bites;Lingual sweep for clearance of pocketing   CHL IP OTHER RECOMMENDATIONS 11/19/2017 Recommended Consults Palliative Care consult Oral Care Recommendations Oral care QID   CHL IP FOLLOW UP RECOMMENDATIONS 11/19/2017 Follow up Recommendations 24 hour supervision/assistance   CHL IP FREQUENCY AND DURATION 11/18/2017 Speech Therapy Frequency (ACUTE ONLY) min 2x/week Treatment Duration 2 weeks      CHL IP ORAL PHASE 11/19/2017 Oral Phase Impaired Oral - Nectar Teaspoon Impaired mastication;Reduced posterior propulsion;Holding of bolus;Premature spillage Oral - Puree Impaired mastication;Reduced posterior propulsion;Holding of bolus;Premature spillage  CHL IP PHARYNGEAL PHASE 11/19/2017 Pharyngeal Phase Impaired Pharyngeal- Nectar Teaspoon and Puree Minimal posterior spillage over tongue base to the level of the vallecular sinus.  CHL IP CERVICAL ESOPHAGEAL PHASE 11/19/2017 Cervical Esophageal Phase No swallow reflex initiated Celia B. Quentin Ore [facility], [place] Speech Language Pathologist [phone] Shonna Chock 11/19/2017, 12:36 PM               Scheduled Meds: . dextrose  25 mL Intravenous Once  . donepezil  10 mg Oral QHS  . enoxaparin (LOVENOX) injection  40 mg Subcutaneous Q24H  . insulin aspart  0-5 Units Subcutaneous QHS  . insulin aspart  0-9 Units  Subcutaneous TID  WC  . memantine  10 mg Oral BID  . potassium chloride  20 mEq Oral Once  . zolpidem  5 mg Oral Once      Time spent: 25 min  [place] [facility] Pager [phone]. If 7PM-7AM, please contact night-coverage at [email or web address], Office  [phone]  password [place]  11/19/2017, 4:02 PM  LOS: 2 days

## 2017-11-20 DIAGNOSIS — E876 Hypokalemia: Secondary | ICD-10-CM

## 2017-11-20 DIAGNOSIS — Z515 Encounter for palliative care: Secondary | ICD-10-CM

## 2017-11-20 DIAGNOSIS — E870 Hyperosmolality and hypernatremia: Secondary | ICD-10-CM

## 2017-11-20 DIAGNOSIS — F0150 Vascular dementia without behavioral disturbance: Secondary | ICD-10-CM

## 2017-11-20 DIAGNOSIS — Z7189 Other specified counseling: Secondary | ICD-10-CM

## 2017-11-20 LAB — BASIC METABOLIC PANEL
ANION GAP: 4 — AB (ref 5–15)
BUN: 11 mg/dL (ref 8–23)
CALCIUM: 7.1 mg/dL — AB (ref 8.9–10.3)
CO2: 25 mmol/L (ref 22–32)
Chloride: 117 mmol/L — ABNORMAL HIGH (ref 98–111)
Creatinine, Ser: 0.59 mg/dL (ref 0.44–1.00)
GFR calc Af Amer: >60 mL/min (ref >60)
GLUCOSE: 163 mg/dL — AB (ref 70–99)
Potassium: 3 mmol/L — ABNORMAL LOW (ref 3.5–5.1)
SODIUM: 146 mmol/L — AB (ref 135–145)

## 2017-11-20 LAB — CULTURE, BLOOD (ROUTINE X 2): SPECIAL REQUESTS: ADEQUATE

## 2017-11-20 LAB — GLUCOSE, CAPILLARY
GLUCOSE-CAPILLARY: 155 mg/dL — AB (ref 70–99)
Glucose-Capillary: 105 mg/dL — ABNORMAL HIGH (ref 70–99)
Glucose-Capillary: 155 mg/dL — ABNORMAL HIGH (ref 70–99)
Glucose-Capillary: 146 mg/dL — ABNORMAL HIGH (ref 70–99)
Glucose-Capillary: 196 mg/dL — ABNORMAL HIGH (ref 70–99)

## 2017-11-20 MED ORDER — POTASSIUM CHLORIDE 10 MEQ/100ML IV SOLN
10.0000 meq | INTRAVENOUS | Status: AC
  Administered 2017-11-20 (×3): 10 meq via INTRAVENOUS
  Filled 2017-11-20 (×3): qty 100

## 2017-11-20 NOTE — Care Management Important Message (Signed)
Important Message  Patient Details  Name: Jill Francis MRN: [id number] Date of Birth: [birth year]   Medicare Important Message Given:  No Did not sign due to illness  Orbie Pyo 11/20/2017, 3:07 PM

## 2017-11-20 NOTE — Clinical Social Work Note (Signed)
Clinical Social Work Assessment  Patient Details  Name: Jill Francis MRN: [id number] Date of Birth: June 25, 1927  Date of referral:  11/20/17               Reason for consult:  Discharge Planning                Permission sought to share information with:  Other(Patient not asked, oriented to self only) Permission granted to share information::  No  Name::        Agency::     Relationship::     Contact Information:     Housing/Transportation Living arrangements for the past 2 months:  Skilled Nursing Facility(Patient is LTC resident at [facility]) Source of Information:  Patient Patient Interpreter Needed:  None Criminal Activity/Legal Involvement Pertinent to Current Situation/Hospitalization:  No - Comment as needed Significant Relationships:  Other Family Members(Niece is POA) Lives with:  Facility Resident(From [facility]) Do you feel safe going back to the place where you live?  Yes Need for family participation in patient care:  Yes (Comment)  Care giving concerns: CSW contacted niece and POA [name] [phone] cell) by phone and she expressed no concerns regarding patient's care at [facility].  Social Worker assessment / plan:  CSW talked with Ms. [name] and confirmed that patient is from [facility], is a LTC resident and will return to this facility at discharge.  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 She was at the bedside and had requested to speak with patient's doctor.  Patient/Family's Understanding of and Emotional Response to Diagnosis, Current Treatment, and Prognosis:  Niece is  understanding and residence and nursing care at the skilled nursing facility.   Emotional Assessment Appearance:  Other (Comment Required(Did not visit room - talked with niece by phone) Attitude/Demeanor/Rapport:  Unable to Assess Affect (typically observed):  Unable to Assess Orientation:  Oriented to Self Alcohol / Substance use:  Tobacco Use, Alcohol Use, Illicit Drugs(Per H&P patient quit smoking and does not drink or use illicit drugs) Psych involvement (Current and /or in the community):  No (Comment)  Discharge Needs  Concerns to be addressed:  Discharge Planning Concerns Readmission within the last 30 days:  No Current discharge risk:  None Barriers to Discharge:  Continued Medical Work up   [employer] Mila Homer, LCSW 11/20/2017, 10:14 AM

## 2017-11-20 NOTE — Consult Note (Signed)
Consultation Note Date: 11/20/2017   Patient Name: Jill Francis  DOB: 1928/02/07  MRN: [id number]  Age / Sex: 84 y.o., female  PCP: Hennie Duos, MD Referring Physician: Oswald Hillock, MD  Reason for Consultation: Establishing goals of care, Hospice Evaluation and Psychosocial/spiritual support  HPI/Patient Profile: 84 y.o. female  with past medical history of advanced dementia, diabetes type 2, peripheral vascular disease, left AKA 2018, dysphasia, history of breast and ovarian cancer admitted on 11/17/2017 with altered mental status and hypernatremia.  Patient's sodium was 166 on admission.  Consult ordered for goals of care in the setting of severe dementia and dysphasia.   Clinical Assessment and Goals of Care: Met with patient, and patient's niece, who is her healthcare power of attorney.  Patient is unable to participate in goals of care because of advanced dementia.  She appears alert but speech is largely gibberish.  She still maintains the ability to smile and reach out her hand but is unable to follow commands. SHe is only eating bites and sips and very intermittently.  A modified barium swallow was performed during this hospital admission and patient initiated no swallow reflex, absent oral response; food had to be physically removed.  Her niece shares that she will occasionally eat pured food especially Magic cups but she is noticing that she is eating less and less.  We did talk about artificial feeding in the setting of dementia as well as other goals of care.  Ms. Luiz Blare, patient's niece, shares that patient's daughter just died in 11/01/2022 (her mother), and she has been dealing with a lot of "end of life issues".  She does seem very informed.  Her aunt has lived 8 years in a facility  Patient's niece, [name] at [phone], is her healthcare power of attorney  SUMMARY OF RECOMMENDATIONS     Confirmed DNR/DNI Allow patient to eat for comfort, pured foods such as Magic cup No feeding tubes either temporary or PEG Return to the facility with hospice support. Consult placed to SW to facilitate hospice referral in the facility [facility] booklet given to [name] Code Status/Advance Care Planning:  DNR   Palliative Prophylaxis:   Aspiration, Bowel Regimen, Delirium Protocol, Eye Care, Frequent Pain Assessment, Oral Care and Turn Reposition  Additional Recommendations (Limitations, Scope, Preferences):  Avoid Hospitalization, Minimize Medications, Initiate Comfort Feeding, No Artificial Feeding, No Chemotherapy, No Hemodialysis, No Radiation, No Surgical Procedures and No Tracheostomy  Psycho-social/Spiritual:   Desire for further Chaplaincy support:no  Additional Recommendations: Referral to Community Resources   Prognosis:   < 6 months in the setting of advanced dementia with severe aspiration risk.  Patient is at high risk for an acute event such as sepsis secondary to aspiration pneumonia  Discharge Planning: Stella with Hospice      Primary Diagnoses: Present on Admission: . Dehydration with hypernatremia   I have reviewed the medical record, interviewed the patient and family, and examined the patient. The following aspects are pertinent.  Past Medical History:  Diagnosis Date  . Allergic rhinitis, unspecified   . Alzheimer's disease 08/19/2012  . Anemia in other chronic diseases classified elsewhere   . Asthma   . Breast cancer ([place])   . Cancer [facility])    Skin cancer  . Dehydration with hypernatremia 11/17/2017  . Depression   . Diabetes mellitus   . Hyperlipidemia   . Hypocalcemia   . Ovarian cancer ([place])   . Personal history of malignant neoplasm of ovary   . Type II or unspecified type diabetes mellitus without mention of complication, uncontrolled 08/19/2012   Social History   Socioeconomic History  .  Marital status: Divorced    Spouse name: Not on file  . Number of children: 0  . Years of education: Not on file  . Highest education level: Not on file  Occupational History  . Occupation: Clinical research associate Needs  . Financial resource strain: Not on file  . Food insecurity:    Worry: Not on file    Inability: Not on file  . Transportation needs:    Medical: Not on file    Non-medical: Not on file  Tobacco Use  . Smoking status: Former Research scientist (life sciences)  . Smokeless tobacco: Never Used  Substance and Sexual Activity  . Alcohol use: No  . Drug use: No  . Sexual activity: Not on file  Lifestyle  . Physical activity:    Days per week: Not on file    Minutes per session: Not on file  . Stress: Not on file  Relationships  . Social connections:    Talks on phone: Not on file    Gets together: Not on file    Attends religious service: Not on file    Active member of club or organization: Not on file    Attends meetings of clubs or organizations: Not on file    Relationship status: Not on file  Other Topics Concern  . Not on file  Social History Narrative   Admitted to [facility] 05/22/09   Divorced   Former smoker   Alcohol none   DNR   Family History  Problem Relation Age of Onset  . Pancreatic cancer Neg Hx    Scheduled Meds: . dextrose  25 mL Intravenous Once  . donepezil  10 mg Oral QHS  . enoxaparin (LOVENOX) injection  40 mg Subcutaneous Q24H  . insulin aspart  0-5 Units Subcutaneous QHS  . insulin aspart  0-9 Units Subcutaneous TID WC  . mouth rinse  15 mL Mouth Rinse BID  . memantine  10 mg Oral BID  . zolpidem  5 mg Oral Once   Continuous Infusions: . dextrose 50 mL/hr at 11/20/17 0700  . potassium chloride 10 mEq (11/20/17 1212)   PRN Meds:.acetaminophen Medications Prior to Admission:  Prior to Admission medications   Medication Sig Start Date End Date Taking? Authorizing Provider  acetaminophen (TYLENOL) 500 MG tablet Take 1,000 mg by mouth 3  (three) times daily. (0900, 1400, & 1900)   Yes [provider]  donepezil (ARICEPT) 10 MG tablet Take 10 mg by mouth at bedtime.  10/28/10  Yes [provider]  Insulin Glargine (TOUJEO SOLOSTAR) 300 UNIT/ML SOPN Inject 12 Units into the skin at bedtime.    Yes [provider]  insulin lispro (HUMALOG) 100 UNIT/ML injection Inject 0-10 Units into the skin 4 (four) times daily -  before meals and at bedtime. Per sliding scale: 0-150= 0units, 151-200=2  units, 201-250= 4units, 251-300=6units, 301-350=8units, 351-400=10units.   Yes [provider]  memantine (NAMENDA) 10 MG tablet Take 10 mg by mouth 2 (two) times daily.    Yes [provider]  Menthol, Topical Analgesic, (BIOFREEZE) 4 % GEL Apply 1 application topically 2 (two) times daily. Apply to right lower leg and foot for chronic pain   Yes [provider]  Multiple Vitamin (MULTIVITAMIN WITH MINERALS) TABS tablet Take 1 tablet by mouth daily.   Yes [provider]  sitaGLIPtin (JANUVIA) 100 MG tablet Take 100 mg by mouth at bedtime.    Yes [provider]  Vitamin D, Ergocalciferol, (DRISDOL) 50000 units CAPS capsule Take 50,000 Units by mouth every 30 (thirty) days.   Yes [provider]   Allergies  Allergen Reactions  . Gabapentin     UNSPECIFIED REACTION   . Iodine     UNSPECIFIED REACTION   . Sulfonamide Derivatives     UNSPECIFIED REACTION    Review of Systems  Unable to perform ROS: Dementia    Physical Exam  Constitutional:  Elderly female with advanced dementia; no acute distress  HENT:  Head: Normocephalic and atraumatic.  Cardiovascular: Normal rate.  Pulmonary/Chest: Effort normal.  Abdominal: Soft.  Neurological: She is alert.  Speech is largely gibberish however she will smile and hold out her hand when you go into speak to her  Skin: Skin is warm and dry.  Psychiatric:  No overt agitation otherwise unable to test  Nursing note and  vitals reviewed.   Vital Signs: BP (!) 96/48 (BP Location: Left Arm)   Pulse 68   Temp 98.3 F (36.8 C) (Axillary)   Resp 16   Ht '5\' 4"'$  (1.626 m)   Wt 53.2 kg   SpO2 99%   BMI 20.13 kg/m  Pain Scale: PAINAD   Pain Score: 1    SpO2: SpO2: 99 % O2 Device:SpO2: 99 % O2 Flow Rate: .O2 Flow Rate (L/min): 2 L/min  IO: Intake/output summary:   Intake/Output Summary (Last 24 hours) at 11/20/2017 1308 Last data filed at 11/20/2017 0911 Gross per 24 hour  Intake 601.46 ml  Output 575 ml  Net 26.46 ml    LBM: Last BM Date: 11/20/17 Baseline Weight: Weight: 53.5 kg Most recent weight: Weight: 53.2 kg     Palliative Assessment/Data:   Flowsheet Rows     Most Recent Value  Intake Tab  Referral Department  Hospitalist  Unit at Time of Referral  Med/Surg Unit  Palliative Care Primary Diagnosis  Pulmonary  Date Notified  11/19/17  Palliative Care Type  New Palliative care  Reason for referral  Clarify Goals of Care  Date of Admission  11/17/17  Date first seen by Palliative Care  11/20/17  # of days Palliative referral response time  1 Day(s)  # of days IP prior to Palliative referral  2  Clinical Assessment  Palliative Performance Scale Score  30%  Pain Max last 24 hours  Not able to report  Pain Min Last 24 hours  Not able to report  Dyspnea Max Last 24 Hours  Not able to report  Dyspnea Min Last 24 hours  Not able to report  Nausea Max Last 24 Hours  Not able to report  Nausea Min Last 24 Hours  Not able to report  Anxiety Max Last 24 Hours  Not able to report  Anxiety Min Last 24 Hours  Not able to report  Other Max Last 24 Hours  Not  able to report  Psychosocial & Spiritual Assessment  Palliative Care Outcomes  Patient/Family meeting held?  Yes  Who was at the meeting?  pt, niece HCPOA  Palliative Care Outcomes  Transitioned to hospice, Provided psychosocial or spiritual support, Clarified goals of care  Patient/Family wishes: Interventions discontinued/not started    Mechanical Ventilation, Tube feedings/TPN, PEG, Trach      Time In: 1100 Time Out: 1155 Time Total: 55 min Greater than 50%  of this time was spent counseling and coordinating care related to the above assessment and plan. Staffed with Dr. Darrick Meigs  Signed by: Dory Horn, NP   Please contact Palliative Medicine Team phone at [phone] for questions and concerns.  For individual provider: See Shea Evans

## 2017-11-20 NOTE — Progress Notes (Signed)
[facility]  PROGRESS NOTE  Jill Francis WRU:[id number] DOB: [birth year] DOA: 11/17/2017 PCP: Hennie Duos, MD   Brief HPI:   84 year old female with a history of advanced dementia, uncontrolled type 2 diabetes mellitus, peripheral vascular disease, status post left above-knee amputation, oral dysphagia on pured diet was sent to the ED from skilled facility as patient was found to be less responsive with decreased p.o. intake.  In the ED patient was found to be severely hyponatremic with sodium 166, chloride 130.    Subjective   Patient seen and examined, currently n.p.o. due to failed swallowing study.  Discussed with patient's daughter at bedside.   Assessment/Plan:     1. Hypernatremia-patient came with sodium of 166, started on D5 W, sodium is slowly improved today labs show sodium 146.  Continue with D5W at 50 mL/h.  Follow BMP in a.m.   2. Hypokalemia-potassium is 3.0, will replace potassium with IV KCl 10 mEq x 3.  Check BMP in a.m.  3. Dysphagia-MBS showed no response to food bolus, discussed with patient's daughter at bedside she is okay with starting comfort feeds with understanding the patient has been doing that recurrent aspiration pneumonia.  Will restart pured diet.  4. Chronic atrial fibrillation-heart rate is controlled, patient is not on any AV node blocking medication.  No anti-Coagulation due to high risk of fall and bleeding.  Last echo from October 2018 showed EF 55 to 60% with normal wall motion.  Continue monitoring on telemetry.  5. Uncontrolled diabetes mellitus type 2-last hemoglobin A1c was 7.1 in April 2018.  Blood glucoses worsened since patient started on D5W.  Continue sliding scale insulin with NovoLog.  Will closely monitor patient's blood glucose, today blood glucose is better,  if persistently elevated might add Lantus 5 units subcu daily.  6. Severe peripheral vascular disease-she is status post left above-the-knee amputation, use  wheelchair at baseline.  Not on antiplatelet therapy.  7. Hypokalemia-replete  8. Dementia-we will restart home medication, Aricept 10 mg p.o. Daily  9. Coagulase-negative staph bacteremia-1 out of 2 bottles, likely contamination.     CBG: Recent Labs  Lab 11/19/17 1333 11/19/17 1622 11/19/17 2130 11/20/17 0720 11/20/17 1143  GLUCAP 226* 166* 105* 155* 155*    CBC: Recent Labs  Lab 11/17/17 1405 11/17/17 1418 11/18/17 0626  WBC 7.7  --  7.3  NEUTROABS 5.1  --   --   HGB 11.7* 11.6* 10.8*  HCT 39.4 34.0* 37.1  MCV 101.8*  --  103.1*  PLT 238  --  140*    Basic Metabolic Panel: Recent Labs  Lab 11/17/17 1803  11/19/17 0220 11/19/17 0641 11/19/17 1105 11/19/17 1431 11/20/17 1016  NA 165*   < > 154* 153* 153* 152* 146*  K 3.8   < > 4.1 3.4* 3.4* 3.7 3.0*  CL >130*   < > 124* 121* 123* 120* 117*  CO2 26   < > 23 24 24 25 25   GLUCOSE 144*   < > 298* 303* 242* 243* 163*  BUN 25*   < > 17 16 15 17 11   CREATININE 0.81   < > 0.76 0.72 0.72 0.74 0.59  CALCIUM 7.9*   < > 7.4* 7.4* 7.5* 7.5* 7.1*  MG 2.4  --   --   --   --   --   --   PHOS 2.9  --   --   --   --   --   --    < > =  values in this interval not displayed.     DVT prophylaxis: Lovenox  Code Status: Full code  Family Communication: No family at bedside  Disposition Plan: likely home when medically ready for discharge   Consultants:    Procedures:     Antibiotics:   Anti-infectives (From admission, onward)   None       Objective   Vitals:   11/19/17 1700 11/19/17 2128 11/20/17 0517 11/20/17 0926  BP: (!) 114/48 103/69 100/65 (!) 96/48  Pulse: 68 60 70 68  Resp: 18 15 16 16   Temp: 98.6 F (37 C) 99 F (37.2 C) 97.9 F (36.6 C) 98.3 F (36.8 C)  TempSrc: Oral Oral  Axillary  SpO2: 96% 95% 98% 99%  Weight:  53.2 kg    Height:        Intake/Output Summary (Last 24 hours) at 11/20/2017 1424 Last data filed at 11/20/2017 0911 Gross per 24 hour  Intake 601.46 ml  Output 500  ml  Net 101.46 ml   Filed Weights   11/17/17 1700 11/18/17 1227 11/19/17 2128  Weight: 53.5 kg 53.5 kg 53.2 kg     Physical Examination:    General: Appears lethargic  Cardiovascular: S1-S2, regular, no murmurs  Respiratory: Clear to auscultation bilaterally  Abdomen: Soft, nontender, no organomegaly  Musculoskeletal: No edema of the lower extremities, status post left above-knee amputation       Data Reviewed: I have personally reviewed following labs and imaging studies   Recent Results (from the past 240 hour(s))  Culture, blood (routine x 2)     Status: None (Preliminary result)   Collection Time: 11/17/17  2:40 PM  Result Value Ref Range Status   Specimen Description BLOOD BLOOD RIGHT HAND  Final   Special Requests   Final    BOTTLES DRAWN AEROBIC AND ANAEROBIC Blood Culture adequate volume   Culture   Final    NO GROWTH 2 DAYS Performed at [facility], [address] [place], [place] [address]    Report Status PENDING  Incomplete  Culture, blood (routine x 2)     Status: Abnormal   Collection Time: 11/17/17  2:43 PM  Result Value Ref Range Status   Specimen Description BLOOD RIGHT ANTECUBITAL  Final   Special Requests   Final    BOTTLES DRAWN AEROBIC AND ANAEROBIC Blood Culture adequate volume   Culture  Setup Time   Final    GRAM POSITIVE COCCI IN BOTH AEROBIC AND ANAEROBIC BOTTLES CRITICAL RESULT CALLED TO, READ BACK BY AND VERIFIED WITH: PHARMD M PHAM [phone] MWILSON    Culture (A)  Final    STAPHYLOCOCCUS SPECIES (COAGULASE NEGATIVE) THE SIGNIFICANCE OF ISOLATING THIS ORGANISM FROM A SINGLE SET OF BLOOD CULTURES WHEN MULTIPLE SETS ARE DRAWN IS UNCERTAIN. PLEASE NOTIFY THE MICROBIOLOGY DEPARTMENT WITHIN ONE WEEK IF SPECIATION AND SENSITIVITIES ARE REQUIRED. Performed at [facility], [place] [address], [place], [place] [address]    Report Status 11/20/2017 FINAL  Final  Blood Culture ID Panel (Reflexed)     Status: Abnormal    Collection Time: 11/17/17  2:43 PM  Result Value Ref Range Status   Enterococcus species NOT DETECTED NOT DETECTED Final   Listeria monocytogenes NOT DETECTED NOT DETECTED Final   Staphylococcus species DETECTED (A) NOT DETECTED Final    Comment: Methicillin (oxacillin) susceptible coagulase negative staphylococcus. Possible blood culture contaminant (unless isolated from more than one blood culture draw or clinical case suggests pathogenicity). No antibiotic treatment is  indicated for blood  culture contaminants. CRITICAL RESULT CALLED TO, READ BACK BY AND VERIFIED WITH: Jene Every PharmD 10:30 11/18/17 ([email or web address])    Staphylococcus aureus NOT DETECTED NOT DETECTED Final   Methicillin resistance NOT DETECTED NOT DETECTED Final   Streptococcus species NOT DETECTED NOT DETECTED Final   Streptococcus agalactiae NOT DETECTED NOT DETECTED Final   Streptococcus pneumoniae NOT DETECTED NOT DETECTED Final   Streptococcus pyogenes NOT DETECTED NOT DETECTED Final   Acinetobacter baumannii NOT DETECTED NOT DETECTED Final   Enterobacteriaceae species NOT DETECTED NOT DETECTED Final   Enterobacter cloacae complex NOT DETECTED NOT DETECTED Final   Escherichia coli NOT DETECTED NOT DETECTED Final   Klebsiella oxytoca NOT DETECTED NOT DETECTED Final   Klebsiella pneumoniae NOT DETECTED NOT DETECTED Final   Proteus species NOT DETECTED NOT DETECTED Final   Serratia marcescens NOT DETECTED NOT DETECTED Final   Haemophilus influenzae NOT DETECTED NOT DETECTED Final   Neisseria meningitidis NOT DETECTED NOT DETECTED Final   Pseudomonas aeruginosa NOT DETECTED NOT DETECTED Final   Candida albicans NOT DETECTED NOT DETECTED Final   Candida glabrata NOT DETECTED NOT DETECTED Final   Candida krusei NOT DETECTED NOT DETECTED Final   Candida parapsilosis NOT DETECTED NOT DETECTED Final   Candida tropicalis NOT DETECTED NOT DETECTED Final    Comment: Performed at [facility], [address], [place],  [place] [address]  Culture, Urine     Status: Abnormal   Collection Time: 11/17/17  3:15 PM  Result Value Ref Range Status   Specimen Description URINE, RANDOM  Final   Special Requests ADDED ON 11/18/17 AT 1639  Final   Culture (A)  Final    <10,000 COLONIES/mL INSIGNIFICANT GROWTH Performed at [facility], [place] [address], [place], [place] [address]    Report Status 11/19/2017 FINAL  Final  MRSA PCR Screening     Status: None   Collection Time: 11/18/17 11:21 AM  Result Value Ref Range Status   MRSA by PCR NEGATIVE NEGATIVE Final    Comment:        The GeneXpert MRSA Assay (FDA approved for NASAL specimens only), is one component of a comprehensive MRSA colonization surveillance program. It is not intended to diagnose MRSA infection nor to guide or monitor treatment for MRSA infections. Performed at [facility], [place] [address] [place], [place] [address]      Liver Function Tests: Recent Labs  Lab 11/17/17 1405  AST 18  ALT 17  ALKPHOS 66  BILITOT 0.3  PROT 6.2*  ALBUMIN 2.6*   Recent Labs  Lab 11/17/17 1405  LIPASE 25   No results for input(s): AMMONIA in the last 168 hours.  Cardiac Enzymes: Recent Labs  Lab 11/17/17 1405  TROPONINI <0.03   BNP (last 3 results) No results for input(s): BNP in the last 8760 hours.  ProBNP (last 3 results) No results for input(s): PROBNP in the last 8760 hours.    Studies: Dg Swallowing Func-speech Pathology  Result Date: 11/19/2017 Objective Swallowing Evaluation: Type of Study: MBS-Modified Barium Swallow Study  Patient Details Name: DEUNDRA FURBER MRN: [id number] Date of Birth: [birth year] Today's Date: 11/19/2017 Time: SLP Start Time (ACUTE ONLY): 71 -SLP Stop Time (ACUTE ONLY): 1200 SLP Time Calculation (min) (ACUTE ONLY): 30 min Past Medical History: Past Medical History: Diagnosis Date . Allergic rhinitis, unspecified  . Alzheimer's disease 08/19/2012 . Anemia in other chronic diseases classified elsewhere  .  Asthma  . Breast cancer ([place])  .  Cancer [facility])   Skin cancer . Dehydration with hypernatremia 11/17/2017 . Depression  . Diabetes mellitus  . Hyperlipidemia  . Hypocalcemia  . Ovarian cancer ([place])  . Personal history of malignant neoplasm of ovary  . Type II or unspecified type diabetes mellitus without mention of complication, uncontrolled 08/19/2012 Past Surgical History: Past Surgical History: Procedure Laterality Date . AMPUTATION Left 12/18/2016  Procedure: AMPUTATION ABOVE KNEE LEFT;  Surgeon: Angelia Mould, MD;  Location: [facility];  Service: Vascular;  Laterality: Left; . BACK SURGERY   . BREAST SURGERY  right partial mastectomy  06/2003 . KNEE SURGERY   . SKIN CANCER EXCISION   HPI: SYREETA FIGLER is a 84 y.o. female with medical history significant for advanced dementia, uncontrolled type II diabetes, severe peripheral vascular disease status status post left above the knee amputation, oral dysphagia on pured diet presented to ED [facility] from SNF after being noted to be less responsive with decrease in p.o. intake (10 lb weight loss in 6 months). CXR No active disease. Cardiomegaly.  Subjective: Pt seen in radiology for MBS. Niece present Assessment / Plan / Recommendation CHL IP CLINICAL IMPRESSIONS 11/19/2017 Clinical Impression Pt presents with absent oral response to po presentations of nectar thick liquid or puree. Pt exhibited no awareness of bolus, or attempt to manipulate either consistency. Minimal posterior spillage to the level of the vallecula was observed, however, no swallow reflex was triggered, and pt was unable to follow commands to swallow volitionally.  Pt's oral cavity was cleared with moistened swabs and suction, and still no swallow reflex was initiated. At this time, recommend Palliative Care consult to facilitate establishment of appropriate goals of care. If continued po intake with known high aspiration risk is what family wishes, recommend conservative diet of puree and pudding thick  liquids only if pt is awake and alert and demonstrating swallow reflex (present on bedside swallow assessment). SLP will follow briefly for education regarding strategies to mitigate aspiration risk.  SLP Visit Diagnosis Dysphagia, oropharyngeal phase (R13.12) Impact on safety and function Moderate aspiration risk;Severe aspiration risk;Risk for inadequate nutrition/hydration   CHL IP TREATMENT RECOMMENDATION 11/18/2017 Treatment Recommendations Therapy as outlined in treatment plan below   Prognosis 11/19/2017 Prognosis for Safe Diet Advancement Fair Barriers to Reach Goals Cognitive deficits CHL IP DIET RECOMMENDATION 11/18/2017 SLP Diet Recommendations Pending establishment of [place] with Palliative Care Compensations Slow rate;Minimize environmental distractions;Small sips/bites;Lingual sweep for clearance of pocketing   CHL IP OTHER RECOMMENDATIONS 11/19/2017 Recommended Consults Palliative Care consult Oral Care Recommendations Oral care QID   CHL IP FOLLOW UP RECOMMENDATIONS 11/19/2017 Follow up Recommendations 24 hour supervision/assistance   CHL IP FREQUENCY AND DURATION 11/18/2017 Speech Therapy Frequency (ACUTE ONLY) min 2x/week Treatment Duration 2 weeks      CHL IP ORAL PHASE 11/19/2017 Oral Phase Impaired Oral - Nectar Teaspoon Impaired mastication;Reduced posterior propulsion;Holding of bolus;Premature spillage Oral - Puree Impaired mastication;Reduced posterior propulsion;Holding of bolus;Premature spillage  CHL IP PHARYNGEAL PHASE 11/19/2017 Pharyngeal Phase Impaired Pharyngeal- Nectar Teaspoon and Puree Minimal posterior spillage over tongue base to the level of the vallecular sinus.  CHL IP CERVICAL ESOPHAGEAL PHASE 11/19/2017 Cervical Esophageal Phase No swallow reflex initiated Celia B. Quentin Ore [facility] [place] Speech Language Pathologist [phone] Shonna Chock 11/19/2017, 12:36 PM               Scheduled Meds: . dextrose  25 mL Intravenous Once  . donepezil  10 mg Oral QHS  . enoxaparin (LOVENOX) injection   40 mg Subcutaneous  Q24H  . insulin aspart  0-5 Units Subcutaneous QHS  . insulin aspart  0-9 Units Subcutaneous TID WC  . mouth rinse  15 mL Mouth Rinse BID  . memantine  10 mg Oral BID  . zolpidem  5 mg Oral Once      Time spent: 25 min  [facility] Pager [phone]. If 7PM-7AM, please contact night-coverage at [email or web address], Office  [phone]  password [place]  11/20/2017, 2:24 PM  LOS: 3 days

## 2017-11-20 NOTE — Clinical Social Work Note (Signed)
CSW advised by MD that patient will discharge back to facility tomorrow. Informed MD that request for patient to have Hospice services must be in his discharge summary. Olivia Mackie, admissions director at [facility] contacted and advised. Handoff completed for weekend CSW.  Payten Beaumier Givens, MSW, LCSW Licensed Clinical Social Worker [place] [phone]

## 2017-11-20 NOTE — NC FL2 (Addendum)
[place] LEVEL OF CARE SCREENING TOOL     IDENTIFICATION  Patient Name: Jill Francis Birthdate: 05/20/1927 Sex: female Admission Date (Current Location): 11/17/2017  [place] and [place] Number:  Kathleen Argue [phone] [place] and Address:  [facility] [place] [address] [place], [place] [address]      Provider Number: [id number]  Attending Physician Name and Address:  Oswald Hillock, MD  Relative Name and Phone Number:  [name] - niece and POA - [phone] (mobile), [phone] (home)    Current Level of Care: Hospital Recommended Level of Care: [place] Prior Approval Number:    Date Approved/Denied:   PASRR Number: [id number]  Discharge Plan: SNF([facility])    Current Diagnoses: Patient Active Problem List   Diagnosis Date Noted  . Malnutrition of moderate degree 11/18/2017  . Dehydration with hypernatremia 11/17/2017  . Gangrene of left foot ([place]) 12/18/2016  . Breast cancer ([place])   . Osteoporosis 06/23/2015  . Bradycardia 09/18/2014  . Prolonged QT interval 09/18/2014  . Hypocalcemia 05/27/2014  . FTT (failure to thrive) in adult 05/27/2014  . Dysphagia, oral phase 04/26/2014  . CKD (chronic kidney disease) stage 2, GFR 60-89 ml/min 04/26/2014  . Type 2 diabetes mellitus without complication ([place]) 76/54/6503  . Hyperlipidemia   . Chronic renal disease, stage 2, mildly decreased glomerular filtration rate between 60-89 mL/min/1.73 square meter 10/14/2013  . Cough 06/29/2013  . Hypoglycemia 01/16/2013  . Psychosis ([place]) 09/20/2012  . Diabetes mellitus type 2, uncontrolled, without complications ([place]) 54/65/6812  . Dementia with behavioral disturbance 08/19/2012  . Iron deficiency anemia 08/19/2012  . Depression 08/19/2012  . Malignant neoplasm of female breast ([place]) 06/30/2008  . ADENOCARCINOMA, OVARY 06/29/2008  . Anxiety state 01/16/2007    Orientation RESPIRATION BLADDER Height & Weight      (Disoriented x4)  Normal External catheter(placed 9/3) Weight: 117 lb 4.6 oz (53.2 kg) Height:  5\' 4"  (162.6 cm)  BEHAVIORAL SYMPTOMS/MOOD NEUROLOGICAL BOWEL NUTRITION STATUS      Continent Diet(NPO)  AMBULATORY STATUS COMMUNICATION OF NEEDS Skin   Total Care(Left AKA) Verbally - Low vocal intensity Other (Comment)(Abrasion right foot)                       Personal Care Assistance Level of Assistance  Bathing, Feeding, Dressing Bathing Assistance: Maximum assistance Feeding assistance: Maximum assistance Dressing Assistance: Maximum assistance     Functional Limitations Info  Sight, Hearing, Speech Sight Info: Adequate Hearing Info: Adequate Speech Info: Adequate    SPECIAL CARE FACTORS FREQUENCY  Speech therapy(DYS 1 puree diet recommended)             Speech Therapy Frequency: Evaluated 9/4      Contractures Contractures Info: Not present    Additional Factors Info  Code Status, Allergies, Insulin Sliding Scale Code Status Info: DNR Allergies Info: Gabapentin, Iodine, Sulfonamide derivatives   Insulin Sliding Scale Info: 0-5 Units daily at bedtime       Current Medications (11/20/2017):  This is the current hospital active medication list Current Facility-Administered Medications  Medication Dose Route Frequency Provider Last Rate Last Dose  . acetaminophen (TYLENOL) tablet 650 mg  650 mg Oral Q6H PRN Kayleen Memos, DO   650 mg at 11/18/17 2235  . dextrose 5 % solution   Intravenous Continuous Irene Pap N, DO 50 mL/hr at 11/20/17 0700    . dextrose 50 % solution 25 mL  25 mL Intravenous Once Sheppard Coil,  Noah Delaine, MD      . donepezil (ARICEPT) tablet 10 mg  10 mg Oral QHS Oswald Hillock, MD   10 mg at 11/18/17 2234  . enoxaparin (LOVENOX) injection 40 mg  40 mg Subcutaneous Q24H Hall, Carole N, DO   40 mg at 11/19/17 1845  . insulin aspart (novoLOG) injection 0-5 Units  0-5 Units Subcutaneous QHS Kayleen Memos, DO   3 Units at 11/18/17 2230  . insulin  aspart (novoLOG) injection 0-9 Units  0-9 Units Subcutaneous TID WC Irene Pap N, DO   2 Units at 11/20/17 0739  . MEDLINE mouth rinse  15 mL Mouth Rinse BID Oswald Hillock, MD   15 mL at 11/19/17 2215  . memantine (NAMENDA) tablet 10 mg  10 mg Oral BID Oswald Hillock, MD   10 mg at 11/18/17 2235  . potassium chloride SA (K-DUR,KLOR-CON) CR tablet 20 mEq  20 mEq Oral Once [place], Marge Duncans, MD      . zolpidem (AMBIEN) tablet 5 mg  5 mg Oral Once Schorr, Rhetta Mura, NP         Discharge Medications: Please see discharge summary for a list of discharge medications.  Relevant Imaging Results:  Relevant Lab Results:   Additional Information ss#[id number]  Sable Feil, LCSW

## 2017-11-21 DIAGNOSIS — Z7189 Other specified counseling: Secondary | ICD-10-CM

## 2017-11-21 DIAGNOSIS — F0150 Vascular dementia without behavioral disturbance: Secondary | ICD-10-CM

## 2017-11-21 LAB — BASIC METABOLIC PANEL
Anion gap: 5 (ref 5–15)
BUN: 8 mg/dL (ref 8–23)
CHLORIDE: 114 mmol/L — AB (ref 98–111)
CO2: 24 mmol/L (ref 22–32)
CREATININE: 0.64 mg/dL (ref 0.44–1.00)
Calcium: 7.1 mg/dL — ABNORMAL LOW (ref 8.9–10.3)
GFR calc Af Amer: >60 mL/min (ref >60)
GFR calc non Af Amer: >60 mL/min (ref >60)
Glucose, Bld: 205 mg/dL — ABNORMAL HIGH (ref 70–99)
Potassium: 3.4 mmol/L — ABNORMAL LOW (ref 3.5–5.1)
Sodium: 143 mmol/L (ref 135–145)

## 2017-11-21 LAB — GLUCOSE, CAPILLARY
GLUCOSE-CAPILLARY: 190 mg/dL — AB (ref 70–99)
GLUCOSE-CAPILLARY: 271 mg/dL — AB (ref 70–99)

## 2017-11-21 MED ORDER — POTASSIUM CHLORIDE CRYS ER 20 MEQ PO TBCR
20.0000 meq | EXTENDED_RELEASE_TABLET | Freq: Once | ORAL | Status: AC
  Administered 2017-11-21: 20 meq via ORAL
  Filled 2017-11-21: qty 1

## 2017-11-21 NOTE — Clinical Social Work Note (Signed)
Clinical Social Worker facilitated patient discharge including contacting patient family and facility to confirm patient discharge plans.  Clinical information faxed to facility and family agreeable with plan.  CSW arranged ambulance transport via [facility] to [facility].  RN to call [phone] for report prior to discharge.  Clinical Social Worker will sign off for now as social work intervention is no longer needed. Please consult us again if new need arises.  Gerline Ratto, LCSWA [phone]  

## 2017-11-21 NOTE — Progress Notes (Addendum)
Patient niece, Jill Francis, called and reports that upon return to the nursing home that the family was advised that the patient has two open wounds to her buttocks that were "oozing". Patient niece voicing concern that she was not informed. Per nursing report she had mild MASD to buttocks that was noted during cleaning 1 hour prior to discharge. With no oozing noted.   Patient niece request follow-up. Information forwarded to [facility], A.D.   Dorthey Sawyer, RN

## 2017-11-21 NOTE — Progress Notes (Signed)
As we cleaned the patient due to her recent bowel movement ,found MASD on both buttocks as per reported.Skin intact on both buttock .Barrier cream applied.

## 2017-11-21 NOTE — Clinical Social Work Placement (Signed)
   CLINICAL SOCIAL WORK PLACEMENT  NOTE  Date:  11/21/2017  Patient Details  Name: Jill Francis MRN: [id number] Date of Birth: Nov 09, 1927  Clinical Social Work is seeking post-discharge placement for this patient at the [place] level of care (*CSW will initial, date and re-position this form in  chart as items are completed):      Patient/family provided with [facility] Work Department's list of facilities offering this level of care within the geographic area requested by the patient (or if unable, by the patient's family).  Yes   Patient/family informed of their freedom to choose among providers that offer the needed level of care, that participate in Medicare, Medicaid or managed care program needed by the patient, have an available bed and are willing to accept the patient.      Patient/family informed of [facility]'s ownership interest in [facility] and [facility], as well as of the fact that they are under no obligation to receive care at these facilities.  PASRR submitted to EDS on       PASRR number received on 11/20/17     Existing PASRR number confirmed on       FL2 transmitted to all facilities in geographic area requested by pt/family on 11/20/17     FL2 transmitted to all facilities within larger geographic area on       Patient informed that his/her managed care company has contracts with or will negotiate with certain facilities, including the following:        Yes   Patient/family informed of bed offers received.  Patient chooses bed at [facility]     Physician recommends and patient chooses bed at      Patient to be transferred to [facility] on 11/21/17.  Patient to be transferred to facility by Maurine Minister     Patient family notified on 11/21/17 of transfer.  Name of family member notified:  [facility]     PHYSICIAN       Additional Comment:    _______________________________________________ Eileen Stanford, LCSW 11/21/2017, 12:38 PM

## 2017-11-21 NOTE — Discharge Summary (Signed)
Physician Discharge Summary  Jill Francis IHW:[id number] DOB: 06-10-1927 DOA: 11/17/2017  PCP: Hennie Duos, MD  Admit date: 11/17/2017 Discharge date: 11/21/2017  Time spent: 25 minutes  Recommendations for Outpatient Follow-up:  1. Hospice consult at the SNF   Discharge Diagnoses:  Active Problems:   Dehydration with hypernatremia   Malnutrition of moderate degree   Hypernatremia   Vascular dementia without behavioral disturbance   Goals of care, counseling/discussion   Palliative care by specialist   Discharge Condition: Stable  Diet recommendation: Dysphagia 1 die, with pudding thick liquid  Filed Weights   11/17/17 1700 11/18/17 1227 11/19/17 2128  Weight: 53.5 kg 53.5 kg 53.2 kg    History of present illness:  84 year old female with a history of advanced dementia, uncontrolled type 2 diabetes mellitus, peripheral vascular disease, status post left above-knee amputation, oral dysphagia on pured diet was sent to the ED from skilled facility as patient was found to be less responsive with decreased p.o. intake.  In the ED patient was found to be severely hyponatremic with sodium 166, chloride 130.   Hospital Course:  1. Hypernatremia-patient came with sodium of 166, started on D5 W, sodium is slowly improved today labs show sodium 143 .   2. Dysphagia-MBS showed no response to food bolus, will make her n.p.o. palliative medicine consult obtained, No feeding tube, started on comfort feeding with acceptable aspiration risk.    3. Chronic atrial fibrillation-heart rate is controlled, patient is not on any AV node blocking medication.  No anti-Coagulation due to high risk of fall and bleeding.  Last echo from October 2018 showed EF 55 to 60% with normal wall motion.    4. Diabetes mellitus type 2-last hemoglobin A1c was 7.1 in April 2018.  continue SitaGliptin, Lantus, SSI  5. Severe peripheral vascular disease-she is status post left above-the-knee amputation, use  wheelchair at baseline.  Not on antiplatelet therapy.  6. Hypokalemia- potassium is 3.4, will give one dose of K dur 20 meq po x1 before discharge  7. Dementia- continue Aricept, nemenda  8. Coagulase-negative staph bacteremia-1 out of 2 bottles, likely contamination.  Procedures:  MBS  Consultations:  Palliative care  Discharge Exam: Vitals:   11/20/17 2059 11/21/17 0549  BP: 110/60 109/60  Pulse:  62  Resp:  15  Temp:  98.6 F (37 C)  SpO2:  94%    General: Appears in no acute distress Cardiovascular: S1S2 RRR Respiratory: Clear bilaterally  Discharge Instructions   Discharge Instructions    Diet - low sodium heart healthy   Complete by:  As directed    Increase activity slowly   Complete by:  As directed      Allergies as of 11/21/2017      Reactions   Gabapentin    UNSPECIFIED REACTION    Iodine    UNSPECIFIED REACTION    Sulfonamide Derivatives    UNSPECIFIED REACTION       Medication List    TAKE these medications   acetaminophen 500 MG tablet Commonly known as:  TYLENOL Take 1,000 mg by mouth 3 (three) times daily. (0900, 1400, & 1900)   BIOFREEZE 4 % Gel Generic drug:  Menthol (Topical Analgesic) Apply 1 application topically 2 (two) times daily. Apply to right lower leg and foot for chronic pain   donepezil 10 MG tablet Commonly known as:  ARICEPT Take 10 mg by mouth at bedtime.   insulin lispro 100 UNIT/ML injection Commonly known as:  HUMALOG Inject 0-10 Units  into the skin 4 (four) times daily -  before meals and at bedtime. Per sliding scale: 0-150= 0units, 151-200=2 units, 201-250= 4units, 251-300=6units, 301-350=8units, 351-400=10units.   memantine 10 MG tablet Commonly known as:  NAMENDA Take 10 mg by mouth 2 (two) times daily.   multivitamin with minerals Tabs tablet Take 1 tablet by mouth daily.   sitaGLIPtin 100 MG tablet Commonly known as:  JANUVIA Take 100 mg by mouth at bedtime.   TOUJEO SOLOSTAR 300 UNIT/ML  Sopn Generic drug:  Insulin Glargine Inject 12 Units into the skin at bedtime.   Vitamin D (Ergocalciferol) 50000 units Caps capsule Commonly known as:  DRISDOL Take 50,000 Units by mouth every 30 (thirty) days.      Allergies  Allergen Reactions  . Gabapentin     UNSPECIFIED REACTION   . Iodine     UNSPECIFIED REACTION   . Sulfonamide Derivatives     UNSPECIFIED REACTION       The results of significant diagnostics from this hospitalization (including imaging, microbiology, ancillary and laboratory) are listed below for reference.    Significant Diagnostic Studies: Dg Chest 1 View  Result Date: 11/17/2017 CLINICAL DATA:  Weakness EXAM: CHEST  1 VIEW COMPARISON:  10/17/2016 FINDINGS: Surgical clips at the base of the right neck. Cardiomegaly. Aortic atherosclerosis. No acute consolidation or effusion. No pneumothorax. Chronic widening of right AC joint with probable resection of the distal clavicle IMPRESSION: No active disease.  Cardiomegaly Electronically Signed   By: Donavan Foil M.D.   On: 11/17/2017 15:33   Dg Swallowing Func-speech Pathology  Result Date: 11/19/2017 Objective Swallowing Evaluation: Type of Study: MBS-Modified Barium Swallow Study  Patient Details Name: Jill Francis MRN: [id number] Date of Birth: 28-Aug-1927 Today's Date: 11/19/2017 Time: SLP Start Time (ACUTE ONLY): 52 -SLP Stop Time (ACUTE ONLY): 1200 SLP Time Calculation (min) (ACUTE ONLY): 30 min Past Medical History: Past Medical History: Diagnosis Date . Allergic rhinitis, unspecified  . Alzheimer's disease 08/19/2012 . Anemia in other chronic diseases classified elsewhere  . Asthma  . Breast cancer ([place])  . Cancer [facility])   Skin cancer . Dehydration with hypernatremia 11/17/2017 . Depression  . Diabetes mellitus  . Hyperlipidemia  . Hypocalcemia  . Ovarian cancer ([place])  . Personal history of malignant neoplasm of ovary  . Type II or unspecified type diabetes mellitus without mention of complication,  uncontrolled 08/19/2012 Past Surgical History: Past Surgical History: Procedure Laterality Date . AMPUTATION Left 12/18/2016  Procedure: AMPUTATION ABOVE KNEE LEFT;  Surgeon: Angelia Mould, MD;  Location: [facility];  Service: Vascular;  Laterality: Left; . BACK SURGERY   . BREAST SURGERY  right partial mastectomy  06/2003 . KNEE SURGERY   . SKIN CANCER EXCISION   HPI: Jill Francis is a 84 y.o. female with medical history significant for advanced dementia, uncontrolled type II diabetes, severe peripheral vascular disease status status post left above the knee amputation, oral dysphagia on pured diet presented to ED [facility] from SNF after being noted to be less responsive with decrease in p.o. intake (10 lb weight loss in 6 months). CXR No active disease. Cardiomegaly.  Subjective: Pt seen in radiology for MBS. Niece present Assessment / Plan / Recommendation CHL IP CLINICAL IMPRESSIONS 11/19/2017 Clinical Impression Pt presents with absent oral response to po presentations of nectar thick liquid or puree. Pt exhibited no awareness of bolus, or attempt to manipulate either consistency. Minimal posterior spillage to the level of the vallecula was observed, however, no swallow reflex  was triggered, and pt was unable to follow commands to swallow volitionally.  Pt's oral cavity was cleared with moistened swabs and suction, and still no swallow reflex was initiated. At this time, recommend Palliative Care consult to facilitate establishment of appropriate goals of care. If continued po intake with known high aspiration risk is what family wishes, recommend conservative diet of puree and pudding thick liquids only if pt is awake and alert and demonstrating swallow reflex (present on bedside swallow assessment). SLP will follow briefly for education regarding strategies to mitigate aspiration risk.  SLP Visit Diagnosis Dysphagia, oropharyngeal phase (R13.12) Impact on safety and function Moderate aspiration risk;Severe  aspiration risk;Risk for inadequate nutrition/hydration   CHL IP TREATMENT RECOMMENDATION 11/18/2017 Treatment Recommendations Therapy as outlined in treatment plan below   Prognosis 11/19/2017 Prognosis for Safe Diet Advancement Fair Barriers to Reach Goals Cognitive deficits CHL IP DIET RECOMMENDATION 11/18/2017 SLP Diet Recommendations Pending establishment of [place] with Palliative Care Compensations Slow rate;Minimize environmental distractions;Small sips/bites;Lingual sweep for clearance of pocketing   CHL IP OTHER RECOMMENDATIONS 11/19/2017 Recommended Consults Palliative Care consult Oral Care Recommendations Oral care QID   CHL IP FOLLOW UP RECOMMENDATIONS 11/19/2017 Follow up Recommendations 24 hour supervision/assistance   CHL IP FREQUENCY AND DURATION 11/18/2017 Speech Therapy Frequency (ACUTE ONLY) min 2x/week Treatment Duration 2 weeks      CHL IP ORAL PHASE 11/19/2017 Oral Phase Impaired Oral - Nectar Teaspoon Impaired mastication;Reduced posterior propulsion;Holding of bolus;Premature spillage Oral - Puree Impaired mastication;Reduced posterior propulsion;Holding of bolus;Premature spillage  CHL IP PHARYNGEAL PHASE 11/19/2017 Pharyngeal Phase Impaired Pharyngeal- Nectar Teaspoon and Puree Minimal posterior spillage over tongue base to the level of the vallecular sinus.  CHL IP CERVICAL ESOPHAGEAL PHASE 11/19/2017 Cervical Esophageal Phase No swallow reflex initiated Celia B. Quentin Ore [facility], CCC-SLP Speech Language Pathologist [phone] Shonna Chock 11/19/2017, 12:36 PM               Microbiology: Recent Results (from the past 240 hour(s))  Culture, blood (routine x 2)     Status: None (Preliminary result)   Collection Time: 11/17/17  2:40 PM  Result Value Ref Range Status   Specimen Description BLOOD BLOOD RIGHT HAND  Final   Special Requests   Final    BOTTLES DRAWN AEROBIC AND ANAEROBIC Blood Culture adequate volume   Culture   Final    NO GROWTH 4 DAYS Performed at [facility], [place] [address] [place], [place] [address]    Report Status PENDING  Incomplete  Culture, blood (routine x 2)     Status: Abnormal   Collection Time: 11/17/17  2:43 PM  Result Value Ref Range Status   Specimen Description BLOOD RIGHT ANTECUBITAL  Final   Special Requests   Final    BOTTLES DRAWN AEROBIC AND ANAEROBIC Blood Culture adequate volume   Culture  Setup Time   Final    GRAM POSITIVE COCCI IN BOTH AEROBIC AND ANAEROBIC BOTTLES CRITICAL RESULT CALLED TO, READ BACK BY AND VERIFIED WITH: PHARMD M PHAM [phone] MWILSON    Culture (A)  Final    STAPHYLOCOCCUS SPECIES (COAGULASE NEGATIVE) THE SIGNIFICANCE OF ISOLATING THIS ORGANISM FROM A SINGLE SET OF BLOOD CULTURES WHEN MULTIPLE SETS ARE DRAWN IS UNCERTAIN. PLEASE NOTIFY THE MICROBIOLOGY DEPARTMENT WITHIN ONE WEEK IF SPECIATION AND SENSITIVITIES ARE REQUIRED. Performed at [facility], [place] [address] [place], [place] [address]    Report Status 11/20/2017 FINAL  Final  Blood Culture ID Panel (Reflexed)  Status: Abnormal   Collection Time: 11/17/17  2:43 PM  Result Value Ref Range Status   Enterococcus species NOT DETECTED NOT DETECTED Final   Listeria monocytogenes NOT DETECTED NOT DETECTED Final   Staphylococcus species DETECTED (A) NOT DETECTED Final    Comment: Methicillin (oxacillin) susceptible coagulase negative staphylococcus. Possible blood culture contaminant (unless isolated from more than one blood culture draw or clinical case suggests pathogenicity). No antibiotic treatment is indicated for blood  culture contaminants. CRITICAL RESULT CALLED TO, READ BACK BY AND VERIFIED WITH: Jene Every PharmD 10:30 11/18/17 ([email or web address])    Staphylococcus aureus NOT DETECTED NOT DETECTED Final   Methicillin resistance NOT DETECTED NOT DETECTED Final   Streptococcus species NOT DETECTED NOT DETECTED Final   Streptococcus agalactiae NOT DETECTED NOT DETECTED Final   Streptococcus pneumoniae NOT DETECTED NOT DETECTED Final   Streptococcus  pyogenes NOT DETECTED NOT DETECTED Final   Acinetobacter baumannii NOT DETECTED NOT DETECTED Final   Enterobacteriaceae species NOT DETECTED NOT DETECTED Final   Enterobacter cloacae complex NOT DETECTED NOT DETECTED Final   Escherichia coli NOT DETECTED NOT DETECTED Final   Klebsiella oxytoca NOT DETECTED NOT DETECTED Final   Klebsiella pneumoniae NOT DETECTED NOT DETECTED Final   Proteus species NOT DETECTED NOT DETECTED Final   Serratia marcescens NOT DETECTED NOT DETECTED Final   Haemophilus influenzae NOT DETECTED NOT DETECTED Final   Neisseria meningitidis NOT DETECTED NOT DETECTED Final   Pseudomonas aeruginosa NOT DETECTED NOT DETECTED Final   Candida albicans NOT DETECTED NOT DETECTED Final   Candida glabrata NOT DETECTED NOT DETECTED Final   Candida krusei NOT DETECTED NOT DETECTED Final   Candida parapsilosis NOT DETECTED NOT DETECTED Final   Candida tropicalis NOT DETECTED NOT DETECTED Final    Comment: Performed at [facility], [address] [place], [place] [address]  Culture, Urine     Status: Abnormal   Collection Time: 11/17/17  3:15 PM  Result Value Ref Range Status   Specimen Description URINE, RANDOM  Final   Special Requests ADDED ON 11/18/17 AT 1639  Final   Culture (A)  Final    <10,000 COLONIES/mL INSIGNIFICANT GROWTH Performed at [facility], [place] [address], [place], [place] [address]    Report Status 11/19/2017 FINAL  Final  MRSA PCR Screening     Status: None   Collection Time: 11/18/17 11:21 AM  Result Value Ref Range Status   MRSA by PCR NEGATIVE NEGATIVE Final    Comment:        The GeneXpert MRSA Assay (FDA approved for NASAL specimens only), is one component of a comprehensive MRSA colonization surveillance program. It is not intended to diagnose MRSA infection nor to guide or monitor treatment for MRSA infections. Performed at [facility], [place] [address] [place], [place] [address]      Labs: Basic Metabolic  Panel: Recent Labs  Lab 11/17/17 1803  11/19/17 0641 11/19/17 1105 11/19/17 1431 11/20/17 1016 11/21/17 0736  NA 165*   < > 153* 153* 152* 146* 143  K 3.8   < > 3.4* 3.4* 3.7 3.0* 3.4*  CL >130*   < > 121* 123* 120* 117* 114*  CO2 26   < > 24 24 25 25 24   GLUCOSE 144*   < > 303* 242* 243* 163* 205*  BUN 25*   < > 16 15 17 11 8   CREATININE 0.81   < > 0.72 0.72 0.74 0.59 0.64  CALCIUM 7.9*   < >  7.4* 7.5* 7.5* 7.1* 7.1*  MG 2.4  --   --   --   --   --   --   PHOS 2.9  --   --   --   --   --   --    < > = values in this interval not displayed.   Liver Function Tests: Recent Labs  Lab 11/17/17 1405  AST 18  ALT 17  ALKPHOS 66  BILITOT 0.3  PROT 6.2*  ALBUMIN 2.6*   Recent Labs  Lab 11/17/17 1405  LIPASE 25   No results for input(s): AMMONIA in the last 168 hours. CBC: Recent Labs  Lab 11/17/17 1405 11/17/17 1418 11/18/17 0626  WBC 7.7  --  7.3  NEUTROABS 5.1  --   --   HGB 11.7* 11.6* 10.8*  HCT 39.4 34.0* 37.1  MCV 101.8*  --  103.1*  PLT 238  --  140*   Cardiac Enzymes: Recent Labs  Lab 11/17/17 1405  TROPONINI <0.03   BNP:  CBG: Recent Labs  Lab 11/20/17 0720 11/20/17 1143 11/20/17 1638 11/20/17 2057 11/21/17 0730  GLUCAP 155* 155* 146* 196* 190*       Signed:  Oswald Hillock MD.  [facility] 11/21/2017, 12:00 PM

## 2017-11-22 LAB — CULTURE, BLOOD (ROUTINE X 2)
Culture: NO GROWTH
Special Requests: ADEQUATE

## 2017-11-24 ENCOUNTER — Non-Acute Institutional Stay: Payer: Self-pay | Admitting: Hospice and Palliative Medicine

## 2017-11-24 DIAGNOSIS — Z515 Encounter for palliative care: Secondary | ICD-10-CM

## 2017-11-25 NOTE — Progress Notes (Signed)
Patient pending admission for hospice services at SNF later today.

## 2019-03-18 DEATH — deceased
